# Patient Record
Sex: Female | Born: 1950 | Race: White | Hispanic: No | Marital: Married | Smoking: Never smoker
Health system: Southern US, Community
[De-identification: ages and names within clinical notes are randomized; demographics above are authoritative.]

## PROBLEM LIST (undated history)

## (undated) DIAGNOSIS — R079 Chest pain, unspecified: Secondary | ICD-10-CM

## (undated) DIAGNOSIS — E876 Hypokalemia: Secondary | ICD-10-CM

## (undated) DIAGNOSIS — M1990 Unspecified osteoarthritis, unspecified site: Secondary | ICD-10-CM

## (undated) DIAGNOSIS — I5189 Other ill-defined heart diseases: Secondary | ICD-10-CM

## (undated) HISTORY — PX: KNEE SURGERY: SHX244

## (undated) HISTORY — PX: DIAGNOSTIC LAPAROSCOPY: SUR761

## (undated) HISTORY — PX: ROTATOR CUFF REPAIR: SHX139

---

## 1998-12-14 ENCOUNTER — Other Ambulatory Visit: Admission: RE | Admit: 1998-12-14 | Discharge: 1998-12-14 | Payer: Self-pay | Admitting: Obstetrics & Gynecology

## 1999-09-30 ENCOUNTER — Encounter: Admission: RE | Admit: 1999-09-30 | Discharge: 1999-09-30 | Payer: Self-pay | Admitting: Internal Medicine

## 1999-09-30 ENCOUNTER — Encounter: Payer: Self-pay | Admitting: Internal Medicine

## 1999-12-07 ENCOUNTER — Other Ambulatory Visit: Admission: RE | Admit: 1999-12-07 | Discharge: 1999-12-07 | Payer: Self-pay | Admitting: Obstetrics and Gynecology

## 2000-11-13 ENCOUNTER — Encounter (INDEPENDENT_AMBULATORY_CARE_PROVIDER_SITE_OTHER): Payer: Self-pay | Admitting: Specialist

## 2000-11-13 ENCOUNTER — Other Ambulatory Visit: Admission: RE | Admit: 2000-11-13 | Discharge: 2000-11-13 | Payer: Self-pay | Admitting: Obstetrics and Gynecology

## 2001-02-01 ENCOUNTER — Other Ambulatory Visit: Admission: RE | Admit: 2001-02-01 | Discharge: 2001-02-01 | Payer: Self-pay | Admitting: Obstetrics and Gynecology

## 2001-04-12 ENCOUNTER — Encounter: Payer: Self-pay | Admitting: Internal Medicine

## 2001-04-12 ENCOUNTER — Encounter: Admission: RE | Admit: 2001-04-12 | Discharge: 2001-04-12 | Payer: Self-pay | Admitting: Internal Medicine

## 2002-02-03 ENCOUNTER — Other Ambulatory Visit: Admission: RE | Admit: 2002-02-03 | Discharge: 2002-02-03 | Payer: Self-pay | Admitting: Obstetrics and Gynecology

## 2003-01-05 ENCOUNTER — Encounter: Admission: RE | Admit: 2003-01-05 | Discharge: 2003-01-05 | Payer: Self-pay | Admitting: Internal Medicine

## 2003-01-05 ENCOUNTER — Encounter: Payer: Self-pay | Admitting: Internal Medicine

## 2003-09-15 ENCOUNTER — Other Ambulatory Visit: Admission: RE | Admit: 2003-09-15 | Discharge: 2003-09-15 | Payer: Self-pay | Admitting: Obstetrics and Gynecology

## 2004-03-04 ENCOUNTER — Encounter: Admission: RE | Admit: 2004-03-04 | Discharge: 2004-03-04 | Payer: Self-pay | Admitting: Internal Medicine

## 2004-11-10 ENCOUNTER — Ambulatory Visit (HOSPITAL_COMMUNITY): Admission: RE | Admit: 2004-11-10 | Discharge: 2004-11-10 | Payer: Self-pay | Admitting: Orthopedic Surgery

## 2004-11-23 ENCOUNTER — Ambulatory Visit (HOSPITAL_BASED_OUTPATIENT_CLINIC_OR_DEPARTMENT_OTHER): Admission: RE | Admit: 2004-11-23 | Discharge: 2004-11-23 | Payer: Self-pay | Admitting: Orthopedic Surgery

## 2004-11-23 ENCOUNTER — Ambulatory Visit (HOSPITAL_COMMUNITY): Admission: RE | Admit: 2004-11-23 | Discharge: 2004-11-23 | Payer: Self-pay | Admitting: Orthopedic Surgery

## 2004-11-23 ENCOUNTER — Encounter (INDEPENDENT_AMBULATORY_CARE_PROVIDER_SITE_OTHER): Payer: Self-pay | Admitting: *Deleted

## 2005-01-05 ENCOUNTER — Ambulatory Visit (HOSPITAL_COMMUNITY): Admission: RE | Admit: 2005-01-05 | Discharge: 2005-01-05 | Payer: Self-pay | Admitting: *Deleted

## 2005-05-01 ENCOUNTER — Encounter: Admission: RE | Admit: 2005-05-01 | Discharge: 2005-05-01 | Payer: Self-pay | Admitting: Internal Medicine

## 2005-07-10 ENCOUNTER — Other Ambulatory Visit: Admission: RE | Admit: 2005-07-10 | Discharge: 2005-07-10 | Payer: Self-pay | Admitting: Obstetrics and Gynecology

## 2005-11-05 ENCOUNTER — Encounter: Admission: RE | Admit: 2005-11-05 | Discharge: 2005-11-05 | Payer: Self-pay | Admitting: Orthopedic Surgery

## 2005-11-15 ENCOUNTER — Encounter (INDEPENDENT_AMBULATORY_CARE_PROVIDER_SITE_OTHER): Payer: Self-pay | Admitting: *Deleted

## 2005-11-15 ENCOUNTER — Ambulatory Visit (HOSPITAL_COMMUNITY): Admission: RE | Admit: 2005-11-15 | Discharge: 2005-11-15 | Payer: Self-pay | Admitting: Obstetrics and Gynecology

## 2006-08-09 ENCOUNTER — Encounter: Admission: RE | Admit: 2006-08-09 | Discharge: 2006-08-09 | Payer: Self-pay | Admitting: Internal Medicine

## 2008-01-22 ENCOUNTER — Encounter: Admission: RE | Admit: 2008-01-22 | Discharge: 2008-01-22 | Payer: Self-pay | Admitting: Obstetrics and Gynecology

## 2009-09-02 ENCOUNTER — Encounter: Admission: RE | Admit: 2009-09-02 | Discharge: 2009-09-02 | Payer: Self-pay | Admitting: Obstetrics and Gynecology

## 2010-04-25 ENCOUNTER — Encounter: Admission: RE | Admit: 2010-04-25 | Discharge: 2010-04-25 | Payer: Self-pay | Admitting: Internal Medicine

## 2010-09-09 ENCOUNTER — Encounter
Admission: RE | Admit: 2010-09-09 | Discharge: 2010-09-09 | Payer: Self-pay | Source: Home / Self Care | Attending: Obstetrics and Gynecology | Admitting: Obstetrics and Gynecology

## 2011-01-20 NOTE — Op Note (Signed)
NAMEGEENA, WEINHOLD NO.:  [id number]   MEDICAL RECORD NO.:  [id number]          PATIENT TYPE:  AMB   LOCATION:  ENDO                         FACILITY:  [facility]   PHYSICIAN:  Georgiana Spinner, M.D.    DATE OF BIRTH:  10-22-1950   DATE OF PROCEDURE:  DATE OF DISCHARGE:                                 OPERATIVE REPORT   PROCEDURE:  Colonoscopy.   INDICATIONS:  Colon cancer screening.   ANESTHESIA:  Demerol 40, Versed 4 mg.   PROCEDURE:  With the patient mildly sedated in the left lateral decubitus  position, the Olympus videoscopic colonoscope was inserted in the rectum,  passed through a somewhat tortuous colon until reached the cecum, identified  by ileocecal valve and appendiceal orifice both of which were photographed.  From this point, the colonoscope was slowly withdrawn taking circumferential  views of the colonic mucosa stopping only in the rectum which appeared  normal on direct and showed hemorrhoids on retroflexed view.  The endoscope  was straightened and withdrawn.  The patient's vital signs and pulse  oximeter remained stable.  The patient tolerated procedure well without  apparent complications.   FINDINGS:  Internal hemorrhoids otherwise an unremarkable colonoscopic  examination to the cecum.   PLAN:  Consider repeat examination in 5-10 years.      GMO/MEDQ  D:  01/05/2005  T:  01/05/2005  Job:  [id number]

## 2011-01-20 NOTE — Op Note (Signed)
[name], SOBIECH NO.:  [id number]   MEDICAL RECORD NO.:  [id number]          PATIENT TYPE:  AMB   LOCATION:  ENDO                         FACILITY:  [facility]   PHYSICIAN:  Georgiana Spinner, M.D.    DATE OF BIRTH:  1951/01/22   DATE OF PROCEDURE:  01/05/2005  DATE OF DISCHARGE:                                 OPERATIVE REPORT   PROCEDURE:  Upper endoscopy.   INDICATIONS:  GERD.   ANESTHESIA:  Demerol 60 mg and Versed 6 mg.   DESCRIPTION OF PROCEDURE:  With the patient mildly sedated in the left  lateral decubitus position, the Olympus videoscope endoscope was inserted in  the mouth and passed under direct vision through the esophagus, which  appeared normal, into the stomach.  The fundus, body, antrum, duodenal bulb  and second portion of the duodenum appeared normal.  From this point, the  endoscope was slowly withdrawn, taking circumferential views of the duodenal  mucosa until the endoscope was pulled back into the stomach and placed in  retroflexion to review the stomach from below.  The endoscope was  straightened and withdrawn, taking circumferential views of the remaining  gastric and esophageal mucosa.  The patient's vital signs and pulse oximetry  remained stable.  The patient tolerated the procedure well without apparent  complications.   FINDINGS:  Unremarkable examination.  No evidence of Barrett's esophagus.   PLAN:  Proceed to colonoscopy.      GMO/MEDQ  D:  01/05/2005  T:  01/05/2005  Job:  [id number]

## 2011-01-20 NOTE — H&P (Signed)
[name], [name] NO.:  [id number]   MEDICAL RECORD NO.:  [id number]          PATIENT TYPE:  AMB   LOCATION:  SDC                           FACILITY:  [facility]   PHYSICIAN:  Huel Cote, M.D. DATE OF BIRTH:  [birth year]   DATE OF PROCEDURE:  DATE OF DISCHARGE:                      STAT - MUST CHANGE TO CORRECT WORK TYPE   HISTORY OF PRESENT ILLNESS:  The patient is a 60 year old gravida 1, para 1,  who is coming in for a scheduled laparoscopic bilateral salpingo-  oophorectomy given an ongoing problem with a persistent to right adnexal  mass which has not resolved despite followup for approximately three to four  months.  The patient currently has some mild pain associated. However, the  persistence of the mass is primary reason for removal.  On ultrasound she  has a right ovarian cyst which appears echo free.  However, it does have  some calcifications.  The patient is very active and is beginning an active  tennis season and wishes removal of this mass prior to tennis season so it  will not cause any problems with undue pain.   PAST MEDICAL HISTORY:  None.   PAST SURGICAL HISTORY:  Knee surgery x5.   PAST OB HISTORY:  Vaginal delivery x1.   PAST GYN HISTORY:  No abnormal Pap smears.   CURRENT MEDICATIONS:  Lipitor for high cholesterol.   ALLERGIES:  PENICILLIN and TETRACYCLINE.   FAMILY HISTORY:  Mother with breast cancer.  No colon cancer.   HEALTH MAINTENANCE:  Up to date.   PHYSICAL EXAMINATION:  VITAL SIGNS:  Weight 147 pounds.  Blood pressure  130/70.  BREASTS:  Normal with no masses, discharge or adenopathy noted.  CARDIAC:  Regular rate and rhythm.  LUNGS:  Clear.  ABDOMEN:  Soft, nontender.  PELVIC:  She has normal external genitalia. Cervix has no lesions.  Uterus  is normal in size.  The right adnexa does have a 4 cm mobile mass.  Left  adnexa feels normal with no masses palpable.   Since November 2006, the patient had been followed  with intermittent  ultrasounds.  However, the mass has persisted and, therefore, the patient is  requesting removal.  We discussed in a menopausal female the pluses and  minuses of retaining her other ovary.  However, the patient is not  interested in retaining either ovary and requests removal of both at the  time of surgery which will be performed as per her wishes given that she is  postmenopausal and has limited ovarian function at this time.  The risks and  benefits of surgery were discussed with the patient in detail including  bleeding, infection and possible damage to bowel and bladder. The patient  understands that should  any of these complications arise, she will require a larger abdominal  incision to correct any problems as well as should there be any uncontrolled  bleeding or difficulty with surgery, she might require surgery.  The patient  understands all these risks and desires to proceed with surgery as stated.      Huel Cote, M.D.  Electronically Signed  KR/MEDQ  D:  11/13/2005  T:  11/13/2005  Job:  [id number]

## 2011-01-20 NOTE — Op Note (Signed)
NAMEJOSETTE, SHIMABUKURO NO.:  [id number]   MEDICAL RECORD NO.:  [id number]          PATIENT TYPE:  AMB   LOCATION:  SDC                           FACILITY:  [facility]   PHYSICIAN:  Huel Cote, M.D. DATE OF BIRTH:  1951-03-20   DATE OF PROCEDURE:  11/15/2005  DATE OF DISCHARGE:                                 OPERATIVE REPORT   PREOPERATIVE DIAGNOSIS:  Persistent right ovarian cyst.   POSTOPERATIVE DIAGNOSIS:  Persistent right ovarian cyst. With the cyst  appearing simple in nature.   PROCEDURE:  Laparoscopic bilateral salpingo-oophorectomy.   SURGEON:  Dr. Huel Cote.   ASSISTANT:  Dr. Hilbert Bible   ANESTHESIA:  General.   SPECIMEN:  Right ovary with cyst was sent, left ovary was sent and both  fallopian tubes were sent.   ESTIMATED BLOOD LOSS:  Minimal   FINDINGS:  There is a large right adnexal cyst which was simple appearing in  nature approximately 5-6 cm in size. Left ovary was atrophic and normal  appearing. The abdomen and pelvis otherwise were within normal limits.   PROCEDURE:  The patient was taken to operating room where she was positioned  in stirrups prior to administration of general anesthesia which was  performed without difficulty. She was in the dorsal lithotomy position and  prepped sterilely. A speculum was placed within the vagina and cervix  identified and a Hulka tenaculum placed within for uterine manipulation. The  bladder was emptied with an in-and-out catheter. Attention was then turned  to the abdomen where a small infraumbilical incision was then made with  scalpel. The Veress needle was then introduced into the incision and  pneumoperitoneum obtained with approximately 1.5 to 2 liters CO2 gas. The  camera was then introduced into the abdomen and no active bleeding noted.  The pelvis and abdomen were inspected with the findings as previously  stated. Two additional trocars were placed in the lower quadrants 5 mm in  size under direct visualization after 0.25% Marcaine solution was injected  at each point. The trocar was then introduced without difficulty. The  patient's right adnexa was in the cul-de-sac secondary to the large cyst  weighing it down. The utero-ovarian ligament and the infundibulopelvic  ligament were then taken down with the tripolar cutting cautery unit with  good hemostasis noted. Thus the right adnexa in entirety was amputated and  placed in the cul-de-sac. In a similar fashion in the left adnexa was  retracted medially and the infundibulopelvic ligament and utero-ovarian  ligament taken down with a tripolar unit. There is no active bleeding noted  and both adnexa were completely amputated at this point. The ureters were  visible well below the field and peristalsing normally. The Endobag was then  placed through the umbilical port the 5 mm camera placed inside. The cyst  and the left adnexa were then placed into the bag and due to the size of the  cyst, it was difficult to completely cinch down the bag however, it was most  of the way secured. At this point the cyst was brought up to the  level of  the umbilicus and attempts were made to aspirate the cyst through the bag  itself and remove the fluid. This was not possible through the incision and  minimal fluid was obtained with the aspiration. Several attempts at this  were made to contain the fluid within the Endobag however, due to the size  of the cyst it was impossible to get into the cyst itself and remove all  fluid. At this point the drainage needle was replaced in through the 5 mm  port under direct visualization. The ovary was punctured through the Endobag  and the fluid was removed somewhat as well as some was definitely spilled  into the peritoneal cavity. As the ovary appeared quite simple in nature and  had no evidence of abnormalities this was not felt to be a significant  problem. The adnexa was eventually reduced  enough in size that it could be  removed through the umbilicus and the remaining ovary fragment was retrieved  through the umbilicus port as well with a second Endobag. At this point the  camera was reintroduced and all pedicles were carefully inspected. What  little fluid was in the cul-de-sac was suctioned and removed from the cul-de-  sac. At this point no active bleeding was noted and therefore both 5 mm  trocars were removed under direct visualization. No active bleeding noted.  The pneumoperitoneum was reduced. The fascia was identified and closed with  0 Vicryl at the umbilicus with no active bleeding noted and this incision  was closed quite well. The additional skin was closed with 3-0 Vicryl in a  subcuticular stitch. Sponge, lap, needle counts were correct x2 and the  patient was taken to the recovery room in stable condition after the Hulka  tenaculum was removed from her vagina.      Huel Cote, M.D.  Electronically Signed     KR/MEDQ  D:  11/15/2005  T:  11/16/2005  Job:  [id number]

## 2011-01-20 NOTE — Op Note (Signed)
[name], MATHES NO.:  [id number]   MEDICAL RECORD NO.:  [id number]          PATIENT TYPE:  AMB   LOCATION:  DSC                          FACILITY:  [facility]   PHYSICIAN:  Cindee Salt, M.D.       DATE OF BIRTH:  [birth year]   DATE OF PROCEDURE:  11/23/2004  DATE OF DISCHARGE:                                 OPERATIVE REPORT   PREOPERATIVE DIAGNOSIS:  Mass, right ring finger.   POSTOPERATIVE DIAGNOSIS:  Mass, right ring finger.   OPERATION:  Excision of mass, right ring finger.   SURGEON:  Cindee Salt, M.D.   ASSISTANTCarolyne Fiscal.   ANESTHESIA:  General.   HISTORY:  The patient is a 60 year old female with a history of a mass over  the PIP area of her right ring finger.  This has become painful for her.  MRI reveals it has being a solid mass.   PROCEDURE:  The patient is brought to the operating room where a general  anesthetic using LMA was given.  She was prepped using DuraPrep, supine  position, right arm free.  The limb was exsanguinated with an Esmarch  bandage.  Tourniquet placed high on the arm was inflated to 250 mmHg and a  transverse incision was made directly over the mass just distal to the PIP  joint and carried down through subcutaneous tissue.  A multi-lobulated, what  appeared to be blood-filled mass was immediately apparent.  This was  separated from the neurovascular bundles with blunt and sharp.  It was  dissected free and bleeding vessels were cauterized.  The entire specimen  was sent to pathology.  The wound was irrigated.  This did not involve the  flexor sheath nor the neurovascular bundle.  The skin was closed interrupted  5-0 nylon sutures.  A sterile compressive dressing was applied to the finger  only.  The patient tolerated the procedure well and was taken to the  recovery room for observation in satisfactory condition.   She is discharged home to return to the [facility] in 1 week  on Vicodin.  It was noted that  she had some feeling in the initial incision  and a metacarpal block was given with 1% Xylocaine without epinephrine; 2 mL  were used.      GK/MEDQ  D:  11/23/2004  T:  11/24/2004  Job:  [id number]

## 2013-12-09 ENCOUNTER — Other Ambulatory Visit: Payer: Self-pay

## 2013-12-09 DIAGNOSIS — Z1231 Encounter for screening mammogram for malignant neoplasm of breast: Secondary | ICD-10-CM

## 2013-12-19 ENCOUNTER — Ambulatory Visit
Admission: RE | Admit: 2013-12-19 | Discharge: 2013-12-19 | Disposition: A | Discharge status: Home / Self Care | Payer: BC Managed Care – PPO | Source: Ambulatory Visit

## 2013-12-19 DIAGNOSIS — Z1231 Encounter for screening mammogram for malignant neoplasm of breast: Secondary | ICD-10-CM

## 2014-11-04 ENCOUNTER — Other Ambulatory Visit (HOSPITAL_COMMUNITY): Payer: Self-pay | Admitting: Radiology

## 2014-11-04 DIAGNOSIS — R0600 Dyspnea, unspecified: Secondary | ICD-10-CM

## 2014-11-05 ENCOUNTER — Other Ambulatory Visit: Payer: Self-pay | Admitting: Internal Medicine

## 2014-11-05 DIAGNOSIS — M542 Cervicalgia: Secondary | ICD-10-CM

## 2014-11-06 ENCOUNTER — Ambulatory Visit (HOSPITAL_COMMUNITY)
Admission: RE | Admit: 2014-11-06 | Discharge: 2014-11-06 | Disposition: A | Discharge status: Home / Self Care | Payer: BLUE CROSS/BLUE SHIELD | Source: Ambulatory Visit | Attending: Internal Medicine | Admitting: Internal Medicine

## 2014-11-06 DIAGNOSIS — R0600 Dyspnea, unspecified: Secondary | ICD-10-CM | POA: Insufficient documentation

## 2014-11-06 MED ORDER — ALBUTEROL SULFATE (2.5 MG/3ML) 0.083% IN NEBU
2.5000 mg | INHALATION_SOLUTION | Freq: Once | RESPIRATORY_TRACT | Status: AC
  Administered 2014-11-06: 2.5 mg via RESPIRATORY_TRACT

## 2014-11-09 ENCOUNTER — Ambulatory Visit
Admission: RE | Admit: 2014-11-09 | Discharge: 2014-11-09 | Disposition: A | Discharge status: Home / Self Care | Payer: BLUE CROSS/BLUE SHIELD | Source: Ambulatory Visit | Attending: Internal Medicine | Admitting: Internal Medicine

## 2014-11-09 DIAGNOSIS — M542 Cervicalgia: Secondary | ICD-10-CM

## 2014-11-09 LAB — PULMONARY FUNCTION TEST
DL/VA % PRED: 73 %
DL/VA: 3.8 ml/min/mmHg/L
DLCO UNC % PRED: 71 %
DLCO unc: 20.3 ml/min/mmHg
FEF 25-75 POST: 2.83 L/s
FEF 25-75 PRE: 2.55 L/s
FEF2575-%CHANGE-POST: 11 %
FEF2575-%PRED-POST: 116 %
FEF2575-%PRED-PRE: 105 %
FEV1-%CHANGE-POST: 2 %
FEV1-%PRED-PRE: 96 %
FEV1-%Pred-Post: 98 %
FEV1-POST: 2.74 L
FEV1-PRE: 2.68 L
FEV1FVC-%Change-Post: 4 %
FEV1FVC-%PRED-PRE: 102 %
FEV6-%Change-Post: -1 %
FEV6-%PRED-POST: 93 %
FEV6-%Pred-Pre: 95 %
FEV6-POST: 3.27 L
FEV6-Pre: 3.33 L
FEV6FVC-%CHANGE-POST: 0 %
FEV6FVC-%PRED-PRE: 104 %
FEV6FVC-%Pred-Post: 104 %
FVC-%Change-Post: -2 %
FVC-%PRED-POST: 90 %
FVC-%Pred-Pre: 92 %
FVC-POST: 3.27 L
FVC-Pre: 3.34 L
POST FEV1/FVC RATIO: 84 %
PRE FEV1/FVC RATIO: 80 %
Post FEV6/FVC ratio: 100 %
Pre FEV6/FVC Ratio: 100 %
RV % pred: 110 %
RV: 2.41 L
TLC % pred: 103 %
TLC: 5.7 L

## 2015-01-29 ENCOUNTER — Other Ambulatory Visit (INDEPENDENT_AMBULATORY_CARE_PROVIDER_SITE_OTHER): Payer: Self-pay | Admitting: Otolaryngology

## 2015-01-29 DIAGNOSIS — E041 Nontoxic single thyroid nodule: Secondary | ICD-10-CM

## 2015-02-03 ENCOUNTER — Ambulatory Visit
Admission: RE | Admit: 2015-02-03 | Discharge: 2015-02-03 | Disposition: A | Discharge status: Home / Self Care | Payer: BLUE CROSS/BLUE SHIELD | Source: Ambulatory Visit | Attending: Otolaryngology | Admitting: Otolaryngology

## 2015-02-03 ENCOUNTER — Other Ambulatory Visit (INDEPENDENT_AMBULATORY_CARE_PROVIDER_SITE_OTHER): Payer: Self-pay | Admitting: Otolaryngology

## 2015-02-03 DIAGNOSIS — E041 Nontoxic single thyroid nodule: Secondary | ICD-10-CM

## 2016-06-16 NOTE — H&P (Signed)
TOTAL KNEE ADMISSION H&P     Patient is being admitted for bilateral total knee arthroplasty.  Subjective:  Chief Complaint:   Bilateral knee primary OA / pain  HPI: [name], 65 y.o. female, has a history of pain and functional disability in the bilateral knees due to trauma and arthritis and has failed non-surgical conservative treatments for greater than 12 weeks to include NSAID's and/or analgesics, corticosteriod injections and activity modification.  Onset of symptoms was gradual, starting 30+ years ago with gradually worsening course since that time. The patient noted prior procedures on the knee to include  arthroscopy, menisectomy, ACL reconstruction and multiple others on the bilateral knees.  Patient currently rates pain in the bilateral knee(s) at 7 out of 10 with activity. Patient has night pain, worsening of pain with activity and weight bearing, pain that interferes with activities of daily living, pain with passive range of motion, crepitus and joint swelling.  Patient has evidence of periarticular osteophytes and joint space narrowing by imaging studies.  There is no active infection.  Risks, benefits and expectations were discussed with the patient.  Risks including but not limited to the risk of anesthesia, blood clots, nerve damage, blood vessel damage, failure of the prosthesis, infection and up to and including death.  Patient understand the risks, benefits and expectations and wishes to proceed with surgery.   PCP: [name], MD  D/C Plans:      Home with HHPT  Post-op Meds:       No Rx given  Tranexamic Acid:      To be given - IV   Decadron:      Is to be given  FYI:     Xarelto then ASA (bilateral)  Oxycodone (bilateral)     Past Medical History:  Diagnosis Date  . Arthritis    osteoarthritis knees mostly    Past Surgical History:  Procedure Laterality Date  . DIAGNOSTIC LAPAROSCOPY     ovarian cystectomy/ BSO done.  Marland Kitchen KNEE SURGERY Bilateral    ACL x2, meniscectomy x3, open patella(bone from other knee)  . ROTATOR CUFF REPAIR Right     No prescriptions prior to admission.   Allergies  Allergen Reactions  . Penicillins Anaphylaxis and Swelling    Has patient had a PCN reaction causing immediate rash, facial/tongue/throat swelling, SOB or lightheadedness with hypotension:Yes Has patient had a PCN reaction causing severe rash involving mucus membranes or skin necrosis:No Has patient had a PCN reaction that required hospitalization:No Has patient had a PCN reaction occurring within the last 10 years:No If all of the above answers are "NO", then may proceed with Cephalosporin use.   . Tetracyclines & Related Hives     Social History  Substance Use Topics  . Smoking status: Never Smoker  . Smokeless tobacco: Never Used  . Alcohol use Yes     Comment: social x3-4 week       Review of Systems  Constitutional: Negative.   HENT: Negative.   Eyes: Negative.   Respiratory: Negative.   Cardiovascular: Negative.   Gastrointestinal: Negative.   Genitourinary: Negative.   Musculoskeletal: Positive for joint pain.  Skin: Negative.   Neurological: Negative.   Endo/Heme/Allergies: Negative.   Psychiatric/Behavioral: Negative.     Objective:  Physical Exam  Constitutional: She is oriented to person, place, and time. She appears well-developed.  HENT:  Head: Normocephalic.  Eyes: Pupils are equal, round, and reactive to light.  Neck: Neck supple. No JVD present. No tracheal  deviation present. No thyromegaly present.  Cardiovascular: Normal rate, regular rhythm, normal heart sounds and intact distal pulses.   Respiratory: Effort normal and breath sounds normal. No respiratory distress. She has no wheezes.  GI: Soft. There is no tenderness. There is no guarding.  Musculoskeletal:       Right knee: She exhibits decreased range of motion, swelling and bony tenderness. She exhibits no ecchymosis, no deformity, no laceration and  no erythema. Tenderness found.       Left knee: She exhibits decreased range of motion, swelling and bony tenderness. She exhibits no ecchymosis, no deformity, no laceration and no erythema. Tenderness found.  Lymphadenopathy:    She has no cervical adenopathy.  Neurological: She is alert and oriented to person, place, and time.  Skin: Skin is warm and dry.  Psychiatric: She has a normal mood and affect.      Imaging Review Plain radiographs demonstrate severe degenerative joint disease of the bilateral knees. The overall alignment is neutral. The bone quality appears to be good for age and reported activity level.  Assessment/Plan:  End stage arthritis, bilateral knees  The patient history, physical examination, clinical judgment of the provider and imaging studies are consistent with end stage degenerative joint disease of the bilateral knees and total knee arthroplasty is deemed medically necessary. The treatment options including medical management, injection therapy arthroscopy and arthroplasty were discussed at length. The risks and benefits of total knee arthroplasty were presented and reviewed. The risks due to aseptic loosening, infection, stiffness, patella tracking problems, thromboembolic complications and other imponderables were discussed. The patient acknowledged the explanation, agreed to proceed with the plan and consent was signed. Patient is being admitted for inpatient treatment for surgery, pain control, PT, OT, prophylactic antibiotics, VTE prophylaxis, progressive ambulation and ADL's and discharge planning. The patient is planning to be discharged home with home health services.      [place] P[place] Reve Crocket   PA-C  06/30/2016, 8:35 AM

## 2016-06-26 NOTE — Patient Instructions (Addendum)
[name]  06/26/2016   Your procedure is scheduled on: 07-03-16  Report to [place]  Entrance take [facility]  elevators to 3rd floor to  [place] at   [place] AM.  Call this number if you have problems the morning of surgery [phone]   Remember: ONLY 1 PERSON MAY GO WITH YOU TO SHORT STAY TO GET  READY MORNING OF [name]  Do not eat food or drink liquids :After Midnight.     Take these medicines the morning of surgery with A SIP OF WATER: None. DO NOT TAKE ANY DIABETIC MEDICATIONS DAY OF YOUR SURGERY                               You may not have any metal on your body including hair pins and              piercings  Do not wear jewelry, make-up, lotions, powders or perfumes, deodorant             Do not wear nail polish.  Do not shave  48 hours prior to surgery.              Men may shave face and neck.   Do not bring valuables to the hospital. [place].  Contacts, dentures or bridgework may not be worn into surgery.  Leave suitcase in the car. After surgery it may be brought to your room.     Patients discharged the day of surgery will not be allowed to drive home.  Name and phone number of your driver: [name] [phone] cell  Special Instructions: N/A              Please read over the following fact sheets you were given: _____________________________________________________________________             [facility] - Preparing for Surgery Before surgery, you can play an important role.  Because skin is not sterile, your skin needs to be as free of germs as possible.  You can reduce the number of germs on your skin by washing with CHG (chlorahexidine gluconate) soap before surgery.  CHG is an antiseptic cleaner which kills germs and bonds with the skin to continue killing germs even after washing. Please DO NOT use if you have an allergy to CHG or antibacterial soaps.  If  your skin becomes reddened/irritated stop using the CHG and inform your nurse when you arrive at Short Stay. Do not shave (including legs and underarms) for at least 48 hours prior to the first CHG shower.  You may shave your face/neck. Please follow these instructions carefully:  1.  Shower with CHG Soap the night before surgery and the  morning of Surgery.  2.  If you choose to wash your hair, wash your hair first as usual with your  normal  shampoo.  3.  After you shampoo, rinse your hair and body thoroughly to remove the  shampoo.                           4.  Use CHG as you would any other liquid soap.  You can apply chg directly  to the  skin and wash                       Gently with a scrungie or clean washcloth.  5.  Apply the CHG Soap to your body ONLY FROM THE NECK DOWN.   Do not use on face/ open                           Wound or open sores. Avoid contact with eyes, ears mouth and genitals (private parts).                       Wash face,  Genitals (private parts) with your normal soap.             6.  Wash thoroughly, paying special attention to the area where your surgery  will be performed.  7.  Thoroughly rinse your body with warm water from the neck down.  8.  DO NOT shower/wash with your normal soap after using and rinsing off  the CHG Soap.                9.  Pat yourself dry with a clean towel.            10.  Wear clean pajamas.            11.  Place clean sheets on your bed the night of your first shower and do not  sleep with pets. Day of Surgery : Do not apply any lotions/deodorants the morning of surgery.  Please wear clean clothes to the hospital/surgery center.  FAILURE TO FOLLOW THESE INSTRUCTIONS MAY RESULT IN THE CANCELLATION OF YOUR SURGERY PATIENT SIGNATURE_________________________________  NURSE SIGNATURE__________________________________  ________________________________________________________________________   [name]  An incentive  spirometer is a tool that can help keep your lungs clear and active. This tool measures how well you are filling your lungs with each breath. Taking long deep breaths may help reverse or decrease the chance of developing breathing (pulmonary) problems (especially infection) following:  A long period of time when you are unable to move or be active. BEFORE THE PROCEDURE   If the spirometer includes an indicator to show your best effort, your nurse or respiratory therapist will set it to a desired goal.  If possible, sit up straight or lean slightly forward. Try not to slouch.  Hold the incentive spirometer in an upright position. INSTRUCTIONS FOR USE  1. Sit on the edge of your bed if possible, or sit up as far as you can in bed or on a chair. 2. Hold the incentive spirometer in an upright position. 3. Breathe out normally. 4. Place the mouthpiece in your mouth and seal your lips tightly around it. 5. Breathe in slowly and as deeply as possible, raising the piston or the ball toward the top of the column. 6. Hold your breath for 3-5 seconds or for as long as possible. Allow the piston or ball to fall to the bottom of the column. 7. Remove the mouthpiece from your mouth and breathe out normally. 8. Rest for a few seconds and repeat Steps 1 through 7 at least 10 times every 1-2 hours when you are awake. Take your time and take a few normal breaths between deep breaths. 9. The spirometer may include an indicator to show your best effort. Use the indicator as a goal to work toward during each repetition. 10. After each set of  10 deep breaths, practice coughing to be sure your lungs are clear. If you have an incision (the cut made at the time of surgery), support your incision when coughing by placing a pillow or rolled up towels firmly against it. Once you are able to get out of bed, walk around indoors and cough well. You may stop using the incentive spirometer when instructed by your caregiver.   RISKS AND COMPLICATIONS  Take your time so you do not get dizzy or light-headed.  If you are in pain, you may need to take or ask for pain medication before doing incentive spirometry. It is harder to take a deep breath if you are having pain. AFTER USE  Rest and breathe slowly and easily.  It can be helpful to keep track of a log of your progress. Your caregiver can provide you with a simple table to help with this. If you are using the spirometer at home, follow these instructions: [place] IF:   You are having difficultly using the spirometer.  You have trouble using the spirometer as often as instructed.  Your pain medication is not giving enough relief while using the spirometer.  You develop fever of 100.5 F (38.1 C) or higher. SEEK IMMEDIATE MEDICAL CARE IF:   You cough up bloody sputum that had not been present before.  You develop fever of 102 F (38.9 C) or greater.  You develop worsening pain at or near the incision site. MAKE SURE YOU:   Understand these instructions.  Will watch your condition.  Will get help right away if you are not doing well or get worse. Document Released: 01/01/2007 Document Revised: 11/13/2011 Document Reviewed: 03/04/2007 Exit[employer] Patient Information 2014 [employer], [place].   ________________________________________________________________________  WHAT IS A BLOOD TRANSFUSION? Blood Transfusion Information  A transfusion is the replacement of blood or some of its parts. Blood is made up of multiple cells which provide different functions.  Red blood cells carry oxygen and are used for blood loss replacement.  White blood cells fight against infection.  Platelets control bleeding.  Plasma helps clot blood.  Other blood products are available for specialized needs, such as hemophilia or other clotting disorders. BEFORE THE TRANSFUSION  Who gives blood for transfusions?   Healthy volunteers who are fully evaluated  to make sure their blood is safe. This is blood bank blood. Transfusion therapy is the safest it has ever been in the practice of medicine. Before blood is taken from a donor, a complete history is taken to make sure that person has no history of diseases nor engages in risky social behavior (examples are intravenous drug use or sexual activity with multiple partners). The donor's travel history is screened to minimize risk of transmitting infections, such as malaria. The donated blood is tested for signs of infectious diseases, such as HIV and hepatitis. The blood is then tested to be sure it is compatible with you in order to minimize the chance of a transfusion reaction. If you or a relative donates blood, this is often done in anticipation of surgery and is not appropriate for emergency situations. It takes many days to process the donated blood. RISKS AND COMPLICATIONS Although transfusion therapy is very safe and saves many lives, the main dangers of transfusion include:   Getting an infectious disease.  Developing a transfusion reaction. This is an allergic reaction to something in the blood you were given. Every precaution is taken to prevent this. The decision to have a blood  transfusion has been considered carefully by your caregiver before blood is given. Blood is not given unless the benefits outweigh the risks. AFTER THE TRANSFUSION  Right after receiving a blood transfusion, you will usually feel much better and more energetic. This is especially true if your red blood cells have gotten low (anemic). The transfusion raises the level of the red blood cells which carry oxygen, and this usually causes an energy increase.  The nurse administering the transfusion will monitor you carefully for complications. HOME CARE INSTRUCTIONS  No special instructions are needed after a transfusion. You may find your energy is better. Speak with your caregiver about any limitations on activity for  underlying diseases you may have. SEEK MEDICAL CARE IF:   Your condition is not improving after your transfusion.  You develop redness or irritation at the intravenous (IV) site. SEEK IMMEDIATE MEDICAL CARE IF:  Any of the following symptoms occur over the next 12 hours:  Shaking chills.  You have a temperature by mouth above 102 F (38.9 C), not controlled by medicine.  Chest, back, or muscle pain.  People around you feel you are not acting correctly or are confused.  Shortness of breath or difficulty breathing.  Dizziness and fainting.  You get a rash or develop hives.  You have a decrease in urine output.  Your urine turns a dark color or changes to pink, red, or brown. Any of the following symptoms occur over the next 10 days:  You have a temperature by mouth above 102 F (38.9 C), not controlled by medicine.  Shortness of breath.  Weakness after normal activity.  The white part of the eye turns yellow (jaundice).  You have a decrease in the amount of urine or are urinating less often.  Your urine turns a dark color or changes to pink, red, or brown. Document Released: 08/18/2000 Document Revised: 11/13/2011 Document Reviewed: 04/06/2008 [facility] Patient Information 2014 [place], [place].  _______________________________________________________________________

## 2016-06-27 ENCOUNTER — Encounter (HOSPITAL_COMMUNITY)
Admission: RE | Admit: 2016-06-27 | Discharge: 2016-06-27 | Disposition: A | Discharge status: Home / Self Care | Payer: BLUE CROSS/BLUE SHIELD | Source: Ambulatory Visit | Attending: Orthopedic Surgery | Admitting: Orthopedic Surgery

## 2016-06-27 ENCOUNTER — Encounter (HOSPITAL_COMMUNITY): Payer: Self-pay

## 2016-06-27 DIAGNOSIS — M170 Bilateral primary osteoarthritis of knee: Secondary | ICD-10-CM | POA: Diagnosis not present

## 2016-06-27 DIAGNOSIS — Z01812 Encounter for preprocedural laboratory examination: Secondary | ICD-10-CM | POA: Insufficient documentation

## 2016-06-27 HISTORY — DX: Unspecified osteoarthritis, unspecified site: M19.90

## 2016-06-27 LAB — CBC
HEMATOCRIT: 44.5 % (ref 36.0–46.0)
Hemoglobin: 14.8 g/dL (ref 12.0–15.0)
MCH: 30.6 pg (ref 26.0–34.0)
MCHC: 33.3 g/dL (ref 30.0–36.0)
MCV: 92.1 fL (ref 78.0–100.0)
PLATELETS: 211 10*3/uL (ref 150–400)
RBC: 4.83 MIL/uL (ref 3.87–5.11)
RDW: 12.6 % (ref 11.5–15.5)
WBC: 5.6 10*3/uL (ref 4.0–10.5)

## 2016-06-27 LAB — SURGICAL PCR SCREEN
MRSA, PCR: NEGATIVE
STAPHYLOCOCCUS AUREUS: POSITIVE — AB

## 2016-06-27 LAB — ABO/RH: ABO/RH(D): A POS

## 2016-06-27 NOTE — Pre-Procedure Instructions (Signed)
06-27-16 1610 Positive Staph aureus -pt. Aware to use Mupirocin Ointment as directed. Message to Dr. Aurea Graff office pod- [phone].

## 2016-06-27 NOTE — Pre-Procedure Instructions (Signed)
EKG 9'17 with chart . Clearance note Dr. Maudie Mercury with chart 05-16-16.

## 2016-07-02 NOTE — Anesthesia Preprocedure Evaluation (Addendum)
Anesthesia Evaluation  Patient identified by MRN, date of birth, ID band Patient awake    Reviewed: Allergy & Precautions, H&P , NPO status , Patient's Chart, lab work & pertinent test results  Airway Mallampati: II  TM Distance: >3 FB Neck ROM: Full    Dental no notable dental hx. (+) Teeth Intact, Dental Advisory Given   Pulmonary neg pulmonary ROS,    Pulmonary exam normal breath sounds clear to auscultation       Cardiovascular negative cardio ROS   Rhythm:Regular Rate:Normal     Neuro/Psych negative neurological ROS  negative psych ROS   GI/Hepatic negative GI ROS, Neg liver ROS,   Endo/Other  negative endocrine ROS  Renal/GU negative Renal ROS  negative genitourinary   Musculoskeletal  (+) Arthritis , Osteoarthritis,    Abdominal   Peds  Hematology negative hematology ROS (+)   Anesthesia Other Findings   Reproductive/Obstetrics negative OB ROS                            Anesthesia Physical Anesthesia Plan  ASA: II  Anesthesia Plan: MAC and Spinal   Post-op Pain Management:    Induction: Intravenous  Airway Management Planned: Simple Face Mask  Additional Equipment:   Intra-op Plan:   Post-operative Plan:   Informed Consent: I have reviewed the patients History and Physical, chart, labs and discussed the procedure including the risks, benefits and alternatives for the proposed anesthesia with the patient or authorized representative who has indicated his/her understanding and acceptance.   Dental advisory given  Plan Discussed with: CRNA  Anesthesia Plan Comments:         Anesthesia Quick Evaluation

## 2016-07-03 ENCOUNTER — Encounter (HOSPITAL_COMMUNITY): Payer: Self-pay | Admitting: *Deleted

## 2016-07-03 ENCOUNTER — Encounter (HOSPITAL_COMMUNITY)
Admission: RE | Disposition: A | Discharge status: Rehabilitation Facility | Payer: Self-pay | Source: Ambulatory Visit | Attending: Orthopedic Surgery

## 2016-07-03 ENCOUNTER — Inpatient Hospital Stay (HOSPITAL_COMMUNITY)
Admission: RE | Admit: 2016-07-03 | Discharge: 2016-07-07 | DRG: 462 | Disposition: A | Discharge status: Rehabilitation Facility | Payer: BLUE CROSS/BLUE SHIELD | Source: Ambulatory Visit | Attending: Orthopedic Surgery | Admitting: Orthopedic Surgery

## 2016-07-03 ENCOUNTER — Inpatient Hospital Stay (HOSPITAL_COMMUNITY): Payer: BLUE CROSS/BLUE SHIELD | Admitting: Anesthesiology

## 2016-07-03 DIAGNOSIS — M65861 Other synovitis and tenosynovitis, right lower leg: Secondary | ICD-10-CM | POA: Diagnosis present

## 2016-07-03 DIAGNOSIS — Z880 Allergy status to penicillin: Secondary | ICD-10-CM | POA: Diagnosis not present

## 2016-07-03 DIAGNOSIS — Z96653 Presence of artificial knee joint, bilateral: Secondary | ICD-10-CM | POA: Diagnosis not present

## 2016-07-03 DIAGNOSIS — N3000 Acute cystitis without hematuria: Secondary | ICD-10-CM | POA: Diagnosis not present

## 2016-07-03 DIAGNOSIS — D62 Acute posthemorrhagic anemia: Secondary | ICD-10-CM | POA: Diagnosis not present

## 2016-07-03 DIAGNOSIS — Z79891 Long term (current) use of opiate analgesic: Secondary | ICD-10-CM

## 2016-07-03 DIAGNOSIS — Z7982 Long term (current) use of aspirin: Secondary | ICD-10-CM

## 2016-07-03 DIAGNOSIS — M170 Bilateral primary osteoarthritis of knee: Secondary | ICD-10-CM | POA: Diagnosis present

## 2016-07-03 DIAGNOSIS — Z888 Allergy status to other drugs, medicaments and biological substances status: Secondary | ICD-10-CM | POA: Diagnosis not present

## 2016-07-03 DIAGNOSIS — Z96659 Presence of unspecified artificial knee joint: Secondary | ICD-10-CM

## 2016-07-03 DIAGNOSIS — Z79899 Other long term (current) drug therapy: Secondary | ICD-10-CM

## 2016-07-03 DIAGNOSIS — G8918 Other acute postprocedural pain: Secondary | ICD-10-CM | POA: Diagnosis not present

## 2016-07-03 DIAGNOSIS — M65862 Other synovitis and tenosynovitis, left lower leg: Secondary | ICD-10-CM | POA: Diagnosis present

## 2016-07-03 DIAGNOSIS — R269 Unspecified abnormalities of gait and mobility: Secondary | ICD-10-CM | POA: Diagnosis not present

## 2016-07-03 DIAGNOSIS — K5903 Drug induced constipation: Secondary | ICD-10-CM | POA: Diagnosis not present

## 2016-07-03 DIAGNOSIS — M79609 Pain in unspecified limb: Secondary | ICD-10-CM | POA: Diagnosis not present

## 2016-07-03 HISTORY — PX: TOTAL KNEE ARTHROPLASTY: SHX125

## 2016-07-03 HISTORY — PX: HARDWARE REMOVAL: SHX979

## 2016-07-03 SURGERY — ARTHROPLASTY, KNEE, BILATERAL, TOTAL
Anesthesia: Monitor Anesthesia Care | Site: Knee | Laterality: Left

## 2016-07-03 MED ORDER — ONDANSETRON HCL 4 MG/2ML IJ SOLN
INTRAMUSCULAR | Status: DC | PRN
  Administered 2016-07-03: 4 mg via INTRAVENOUS

## 2016-07-03 MED ORDER — SODIUM CHLORIDE 0.9 % IR SOLN
Status: DC | PRN
  Administered 2016-07-03: 2000 mL

## 2016-07-03 MED ORDER — METHOCARBAMOL 1000 MG/10ML IJ SOLN
500.0000 mg | Freq: Four times a day (QID) | INTRAMUSCULAR | Status: DC | PRN
  Administered 2016-07-03: 500 mg via INTRAVENOUS
  Filled 2016-07-03: qty 5
  Filled 2016-07-03: qty 550

## 2016-07-03 MED ORDER — ONDANSETRON HCL 4 MG PO TABS: 4.0000 mg | ORAL_TABLET | Freq: Four times a day (QID) | ORAL | Status: DC | PRN

## 2016-07-03 MED ORDER — STERILE WATER FOR IRRIGATION IR SOLN
Status: DC | PRN
  Administered 2016-07-03: 2000 mL

## 2016-07-03 MED ORDER — VANCOMYCIN HCL IN DEXTROSE 1-5 GM/200ML-% IV SOLN
1000.0000 mg | Freq: Two times a day (BID) | INTRAVENOUS | Status: AC
  Administered 2016-07-03: 1000 mg via INTRAVENOUS
  Filled 2016-07-03: qty 200

## 2016-07-03 MED ORDER — FERROUS SULFATE 325 (65 FE) MG PO TABS
325.0000 mg | ORAL_TABLET | Freq: Three times a day (TID) | ORAL | Status: DC
  Administered 2016-07-05 (×3): 325 mg via ORAL
  Filled 2016-07-03 (×4): qty 1

## 2016-07-03 MED ORDER — VANCOMYCIN HCL IN DEXTROSE 1-5 GM/200ML-% IV SOLN
1000.0000 mg | INTRAVENOUS | Status: AC
Start: 2016-07-03 — End: 2016-07-03
  Administered 2016-07-03: 1000 mg via INTRAVENOUS
  Filled 2016-07-03: qty 200

## 2016-07-03 MED ORDER — PROPOFOL 10 MG/ML IV BOLUS
INTRAVENOUS | Status: AC
  Filled 2016-07-03: qty 60

## 2016-07-03 MED ORDER — OXYCODONE HCL 5 MG PO TABS
5.0000 mg | ORAL_TABLET | ORAL | Status: DC
  Administered 2016-07-03 – 2016-07-04 (×5): 10 mg via ORAL
  Administered 2016-07-04: 15 mg via ORAL
  Administered 2016-07-04: 10 mg via ORAL
  Administered 2016-07-04: 5 mg via ORAL
  Administered 2016-07-05 (×6): 10 mg via ORAL
  Administered 2016-07-05: 15 mg via ORAL
  Administered 2016-07-06 – 2016-07-07 (×8): 10 mg via ORAL
  Filled 2016-07-03 (×2): qty 2
  Filled 2016-07-03: qty 3
  Filled 2016-07-03 (×11): qty 2
  Filled 2016-07-03: qty 3
  Filled 2016-07-03 (×3): qty 2
  Filled 2016-07-03: qty 1
  Filled 2016-07-03 (×4): qty 2

## 2016-07-03 MED ORDER — MAGNESIUM CITRATE PO SOLN: 1.0000 | Freq: Once | ORAL | Status: DC | PRN

## 2016-07-03 MED ORDER — FENTANYL CITRATE (PF) 100 MCG/2ML IJ SOLN
INTRAMUSCULAR | Status: DC | PRN
  Administered 2016-07-03: 100 ug via INTRAVENOUS

## 2016-07-03 MED ORDER — BUPIVACAINE HCL (PF) 0.25 % IJ SOLN
INTRAMUSCULAR | Status: AC
  Filled 2016-07-03: qty 30

## 2016-07-03 MED ORDER — PROPOFOL 10 MG/ML IV BOLUS
INTRAVENOUS | Status: AC
  Filled 2016-07-03: qty 20

## 2016-07-03 MED ORDER — PHENOL 1.4 % MT LIQD
1.0000 | OROMUCOSAL | Status: DC | PRN
  Filled 2016-07-03: qty 177

## 2016-07-03 MED ORDER — BUPIVACAINE HCL (PF) 0.5 % IJ SOLN
INTRAMUSCULAR | Status: AC
  Filled 2016-07-03: qty 30

## 2016-07-03 MED ORDER — SODIUM CHLORIDE 0.9 % IV SOLN
INTRAVENOUS | Status: DC
  Administered 2016-07-03 – 2016-07-06 (×4): via INTRAVENOUS
  Filled 2016-07-03 (×14): qty 1000

## 2016-07-03 MED ORDER — LIDOCAINE 2% (20 MG/ML) 5 ML SYRINGE
INTRAMUSCULAR | Status: DC | PRN
  Administered 2016-07-03: 60 mg via INTRAVENOUS

## 2016-07-03 MED ORDER — ALUM & MAG HYDROXIDE-SIMETH 200-200-20 MG/5ML PO SUSP
30.0000 mL | ORAL | Status: DC | PRN
  Administered 2016-07-05 (×2): 30 mL via ORAL
  Filled 2016-07-03 (×2): qty 30

## 2016-07-03 MED ORDER — MIDAZOLAM HCL 5 MG/5ML IJ SOLN
INTRAMUSCULAR | Status: DC | PRN
  Administered 2016-07-03: 2 mg via INTRAVENOUS

## 2016-07-03 MED ORDER — ONDANSETRON HCL 4 MG/2ML IJ SOLN
INTRAMUSCULAR | Status: AC
  Filled 2016-07-03: qty 2

## 2016-07-03 MED ORDER — KETOROLAC TROMETHAMINE 30 MG/ML IJ SOLN
INTRAMUSCULAR | Status: AC
  Filled 2016-07-03: qty 1

## 2016-07-03 MED ORDER — SODIUM CHLORIDE 0.9 % IR SOLN
Status: DC | PRN
  Administered 2016-07-03: 1000 mL

## 2016-07-03 MED ORDER — PHENYLEPHRINE HCL 10 MG/ML IJ SOLN
INTRAMUSCULAR | Status: DC | PRN
  Administered 2016-07-03 (×3): 80 ug via INTRAVENOUS

## 2016-07-03 MED ORDER — BUPIVACAINE HCL (PF) 0.25 % IJ SOLN
INTRAMUSCULAR | Status: DC | PRN
  Administered 2016-07-03 (×2): 15 mL

## 2016-07-03 MED ORDER — HYDROMORPHONE HCL 1 MG/ML IJ SOLN
0.5000 mg | INTRAMUSCULAR | Status: DC | PRN
  Administered 2016-07-03 (×2): 0.5 mg via INTRAVENOUS
  Administered 2016-07-03: 2 mg via INTRAVENOUS
  Administered 2016-07-04 – 2016-07-05 (×2): 1 mg via INTRAVENOUS
  Administered 2016-07-06: 0.5 mg via INTRAVENOUS
  Administered 2016-07-06: 1 mg via INTRAVENOUS
  Filled 2016-07-03 (×5): qty 1
  Filled 2016-07-03: qty 2
  Filled 2016-07-03: qty 1

## 2016-07-03 MED ORDER — ONDANSETRON HCL 4 MG/2ML IJ SOLN
4.0000 mg | Freq: Four times a day (QID) | INTRAMUSCULAR | Status: DC | PRN
  Administered 2016-07-03 – 2016-07-04 (×2): 4 mg via INTRAVENOUS
  Filled 2016-07-03 (×3): qty 2

## 2016-07-03 MED ORDER — METOCLOPRAMIDE HCL 5 MG PO TABS: 5.0000 mg | ORAL_TABLET | Freq: Three times a day (TID) | ORAL | Status: DC | PRN

## 2016-07-03 MED ORDER — METHOCARBAMOL 500 MG PO TABS
500.0000 mg | ORAL_TABLET | Freq: Four times a day (QID) | ORAL | Status: DC | PRN
  Administered 2016-07-03 – 2016-07-07 (×12): 500 mg via ORAL
  Filled 2016-07-03 (×12): qty 1

## 2016-07-03 MED ORDER — POLYETHYLENE GLYCOL 3350 17 G PO PACK
17.0000 g | PACK | Freq: Two times a day (BID) | ORAL | Status: DC
  Administered 2016-07-03 – 2016-07-06 (×5): 17 g via ORAL
  Filled 2016-07-03 (×6): qty 1

## 2016-07-03 MED ORDER — ACETAMINOPHEN 10 MG/ML IV SOLN
1000.0000 mg | Freq: Once | INTRAVENOUS | Status: AC
  Administered 2016-07-03: 1000 mg via INTRAVENOUS

## 2016-07-03 MED ORDER — DEXAMETHASONE SODIUM PHOSPHATE 10 MG/ML IJ SOLN
10.0000 mg | Freq: Once | INTRAMUSCULAR | Status: AC
  Administered 2016-07-04: 10 mg via INTRAVENOUS
  Filled 2016-07-03: qty 1

## 2016-07-03 MED ORDER — BISACODYL 10 MG RE SUPP: 10.0000 mg | Freq: Every day | RECTAL | Status: DC | PRN

## 2016-07-03 MED ORDER — LIDOCAINE 2% (20 MG/ML) 5 ML SYRINGE
INTRAMUSCULAR | Status: AC
  Filled 2016-07-03: qty 5

## 2016-07-03 MED ORDER — KETOROLAC TROMETHAMINE 30 MG/ML IJ SOLN
INTRAMUSCULAR | Status: DC | PRN
  Administered 2016-07-03 (×2): 15 mg

## 2016-07-03 MED ORDER — PROPOFOL 500 MG/50ML IV EMUL
INTRAVENOUS | Status: DC | PRN
  Administered 2016-07-03: 75 ug/kg/min via INTRAVENOUS

## 2016-07-03 MED ORDER — ACETAMINOPHEN 10 MG/ML IV SOLN
INTRAVENOUS | Status: AC
Start: 2016-07-03 — End: 2016-07-03
  Filled 2016-07-03: qty 100

## 2016-07-03 MED ORDER — GABAPENTIN 300 MG PO CAPS
300.0000 mg | ORAL_CAPSULE | Freq: Once | ORAL | Status: AC
  Administered 2016-07-03: 300 mg via ORAL
  Filled 2016-07-03: qty 1

## 2016-07-03 MED ORDER — DIPHENHYDRAMINE HCL 25 MG PO CAPS
25.0000 mg | ORAL_CAPSULE | Freq: Four times a day (QID) | ORAL | Status: DC | PRN
  Administered 2016-07-05: 25 mg via ORAL
  Filled 2016-07-03: qty 1

## 2016-07-03 MED ORDER — DEXAMETHASONE SODIUM PHOSPHATE 10 MG/ML IJ SOLN
10.0000 mg | Freq: Once | INTRAMUSCULAR | Status: AC
  Administered 2016-07-03: 10 mg via INTRAVENOUS

## 2016-07-03 MED ORDER — MENTHOL 3 MG MT LOZG: 1.0000 | LOZENGE | OROMUCOSAL | Status: DC | PRN

## 2016-07-03 MED ORDER — TRANEXAMIC ACID 1000 MG/10ML IV SOLN
1000.0000 mg | INTRAVENOUS | Status: AC
  Administered 2016-07-03: 1000 mg via INTRAVENOUS
  Filled 2016-07-03: qty 1100

## 2016-07-03 MED ORDER — SODIUM CHLORIDE 0.9 % IJ SOLN
INTRAMUSCULAR | Status: AC
  Filled 2016-07-03: qty 50

## 2016-07-03 MED ORDER — METOCLOPRAMIDE HCL 5 MG/ML IJ SOLN: 5.0000 mg | Freq: Three times a day (TID) | INTRAMUSCULAR | Status: DC | PRN

## 2016-07-03 MED ORDER — LACTATED RINGERS IV SOLN
INTRAVENOUS | Status: DC
  Administered 2016-07-03 (×3): via INTRAVENOUS

## 2016-07-03 MED ORDER — CHLORHEXIDINE GLUCONATE 4 % EX LIQD: 60.0000 mL | Freq: Once | CUTANEOUS | Status: DC

## 2016-07-03 MED ORDER — SODIUM CHLORIDE 0.9 % IJ SOLN
INTRAMUSCULAR | Status: AC
  Filled 2016-07-03: qty 10

## 2016-07-03 MED ORDER — CELECOXIB 200 MG PO CAPS
200.0000 mg | ORAL_CAPSULE | Freq: Once | ORAL | Status: AC
  Administered 2016-07-03: 200 mg via ORAL
  Filled 2016-07-03: qty 1

## 2016-07-03 MED ORDER — SODIUM CHLORIDE 0.9 % IJ SOLN
INTRAMUSCULAR | Status: DC | PRN
  Administered 2016-07-03 (×2): 14.5 mL

## 2016-07-03 MED ORDER — FENTANYL CITRATE (PF) 100 MCG/2ML IJ SOLN
INTRAMUSCULAR | Status: AC
  Filled 2016-07-03: qty 2

## 2016-07-03 MED ORDER — DEXAMETHASONE SODIUM PHOSPHATE 10 MG/ML IJ SOLN
INTRAMUSCULAR | Status: AC
  Filled 2016-07-03: qty 1

## 2016-07-03 MED ORDER — PHENYLEPHRINE 40 MCG/ML (10ML) SYRINGE FOR IV PUSH (FOR BLOOD PRESSURE SUPPORT)
PREFILLED_SYRINGE | INTRAVENOUS | Status: AC
  Filled 2016-07-03: qty 10

## 2016-07-03 MED ORDER — DOCUSATE SODIUM 100 MG PO CAPS
100.0000 mg | ORAL_CAPSULE | Freq: Two times a day (BID) | ORAL | Status: DC
  Administered 2016-07-03 – 2016-07-07 (×8): 100 mg via ORAL
  Filled 2016-07-03 (×8): qty 1

## 2016-07-03 MED ORDER — BUPIVACAINE HCL (PF) 0.5 % IJ SOLN
INTRAMUSCULAR | Status: DC | PRN
  Administered 2016-07-03: 12 mg via INTRATHECAL

## 2016-07-03 MED ORDER — RIVAROXABAN 10 MG PO TABS
10.0000 mg | ORAL_TABLET | ORAL | Status: DC
  Administered 2016-07-04 – 2016-07-07 (×4): 10 mg via ORAL
  Filled 2016-07-03 (×4): qty 1

## 2016-07-03 MED ORDER — CELECOXIB 200 MG PO CAPS
200.0000 mg | ORAL_CAPSULE | Freq: Two times a day (BID) | ORAL | Status: DC
  Administered 2016-07-03 – 2016-07-07 (×8): 200 mg via ORAL
  Filled 2016-07-03 (×8): qty 1

## 2016-07-03 MED ORDER — ACETAMINOPHEN 500 MG PO TABS
1000.0000 mg | ORAL_TABLET | Freq: Three times a day (TID) | ORAL | Status: DC
  Administered 2016-07-03 – 2016-07-07 (×10): 1000 mg via ORAL
  Filled 2016-07-03 (×11): qty 2

## 2016-07-03 MED ORDER — MIDAZOLAM HCL 2 MG/2ML IJ SOLN
INTRAMUSCULAR | Status: AC
  Filled 2016-07-03: qty 2

## 2016-07-03 SURGICAL SUPPLY — 57 items
BAG ZIPLOCK 12X15 (MISCELLANEOUS) ×3 IMPLANT
BANDAGE ACE 6X5 VEL STRL LF (GAUZE/BANDAGES/DRESSINGS) ×6 IMPLANT
BANDAGE ESMARK 6X9 LF (GAUZE/BANDAGES/DRESSINGS) ×4 IMPLANT
BLADE SAW SGTL 13.0X1.19X90.0M (BLADE) ×6 IMPLANT
BLADE SURG SZ10 CARB STEEL (BLADE) ×3 IMPLANT
BNDG COHESIVE 4X5 TAN STRL (GAUZE/BANDAGES/DRESSINGS) ×6 IMPLANT
BNDG ESMARK 6X9 LF (GAUZE/BANDAGES/DRESSINGS) ×6
BOWL SMART MIX CTS (DISPOSABLE) ×6 IMPLANT
CAPT KNEE TOTAL 3 ATTUNE ×6 IMPLANT
CEMENT HV SMART SET (Cement) ×12 IMPLANT
CLOTH BEACON ORANGE TIMEOUT ST (SAFETY) ×3 IMPLANT
CUFF TOURN SGL QUICK 34 (TOURNIQUET CUFF) ×2
CUFF TRNQT CYL 34X4X40X1 (TOURNIQUET CUFF) ×4 IMPLANT
DECANTER SPIKE VIAL GLASS SM (MISCELLANEOUS) ×3 IMPLANT
DERMABOND ADVANCED (GAUZE/BANDAGES/DRESSINGS) ×2
DERMABOND ADVANCED .7 DNX12 (GAUZE/BANDAGES/DRESSINGS) ×4 IMPLANT
DRAPE EXTREMITY BILATERAL (DRAPES) ×3 IMPLANT
DRAPE INCISE IOBAN 66X45 STRL (DRAPES) ×3 IMPLANT
DRAPE U-SHAPE 47X51 STRL (DRAPES) ×9 IMPLANT
DRESSING AQUACEL AG SP 3.5X10 (GAUZE/BANDAGES/DRESSINGS) ×4 IMPLANT
DRSG AQUACEL AG SP 3.5X10 (GAUZE/BANDAGES/DRESSINGS) ×6
DURAPREP 26ML APPLICATOR (WOUND CARE) ×9 IMPLANT
ELECT REM PT RETURN 9FT ADLT (ELECTROSURGICAL) ×3
ELECTRODE REM PT RTRN 9FT ADLT (ELECTROSURGICAL) ×2 IMPLANT
FACESHIELD WRAPAROUND (MASK) ×9 IMPLANT
GLOVE BIO SURGEON STRL SZ 6.5 (GLOVE) ×3 IMPLANT
GLOVE BIOGEL PI IND STRL 7.0 (GLOVE) ×2 IMPLANT
GLOVE BIOGEL PI IND STRL 7.5 (GLOVE) ×8 IMPLANT
GLOVE BIOGEL PI IND STRL 8 (GLOVE) ×2 IMPLANT
GLOVE BIOGEL PI INDICATOR 7.0 (GLOVE) ×1
GLOVE BIOGEL PI INDICATOR 7.5 (GLOVE) ×4
GLOVE BIOGEL PI INDICATOR 8 (GLOVE) ×1
GLOVE ECLIPSE 8.0 STRL XLNG CF (GLOVE) ×6 IMPLANT
GLOVE ORTHO TXT STRL SZ7.5 (GLOVE) ×9 IMPLANT
GLOVE SURG SS PI 7.0 STRL IVOR (GLOVE) ×3 IMPLANT
GOWN STRL REUS W/ TWL XL LVL3 (GOWN DISPOSABLE) ×4 IMPLANT
GOWN STRL REUS W/TWL LRG LVL3 (GOWN DISPOSABLE) ×6 IMPLANT
GOWN STRL REUS W/TWL XL LVL3 (GOWN DISPOSABLE) ×8 IMPLANT
HANDPIECE INTERPULSE COAX TIP (DISPOSABLE) ×1
MANIFOLD NEPTUNE II (INSTRUMENTS) ×3 IMPLANT
NDL SAFETY ECLIPSE 18X1.5 (NEEDLE) ×4 IMPLANT
NEEDLE HYPO 18GX1.5 SHARP (NEEDLE) ×2
PACK TOTAL KNEE CUSTOM (KITS) ×3 IMPLANT
SET HNDPC FAN SPRY TIP SCT (DISPOSABLE) ×2 IMPLANT
SET PAD KNEE POSITIONER (MISCELLANEOUS) ×6 IMPLANT
SPONGE LAP 18X18 X RAY DECT (DISPOSABLE) ×3 IMPLANT
STOCKINETTE 8 INCH (MISCELLANEOUS) ×6 IMPLANT
SUT MNCRL AB 4-0 PS2 18 (SUTURE) ×6 IMPLANT
SUT VIC AB 1 CT1 36 (SUTURE) ×6 IMPLANT
SUT VIC AB 2-0 CT1 27 (SUTURE) ×4
SUT VIC AB 2-0 CT1 TAPERPNT 27 (SUTURE) ×8 IMPLANT
SUT VLOC 180 0 24IN GS25 (SUTURE) ×6 IMPLANT
SYR 30ML LL (SYRINGE) ×6 IMPLANT
SYRINGE 60CC LL (MISCELLANEOUS) ×3 IMPLANT
TOWEL OR 17X26 10 PK STRL BLUE (TOWEL DISPOSABLE) ×3 IMPLANT
WRAP KNEE MAXI GEL POST OP (GAUZE/BANDAGES/DRESSINGS) ×6 IMPLANT
YANKAUER SUCT BULB TIP 10FT TU (MISCELLANEOUS) ×3 IMPLANT

## 2016-07-03 NOTE — Anesthesia Postprocedure Evaluation (Signed)
Anesthesia Post Note  Patient: [name]  Procedure(s) Performed: Procedure(s) (LRB): TOTAL KNEE BILATERAL ARTHROPLASTY (Bilateral) LEFT KNEE HARDWARE REMOVAL (Left)  Patient location during evaluation: PACU Anesthesia Type: Spinal and MAC Level of consciousness: awake and alert Pain management: pain level controlled Vital Signs Assessment: post-procedure vital signs reviewed and stable Respiratory status: spontaneous breathing and respiratory function stable Cardiovascular status: blood pressure returned to baseline and stable Postop Assessment: spinal receding Anesthetic complications: no    Last Vitals:  Vitals:   07/03/16 1415 07/03/16 1430  BP: 125/77 128/75  Pulse: (!) 56 (!) 59  Resp: 10 11  Temp:      Last Pain:  Vitals:   07/03/16 0716  TempSrc:   PainSc: 3                  [name],W. EDMOND

## 2016-07-03 NOTE — Op Note (Signed)
NAME:  [name]                      MEDICAL RECORD NO.:  [id number]                             FACILITY:  [facility]      PHYSICIAN:  Pietro Cassis. [name], M.D.  DATE OF BIRTH:  1951/03/18      DATE OF PROCEDURE:  07/03/2016                                     OPERATIVE REPORT         PREOPERATIVE DIAGNOSIS: 1. Bilateral knee osteoarthritis. 2.  Left knee retained Orthopaedic hardware (prior ACL surgeries)     POSTOPERATIVE DIAGNOSIS: 1. Bilateral knee osteoarthritis. 2.  Left knee retained Orthopaedic hardware (prior ACL surgeries)      FINDINGS:  The patient was noted to have complete loss of cartilage and   bone-on-bone arthritis with associated osteophytes in all three compartments of   the knee with a significant synovitis and associated effusion.  The left knee retained hardware became visible during the preparation of femoral tibial bone.     PROCEDURE:  Left total knee replacement.  (right knee done second, see below for details)     COMPONENTS USED:  DePuy Attune rotating platform posterior stabilized knee   system, a size 6 femur, size 5 tibia, size 8 PS AOX insert, and 38 anatomic patellar   button.      SURGEON:  Pietro Cassis. [name], M.D.      ASSISTANT:  Molli Barrows, PA-C.      ANESTHESIA:  Spinal.      SPECIMENS:  None.      COMPLICATION:  None.      DRAINS:  None.  EBL: <100cc      TOURNIQUET TIME:   Total Tourniquet Time Documented: Thigh (Left) - 40 minutes Total: Thigh (Left) - 40 minutes  Thigh (Right) - 38 minutes Total: Thigh (Right) - 38 minutes  .      The patient was stable to the recovery room.      INDICATION FOR PROCEDURE:  [name] is a 65 y.o. female patient of   mine.  The patient had been seen, evaluated, and treated conservatively in the   office with medication, activity modification, and injections.  The patient had   radiographic changes of bone-on-bone arthritis with endplate sclerosis and osteophytes noted.      The  patient failed conservative measures including medication, injections, and activity modification, and at this point was ready for more definitive measures.   Based on the radiographic changes and failed conservative measures, the patient   decided to proceed with total knee replacement.  Risks of infection,   DVT, component failure, need for revision surgery, postop course, and   expectations were all   discussed and reviewed.  Consent was obtained for benefit of pain   relief.      PROCEDURE IN DETAIL:  The patient was brought to the operative theater.   Once adequate anesthesia, preoperative antibiotics, 2 gm of Ancef, 1 gm of Tranexamic Acid, and 10 mg of Decadron administered, the patient was positioned supine with bilateral thigh tourniquets placed.  Attention was first directed to the left knee. Both lower extremities were prepped and draped  in sterile fashion.  A time-   out was performed identifying the patient, planned procedure, and   extremity.      The both lower extremities were placed in [facility] leg holders.  First the left leg was   exsanguinated, tourniquet elevated to 250 mmHg.  A para midline incision was   made (based on previous surgical history) followed by median parapatellar arthrotomy.  Following initial   exposure, attention was first directed to the patella.  Precut   measurement was noted to be 21 mm.  I resected down to 13-14 mm and used a   38 anatomic patellar button to restore patellar height as well as cover the cut   surface.      The lug holes were drilled and a metal shim was placed to protect the   patella from retractors and saw blades.      At this point, attention was now directed to the femur.  The femoral   canal was opened with a drill, irrigated to try to prevent fat emboli.  An   intramedullary rod was passed at 3 degrees valgus, 9 mm of bone was   resected off the distal femur.  Following this resection, the tibia was   subluxated anteriorly.   Using the extramedullary guide, 2 mm of bone was resected off   the proximal medial tibia.  We confirmed the gap would be   stable medially and laterally with a size 6 spacer gap as well as confirmed   the cut was perpendicular in the coronal plane, checking with an alignment rod.      Once this was done, I sized the femur to be a size 6 in the anterior-   posterior dimension, chose a standard component based on medial and   lateral dimension.  The size 6 rotation block was then pinned in   position anterior referenced using the C-clamp to set rotation.  The   anterior, posterior, and  chamfer cuts were made without difficulty nor   notching making certain that I was along the anterior cortex to help   with flexion gap stability.       The final box cut was made off the lateral aspect of distal femur.During the preparation of the box the previously placed femoral ACL screw was encountered and removed without issue.      At this point, the tibia was sized to be a size 5, the size 5 tray was   then pinned in position through the medial third of the tubercle,   drilled, and keel punched.  While preparing the tibia for the keeled portion I identified the tibia ACL screw.  Due to its contact with the implant it needed to be removed. After freeing it up with an osteotome it was removed without issue.  Trial reduction was now carried with a 6 femur,  5 tibia, up to the 8 insert, and the 38 patella botton.  The knee was brought to   extension, full extension with good flexion stability with the patella   tracking through the trochlea without application of pressure.  Given   all these findings, the trial components removed.  Final components were   opened and cement was mixed.  The knee was irrigated with normal saline   solution and pulse lavage.  The synovial lining was   then injected with one half of a combination of 30 cc 0.25% Marcaine with epinephrine and 1 cc of Toradol plus 30 cc  of NS.       The knee was irrigated.  Final implants were then cemented onto clean and   dried cut surfaces of bone with the knee brought to extension with a size 8 mm trial insert.      Once the cement had fully cured, the excess cement was removed   throughout the knee.  I confirmed I was satisfied with the range of   motion and stability, and the final size 8 PS AOX insert was chosen.  It was   placed into the knee.      The tourniquet had been let down at 40 minutes.  No significant   hemostasis required.  The   extensor mechanism was then reapproximated using #1 Vicryl and #0 V-lock sutures with the knee   in flexion.  The   remaining wound was closed with 2-0 Vicryl and running 4-0 Monocryl.   The knee was cleaned, dried, dressed sterilely using Dermabond and   Aquacel dressing.  The patient was then   brought to recovery room in stable condition, tolerating the procedure   well.   Once the left knee extensor mechanism was closed I began to work on the right knee, as below                        MEDICAL RECORD NO.:  [id number]                             FACILITY:  [facility]      PHYSICIAN:  Pietro Cassis. [name], M.D.  DATE OF BIRTH:  1950-09-29      DATE OF PROCEDURE:  07/03/2016                                     OPERATIVE REPORT         PREOPERATIVE DIAGNOSIS:  Bilateral knee osteoarthritis.      POSTOPERATIVE DIAGNOSIS:  Bilateral knee osteoarthritis.      FINDINGS:  The patient was noted to have complete loss of cartilage and   bone-on-bone arthritis with associated osteophytes in all threecompartments of   the knee with a significant synovitis and associated effusion.      PROCEDURE:  Right total knee replacement.      COMPONENTS USED:  DePuy Attune rotating platform posterior stabilized knee   system, a size 6 femur, 5 tibia, size 6 PS AOX insert, and 38 anatomic patellar   button.      SURGEON:  Pietro Cassis. [name], M.D.      ASSISTANT:  Molli Barrows, PA-C.      ANESTHESIA:   Spinal.      SPECIMENS:  None.      COMPLICATION:  None.      DRAINS:  None.  EBL: <100cc      TOURNIQUET TIME:   Total Tourniquet Time Documented: Thigh (Left) - 40 minutes Total: Thigh (Left) - 40 minutes  Thigh (Right) - 38 minutes Total: Thigh (Right) - 38 minutes  .      The patient was stable to the recovery room.      INDICATION FOR PROCEDURE:  [name] is a 65 y.o. female patient of   mine.  The patient had been seen, evaluated, and treated conservatively in the   office with medication, activity modification, and injections.  The  patient had   radiographic changes of bone-on-bone arthritis with endplate sclerosis and osteophytes noted.      The patient failed conservative measures including medication, injections, and activity modification, and at this point was ready for more definitive measures.   Based on the radiographic changes and failed conservative measures, the patient   decided to proceed with total knee replacement.  Risks of infection,   DVT, component failure, need for revision surgery, postop course, and   expectations were all   discussed and reviewed.  Consent was obtained for benefit of pain   relief.      PROCEDURE IN DETAIL:  The patient was brought to the operative theater.   Once adequate anesthesia, preoperative antibiotics, 2 gm of Ancef administered, the patient was positioned supine with bilateral thigh tourniquets placed.  Both lower extremities were prepped and draped in sterile fashion.  A time-   out was performed identifying the patient, planned procedure, and   extremity.      The both lower extremities were placed in the [facility] leg holdesr.  At this point the right leg was   exsanguinated, tourniquet elevated to 250 mmHg.  A midline incision was   made followed by median parapatellar arthrotomy.  Following initial   exposure, attention was first directed to the patella.  Precut   measurement was noted to be 21-22 mm.  I  resected down to 13-14 mm and used a   38 patellar button to restore patellar height as well as cover the cut   surface.      The lug holes were drilled and a metal shim was placed to protect the   patella from retractors and saw blades.      At this point, attention was now directed to the femur.  The femoral   canal was opened with a drill, irrigated to try to prevent fat emboli.  An   intramedullary rod was passed at 3 degrees valgus, 9 mm of bone was   resected off the distal femur.  Following this resection, the tibia was   subluxated anteriorly.  Using the extramedullary guide, 2 mm of bone was resected off   the proximal medial tibia.  We confirmed the gap would be   stable medially and laterally with a size 5 spacer block as well as confirmed   the cut was perpendicular in the coronal plane, checking with an alignment rod.      Once this was done, I sized the femur to be a size 6 in the anterior-   posterior dimension, chose a standard component based on medial and   lateral dimension.  The size 6 rotation block was then pinned in   position anterior referenced using the C-clamp to set rotation.  The   anterior, posterior, and  chamfer cuts were made without difficulty nor   notching making certain that I was along the anterior cortex to help   with flexion gap stability.      The final box cut was made off the lateral aspect of distal femur.      At this point, the tibia was sized to be a size 5, the size 5 tray was   then pinned in position through the medial third of the tubercle,   drilled, and keel punched.  Trial reduction was now carried with a 6 femur,  5 tibia, a size 5 then 6 PS insert, and the 38 anatomic patella botton.  The knee was brought to  extension, full extension with good flexion stability with the patella   tracking through the trochlea without application of pressure.  Given   all these findings the femoral lug holes were drilled and then the trial  components removed.  Final components were   opened and cement was mixed.  The knee was irrigated with normal saline   solution and pulse lavage.  The synovial lining was   then injected with the other one half of the combination of 30 cc of 0.25% Marcaine with epinephrine and 1 cc of Toradol plus 30 cc of NS.      The knee was irrigated.  Final implants were then cemented onto clean and   dried cut surfaces of bone with the knee brought to extension with a size 6 trial insert.      Once the cement had fully cured, the excess cement was removed   throughout the knee.  I confirmed I was satisfied with the range of   motion and stability, and the final size 6 PS AOX insert was chosen.  It was   placed into the knee.      The tourniquet had been let down at 38 minutes.  No significant   hemostasis required.  The extensor mechanism was then reapproximated using #1 Vicryl and #0 V-lock sutures with the knee in flexion.  The remaining wound was closed with 2-0 Vicryl and running 4-0 Monocryl.   The knee was cleaned, dried, dressed sterilely using Dermabond and   Aquacel dressing.  The patient was then   brought to recovery room in stable condition, tolerating the procedure   well.   Please note that Physician Assistant, Molli Barrows, PA-C, was present for the entirety of the case, and was utilized for pre-operative positioning, peri-operative retractor management, general facilitation of the procedure.  He was also utilized for primary wound closure at the end of the case.              Pietro Cassis [name], M.D.    07/03/2016 1:57 PM

## 2016-07-03 NOTE — Transfer of Care (Signed)
Immediate Anesthesia Transfer of Care Note  Patient: [name]  Procedure(s) Performed: Procedure(s): TOTAL KNEE BILATERAL ARTHROPLASTY (Bilateral) LEFT KNEE HARDWARE REMOVAL (Left)  Patient Location: PACU  Anesthesia Type:MAC and Spinal  Level of Consciousness: awake, alert , oriented and patient cooperative  Airway & Oxygen Therapy: Patient Spontanous Breathing and Patient connected to face mask oxygen  Post-op Assessment: Report given to RN and Post -op Vital signs reviewed and stable  Post vital signs: Reviewed and stable  Last Vitals:  Vitals:   07/03/16 0644  BP: (!) 146/86  Pulse: 73  Resp: 16  Temp: 36.6 C    Last Pain:  Vitals:   07/03/16 0716  TempSrc:   PainSc: 3          Complications: No apparent anesthesia complications

## 2016-07-03 NOTE — Anesthesia Procedure Notes (Signed)
Procedure Name: MAC Date/Time: 07/03/2016 10:41 AM Performed by: Dione Booze Pre-anesthesia Checklist: Patient identified, Emergency Drugs available, Suction available and Patient being monitored Oxygen Delivery Method: Simple face mask Placement Confirmation: positive ETCO2

## 2016-07-03 NOTE — Anesthesia Procedure Notes (Signed)
Spinal  Patient location during procedure: OR Start time: 07/03/2016 10:35 AM End time: 07/03/2016 10:40 AM Staffing Anesthesiologist: Roderic Palau Performed: anesthesiologist  Preanesthetic Checklist Completed: patient identified, surgical consent, pre-op evaluation, timeout performed, IV checked, risks and benefits discussed and monitors and equipment checked Spinal Block Patient position: sitting Prep: Betadine Patient monitoring: cardiac monitor, continuous pulse ox and blood pressure Approach: midline Location: L3-4 Injection technique: single-shot Needle Needle type: Pencan  Needle gauge: 24 G Needle length: 9 cm Assessment Sensory level: T8 Additional Notes Functioning IV was confirmed and monitors were applied. Sterile prep and drape, including hand hygiene and sterile gloves were used. The patient was positioned and the spine was prepped. The skin was anesthetized with lidocaine.  Free flow of clear CSF was obtained prior to injecting local anesthetic into the CSF.  The spinal needle aspirated freely following injection.  The needle was carefully withdrawn.  The patient tolerated the procedure well.

## 2016-07-03 NOTE — Discharge Instructions (Addendum)
INSTRUCTIONS AFTER JOINT REPLACEMENT  ° °o Remove items at home which could result in a fall. This includes throw rugs or furniture in walking pathways °o ICE to the affected joint every three hours while awake for 30 minutes at a time, for at least the first 3-5 days, and then as needed for pain and swelling.  Continue to use ice for pain and swelling. You may notice swelling that will progress down to the foot and ankle.  This is normal after surgery.  Elevate your leg when you are not up walking on it.   °o Continue to use the breathing machine you got in the hospital (incentive spirometer) which will help keep your temperature down.  It is common for your temperature to cycle up and down following surgery, especially at night when you are not up moving around and exerting yourself.  The breathing machine keeps your lungs expanded and your temperature down. ° ° °DIET:  As you were doing prior to hospitalization, we recommend a well-balanced diet. ° °DRESSING / WOUND CARE / SHOWERING ° °Keep the surgical dressing until follow up.  The dressing is water proof, so you can shower without any extra covering.  IF THE DRESSING FALLS OFF or the wound gets wet inside, change the dressing with sterile gauze.  Please use good hand washing techniques before changing the dressing.  Do not use any lotions or creams on the incision until instructed by your surgeon.   ° °ACTIVITY ° °o Increase activity slowly as tolerated, but follow the weight bearing instructions below.   °o No driving for 6 weeks or until further direction given by your physician.  You cannot drive while taking narcotics.  °o No lifting or carrying greater than 10 lbs. until further directed by your surgeon. °o Avoid periods of inactivity such as sitting longer than an hour when not asleep. This helps prevent blood clots.  °o You may return to work once you are authorized by your doctor.  ° ° ° °WEIGHT BEARING  ° °Weight bearing as tolerated with assist  device (walker, cane, etc) as directed, use it as long as suggested by your surgeon or therapist, typically at least 4-6 weeks. ° ° °EXERCISES ° °Results after joint replacement surgery are often greatly improved when you follow the exercise, range of motion and muscle strengthening exercises prescribed by your doctor. Safety measures are also important to protect the joint from further injury. Any time any of these exercises cause you to have increased pain or swelling, decrease what you are doing until you are comfortable again and then slowly increase them. If you have problems or questions, call your caregiver or physical therapist for advice.  ° °Rehabilitation is important following a joint replacement. After just a few days of immobilization, the muscles of the leg can become weakened and shrink (atrophy).  These exercises are designed to build up the tone and strength of the thigh and leg muscles and to improve motion. Often times heat used for twenty to thirty minutes before working out will loosen up your tissues and help with improving the range of motion but do not use heat for the first two weeks following surgery (sometimes heat can increase post-operative swelling).  ° °These exercises can be done on a training (exercise) mat, on the floor, on a table or on a bed. Use whatever works the best and is most comfortable for you.    Use music or television while you are exercising so that   the exercises are a pleasant break in your day. This will make your life better with the exercises acting as a break in your routine that you can look forward to.   Perform all exercises about fifteen times, three times per day or as directed.  You should exercise both the operative leg and the other leg as well. ° °Exercises include: °  °• Quad Sets - Tighten up the muscle on the front of the thigh (Quad) and hold for 5-10 seconds.   °• Straight Leg Raises - With your knee straight (if you were given a brace, keep it on),  lift the leg to 60 degrees, hold for 3 seconds, and slowly lower the leg.  Perform this exercise against resistance later as your leg gets stronger.  °• Leg Slides: Lying on your back, slowly slide your foot toward your buttocks, bending your knee up off the floor (only go as far as is comfortable). Then slowly slide your foot back down until your leg is flat on the floor again.  °• Angel Wings: Lying on your back spread your legs to the side as far apart as you can without causing discomfort.  °• Hamstring Strength:  Lying on your back, push your heel against the floor with your leg straight by tightening up the muscles of your buttocks.  Repeat, but this time bend your knee to a comfortable angle, and push your heel against the floor.  You may put a pillow under the heel to make it more comfortable if necessary.  ° °A rehabilitation program following joint replacement surgery can speed recovery and prevent re-injury in the future due to weakened muscles. Contact your doctor or a physical therapist for more information on knee rehabilitation.  ° ° °CONSTIPATION ° °Constipation is defined medically as fewer than three stools per week and severe constipation as less than one stool per week.  Even if you have a regular bowel pattern at home, your normal regimen is likely to be disrupted due to multiple reasons following surgery.  Combination of anesthesia, postoperative narcotics, change in appetite and fluid intake all can affect your bowels.  ° °YOU MUST use at least one of the following options; they are listed in order of increasing strength to get the job done.  They are all available over the counter, and you may need to use some, POSSIBLY even all of these options:   ° °Drink plenty of fluids (prune juice may be helpful) and high fiber foods °Colace 100 mg by mouth twice a day  °Senokot for constipation as directed and as needed Dulcolax (bisacodyl), take with full glass of water  °Miralax (polyethylene glycol)  once or twice a day as needed. ° °If you have tried all these things and are unable to have a bowel movement in the first 3-4 days after surgery call either your surgeon or your primary doctor.   ° °If you experience loose stools or diarrhea, hold the medications until you stool forms back up.  If your symptoms do not get better within 1 week or if they get worse, check with your doctor.  If you experience "the worst abdominal pain ever" or develop nausea or vomiting, please contact the office immediately for further recommendations for treatment. ° ° °ITCHING:  If you experience itching with your medications, try taking only a single pain pill, or even half a pain pill at a time.  You can also use Benadryl over the counter for itching or also to   help with sleep.   TED HOSE STOCKINGS:  Use stockings on both legs until for at least 2 weeks or as directed by physician office. They may be removed at night for sleeping.  MEDICATIONS:  See your medication summary on the After Visit Summary that nursing will review with you.  You may have some home medications which will be placed on hold until you complete the course of blood thinner medication.  It is important for you to complete the blood thinner medication as prescribed.  PRECAUTIONS:  If you experience chest pain or shortness of breath - call 911 immediately for transfer to the hospital emergency department.   If you develop a fever greater that 101 F, purulent drainage from wound, increased redness or drainage from wound, foul odor from the wound/dressing, or calf pain - CONTACT YOUR SURGEON.                                                   FOLLOW-UP APPOINTMENTS:  If you do not already have a post-op appointment, please call the office for an appointment to be seen by your surgeon.  Guidelines for how soon to be seen are listed in your After Visit Summary, but are typically between 1-4 weeks after surgery.  OTHER INSTRUCTIONS:   Knee  Replacement:  Do not place pillow under knee, focus on keeping the knee straight while resting.   MAKE SURE YOU:   Understand these instructions.   Get help right away if you are not doing well or get worse.    Thank you for letting us be a part of your medical care team.  It is a privilege we respect greatly.  We hope these instructions will help you stay on track for a fast and full recovery!    ========================================================================================================================== Information on my medicine - XARELTO (Rivaroxaban)  This medication education was reviewed with me or my healthcare representative as part of my discharge preparation.  The pharmacist that spoke with me during my hospital stay was:  Youcef Klas, Julieta Bellini, RPH  Why was Xarelto prescribed for you? Xarelto was prescribed for you to reduce the risk of blood clots forming after orthopedic surgery. The medical term for these abnormal blood clots is venous thromboembolism (VTE).  What do you need to know about xarelto ? Take your Xarelto ONCE DAILY at the same time every day. You may take it either with or without food.  If you have difficulty swallowing the tablet whole, you may crush it and mix in applesauce just prior to taking your dose.  Take Xarelto exactly as prescribed by your doctor and DO NOT stop taking Xarelto without talking to the doctor who prescribed the medication.  Stopping without other VTE prevention medication to take the place of Xarelto may increase your risk of developing a clot.  After discharge, you should have regular check-up appointments with your healthcare provider that is prescribing your Xarelto.    What do you do if you miss a dose? If you miss a dose, take it as soon as you remember on the same day then continue your regularly scheduled once daily regimen the next day. Do not take two doses of Xarelto on the same day.   Important Safety  Information A possible side effect of Xarelto is bleeding. You should call your healthcare provider right away  if you experience any of the following: ? Bleeding from an injury or your nose that does not stop. ? Unusual colored urine (red or dark brown) or unusual colored stools (red or black). ? Unusual bruising for unknown reasons. ? A serious fall or if you hit your head (even if there is no bleeding).  Some medicines may interact with Xarelto and might increase your risk of bleeding while on Xarelto. To help avoid this, consult your healthcare provider or pharmacist prior to using any new prescription or non-prescription medications, including herbals, vitamins, non-steroidal anti-inflammatory drugs (NSAIDs) and supplements.  This website has more information on Xarelto: [email or web address]

## 2016-07-04 LAB — BASIC METABOLIC PANEL
ANION GAP: 6 (ref 5–15)
BUN: 14 mg/dL (ref 6–20)
CHLORIDE: 105 mmol/L (ref 101–111)
CO2: 26 mmol/L (ref 22–32)
Calcium: 8.5 mg/dL — ABNORMAL LOW (ref 8.9–10.3)
Creatinine, Ser: 0.73 mg/dL (ref 0.44–1.00)
GFR calc Af Amer: >60 mL/min (ref >60)
GLUCOSE: 155 mg/dL — AB (ref 65–99)
Potassium: 4.3 mmol/L (ref 3.5–5.1)
Sodium: 137 mmol/L (ref 135–145)

## 2016-07-04 LAB — CBC
HEMATOCRIT: 31.7 % — AB (ref 36.0–46.0)
HEMOGLOBIN: 10.6 g/dL — AB (ref 12.0–15.0)
MCH: 30.4 pg (ref 26.0–34.0)
MCHC: 33.4 g/dL (ref 30.0–36.0)
MCV: 90.8 fL (ref 78.0–100.0)
PLATELETS: 182 10*3/uL (ref 150–400)
RBC: 3.49 MIL/uL — AB (ref 3.87–5.11)
RDW: 12.6 % (ref 11.5–15.5)
WBC: 9 10*3/uL (ref 4.0–10.5)

## 2016-07-04 MED ORDER — PROMETHAZINE HCL 25 MG/ML IJ SOLN
6.2500 mg | Freq: Four times a day (QID) | INTRAMUSCULAR | Status: DC | PRN
  Administered 2016-07-04: 12.5 mg via INTRAVENOUS
  Filled 2016-07-04: qty 1

## 2016-07-04 NOTE — Care Management Note (Signed)
Case Management Note  Patient Details  Name: [name] MRN: [id number] Date of Birth: 03-08-1951  Subjective/Objective:                  TOTAL KNEE BILATERAL ARTHROPLASTY (Bilateral) LEFT KNEE HARDWARE REMOVAL (Left)  Action/Plan: Discharge planning Expected Discharge Date:  07/05/16               Expected Discharge Plan:  [place]  In-House Referral:     Discharge planning Services  CM Consult  Post Acute Care Choice:  Home Health Choice offered to:  Patient  DME Arranged:  N/A DME Agency:  NA  HH Arranged:  PT [place] Agency:  [facility] ([facility]  Status of Service:  Completed, signed off  If discussed at [employer], dates discussed:    Additional Comments: CM met with pt in room to offer choice of home health agency.  Pt chooses [facility] to render HHPT.  Referral called to [facility] rep, Stanton Kidney.  Pt states she has all DME at home.  No other CM needs were communicated. Dellie Catholic, RN 07/04/2016, 10:53 AM

## 2016-07-04 NOTE — Progress Notes (Signed)
Orthopedic Tech Progress Note Patient Details:  [name] [birth year] [id number]  Ortho Devices Type of Ortho Device: Knee Immobilizer Ortho Device/Splint Location: Dropped off Knee Immobilizer with physical therapist, PT said she would apply. Ortho Device/Splint Interventions: Berenice Primas 07/04/2016, 12:06 PM

## 2016-07-04 NOTE — Evaluation (Signed)
Physical Therapy Evaluation Patient Details Name: [name] MRN: [id number] DOB: 07/25/1951 Today's Date: 07/04/2016   History of Present Illness  s/p bil TKA  Clinical Impression  The  Patient  Experienced increased Nausea and dizziness upon standing. The patient does not have 24/7 caregivers at [place]. The patient will benefit from CIR at DC to return to functional independence to return home./    Follow Up Recommendations CIR;Supervision/Assistance - 24 hour    Equipment Recommendations  None recommended by PT    Recommendations for Other Services       Precautions / Restrictions Precautions Precautions: Knee;Fall Precaution Comments: used L KI, monitor BP, dizzy Restrictions Weight Bearing Restrictions: No      Mobility  Bed Mobility Overal bed mobility: Needs Assistance Bed Mobility: Sit to Supine       Sit to supine: Min assist   General bed mobility comments: assist for LLE  Transfers Overall transfer level: Needs assistance Equipment used: Rolling walker (2 wheeled) Transfers: Sit to/from [facility] Sit to Stand: Min assist;+2 safety/equipment Stand pivot transfers: Min assist;+2 safety/equipment       General transfer comment: assist to rise and steady. Cues for UE/LE placement  Ambulation/Gait Ambulation/Gait assistance: Mod assist;+2 physical assistance;+2 safety/equipment Ambulation Distance (Feet): 5 Feet Assistive device: Rolling walker (2 wheeled) Gait Pattern/deviations: Step-to pattern     General Gait Details: cues for sequence, c/o dizziness, feeling flushed and nauseated. Recliner brought up. BP 125/69.  Stairs            Wheelchair Mobility    Modified Rankin (Stroke Patients Only)       Balance Overall balance assessment: Needs assistance         Standing balance support: During functional activity;Bilateral upper extremity supported Standing balance-Leahy Scale: Poor                                Pertinent Vitals/Pain Pain Assessment: 0-10 Pain Score:  (5-6) Pain Location: 5 left, 6 right Pain Descriptors / Indicators: Aching Pain Intervention(s): Limited activity within patient's tolerance;Monitored during session;Premedicated before session;Repositioned;Ice applied    Home Living Family/patient expects to be discharged to:: Private residence Living Arrangements: Spouse/significant other             Home Equipment: Bedside commode;Walker - 2 wheels (AE)      Prior Function Level of Independence: Independent         Comments: husband works during the day     Journalist, newspaper        Extremity/Trunk Assessment   Upper Extremity Assessment: Defer to OT evaluation           Lower Extremity Assessment: RLE deficits/detail;LLE deficits/detail RLE Deficits / Details: SLR with a lag, knee flexion 10-to 50 LLE Deficits / Details:  SLR with no lag, 5050 flexion  Cervical / Trunk Assessment: Normal  Communication   Communication: No difficulties  Cognition Arousal/Alertness: Awake/alert Behavior During Therapy: WFL for tasks assessed/performed Overall Cognitive Status: Within Functional Limits for tasks assessed                      General Comments      Exercises Total Joint Exercises Ankle Circles/Pumps: AROM;Both;10 reps;Supine Quad Sets: AROM;Both;10 reps;Supine Short Arc Quad: AAROM;Both;10 reps;Supine Heel Slides: AAROM;Both;10 reps;Supine Hip ABduction/ADduction: AAROM;Both;10 reps;Supine Straight Leg Raises: AAROM;Both;10 reps;Supine   Assessment/Plan    PT Assessment Patient needs continued PT services  PT Problem List Decreased strength;Decreased range of motion;Decreased activity tolerance;Decreased balance;Decreased mobility;Cardiopulmonary status limiting activity;Decreased knowledge of precautions;Decreased safety awareness;Decreased knowledge of use of DME          PT Treatment Interventions DME  instruction;Gait training;Stair training;Functional mobility training;Therapeutic activities;Therapeutic exercise;Patient/family education    PT Goals (Current goals can be found in the Care Plan section)  Acute Rehab PT Goals Patient Stated Goal: return to independence and active lifestyle PT Goal Formulation: With patient Time For Goal Achievement: 07/18/16 Potential to Achieve Goals: Good    Frequency 7X/week   Barriers to discharge Decreased caregiver support;Inaccessible home environment spouse will not be home 24/7    Co-evaluation PT/OT/SLP Co-Evaluation/Treatment: Yes Reason for Co-Treatment: For patient/therapist safety PT goals addressed during session: Mobility/safety with mobility OT goals addressed during session: ADL's and self-care       End of Session Equipment Utilized During Treatment: Gait belt Activity Tolerance: Treatment limited secondary to medical complications (Comment) (dizziness) Patient left: in chair;with call bell/phone within reach;with chair alarm set Nurse Communication: Mobility status         Time: 1137-1227 PT Time Calculation (min) (ACUTE ONLY): 50 min   Charges:   PT Evaluation $PT Eval Moderate Complexity: 1 Procedure PT Treatments $Therapeutic Exercise: 8-22 mins   PT G Codes:        Claretha Cooper 07/04/2016, 1:40 PM Tresa Endo PT [phone]

## 2016-07-04 NOTE — Evaluation (Signed)
Occupational Therapy Evaluation Patient Details Name: [name] MRN: [id number] DOB: 04-Feb-1951 Today's Date: 07/04/2016    History of Present Illness s/p bil TKA   Clinical Impression   This 65 year old female was admitted for the above sx.  She will benefit from continued OT to increase safety and independence with adls.  Pt was lightheaded and nauseous during OT evaluation; min A +2 for SPT and standing. Goals in acute are for min guard A    Follow Up Recommendations  CIR    Equipment Recommendations  None recommended by OT    Recommendations for Other Services       Precautions / Restrictions Precautions Precautions: Knee;Fall Precaution Comments: used L KI Restrictions Weight Bearing Restrictions: No      Mobility Bed Mobility Overal bed mobility: Needs Assistance Bed Mobility: Sit to Supine       Sit to supine: Min assist   General bed mobility comments: assist for LLE  Transfers Overall transfer level: Needs assistance Equipment used: Rolling walker (2 wheeled) Transfers: Sit to/from [facility] Sit to Stand: Min assist;+2 safety/equipment Stand pivot transfers: Min assist;+2 safety/equipment       General transfer comment: assist to rise and steady. Cues for UE/LE placement    Balance                                            ADL Overall ADL's : Needs assistance/impaired     Grooming: Set up;Sitting   Upper Body Bathing: Set up;Sitting   Lower Body Bathing: Moderate assistance;+2 for safety/equipment;Sit to/from stand   Upper Body Dressing : Set up;Sitting   Lower Body Dressing: Maximal assistance;+2 for safety/equipment;Sit to/from stand   Toilet Transfer: Minimal assistance;+2 for safety/equipment;Stand-pivot;BSC;RW (couple of steps to chair)   Toileting- Clothing Manipulation and Hygiene: Minimal assistance;+2 for safety/equipment;Sit to/from stand         General ADL Comments: Pt a  little lightheaded when she sat up:  got worse with a couple of steps (and also nauseous)--got BP after sitting back down.  supine:  115/68; sit 111/59; sitting after a few steps 125/69.  Pt has borrowed AE, educated on this, but did not use it this session      Vision     Perception     Praxis      Pertinent Vitals/Pain Pain Assessment: 0-10 Pain Score:  (5-6) Pain Location: LLE and RLE Pain Descriptors / Indicators: Aching Pain Intervention(s): Limited activity within patient's tolerance;Monitored during session;Premedicated before session;Repositioned;Ice applied     Hand Dominance     Extremity/Trunk Assessment Upper Extremity Assessment Upper Extremity Assessment: Overall WFL for tasks assessed (h/o R shoulder/biceps sx)           Communication Communication Communication: No difficulties   Cognition Arousal/Alertness: Awake/alert Behavior During Therapy: WFL for tasks assessed/performed Overall Cognitive Status: Within Functional Limits for tasks assessed                     General Comments       Exercises       Shoulder Instructions      Home Living Family/patient expects to be discharged to:: Private residence Living Arrangements: Spouse/significant other                 Bathroom Shower/Tub: Occupational psychologist: Standard  Home Equipment: Bedside commode;Walker - 2 wheels (AE)          Prior Functioning/Environment Level of Independence: Independent        Comments: husband works during the day        OT Problem List: Decreased strength;Decreased activity tolerance;Decreased knowledge of use of DME or AE;Pain;Cardiopulmonary status limiting activity   OT Treatment/Interventions: Self-care/ADL training;DME and/or AE instruction;Patient/family education    OT Goals(Current goals can be found in the care plan section) Acute Rehab OT Goals Patient Stated Goal: return to independence and active lifestyle OT  Goal Formulation: With patient Time For Goal Achievement: 07/11/16 Potential to Achieve Goals: Good ADL Goals Pt Will Perform Lower Body Bathing: with min guard assist;sit to/from stand;with adaptive equipment Pt Will Perform Lower Body Dressing: with min guard assist;sit to/from stand;with adaptive equipment Pt Will Transfer to Toilet: with min guard assist;ambulating;bedside commode Pt Will Perform Toileting - Clothing Manipulation and hygiene: with min guard assist;sit to/from stand Pt Will Perform Tub/Shower Transfer: Shower transfer;with min guard assist;ambulating;3 in 1  OT Frequency: Min 2X/week   Barriers to D/C:            Co-evaluation PT/OT/SLP Co-Evaluation/Treatment: Yes Reason for Co-Treatment: For patient/therapist safety PT goals addressed during session: Mobility/safety with mobility OT goals addressed during session: ADL's and self-care      End of Session Nurse Communication:  (lightheadedness)  Activity Tolerance: Patient tolerated treatment well Patient left: in chair;with call bell/phone within reach;with chair alarm set   Time: 1203-1225 OT Time Calculation (min): 22 min Charges:   1 Eval low complexity G-Codes:    [name] July 12, 2016, 12:55 PM [name], OTR/L [phone] 12-Jul-2016

## 2016-07-04 NOTE — Progress Notes (Signed)
Inpatient Rehabilitation  PT and OT are recommending IP Rehab.  I will attempt to speak with pt. by phone this afternoon and an admissions coordinator will follow up tomorrow for a possible IP Rehab admission pending medical readiness, insurance approval , necessity for IP Rehab and bed availability.    I note pt. is POD #1 today.  Please call if questions.  [facility] Admissions Coordinator Cell [phone] Office [phone]

## 2016-07-04 NOTE — Progress Notes (Addendum)
     Subjective: 1 Day Post-Op Procedure(s) (LRB): TOTAL KNEE BILATERAL ARTHROPLASTY (Bilateral) LEFT KNEE HARDWARE REMOVAL (Left)   Seen with Dr. [name]. Patient reports pain as moderate, pain controlled.  No events throughout the night.  Some nausea started this morning when she first leaned up in the bed.  Ready to start working with therapy and starting on the road to recovery.    Objective:   VITALS:   Vitals:   07/04/16 0221 07/04/16 0648  BP: 106/87 (!) 90/50  Pulse: 68 71  Resp: 16 16  Temp: 97.9 F (36.6 C) 98.3 F (36.8 C)    Dorsiflexion/Plantar flexion intact Incision: dressing C/D/I No cellulitis present Compartment soft  LABS  Recent Labs  07/04/16 0404  HGB 10.6*  HCT 31.7*  WBC 9.0  PLT 182     Recent Labs  07/04/16 0404  NA 137  K 4.3  BUN 14  CREATININE 0.73  GLUCOSE 155*     Assessment/Plan: 1 Day Post-Op Procedure(s) (LRB): TOTAL KNEE BILATERAL ARTHROPLASTY (Bilateral) LEFT KNEE HARDWARE REMOVAL (Left) Foley cath d/c'ed Advance diet Up with therapy D/C IV fluids Discharge home with home health, when ready      West Pugh. Kady Toothaker   PAC  07/04/2016, 7:57 AM

## 2016-07-05 LAB — CBC
HEMATOCRIT: 24.8 % — AB (ref 36.0–46.0)
HEMOGLOBIN: 8.2 g/dL — AB (ref 12.0–15.0)
MCH: 30.7 pg (ref 26.0–34.0)
MCHC: 33.1 g/dL (ref 30.0–36.0)
MCV: 92.9 fL (ref 78.0–100.0)
Platelets: 155 10*3/uL (ref 150–400)
RBC: 2.67 MIL/uL — AB (ref 3.87–5.11)
RDW: 13 % (ref 11.5–15.5)
WBC: 9.1 10*3/uL (ref 4.0–10.5)

## 2016-07-05 LAB — BASIC METABOLIC PANEL
ANION GAP: 3 — AB (ref 5–15)
BUN: 16 mg/dL (ref 6–20)
CHLORIDE: 108 mmol/L (ref 101–111)
CO2: 27 mmol/L (ref 22–32)
Calcium: 8.2 mg/dL — ABNORMAL LOW (ref 8.9–10.3)
Creatinine, Ser: 0.88 mg/dL (ref 0.44–1.00)
GFR calc Af Amer: >60 mL/min (ref >60)
GLUCOSE: 116 mg/dL — AB (ref 65–99)
POTASSIUM: 4.1 mmol/L (ref 3.5–5.1)
Sodium: 138 mmol/L (ref 135–145)

## 2016-07-05 NOTE — PMR Pre-admission (Signed)
[place] PM[facility] Admission Coordinator Pre-Admission Assessment  Patient: [name] is an 65 y.o., female MRN: [id number] DOB: 1950-09-10 Height: 5' 7"  (170.2 cm) Weight: 68.9 kg (152 lb)  Insurance Information HMO:    PPO:      PCP:      IPA:      80/20:      OTHER:  PRIMARY:  BCBS [place]      Policy#:  [id number]      Subscriber:  spouse CM Name:  Merrilyn Puma      Phone#:  [phone]    Fax#:  [phone] Pre-Cert#:  [id number], approved for 07/06/16 - 07/20/16  With clinical updates due to Merrilyn Puma on 07/19/16    Employer:  Pt. self employed Benefits:  Phone #:  [phone]     Name:  online Eff. Date:  08/05/15     Deduct:  $5000 (met $5000)      Out of Pocket Max:  $7500 (met [phone])      Life Max:  n/a CIR:  60%/40%      SNF:  60%/40% Outpatient:  60%     Co-Pay: 40%   Home Health:  60%      Co-Pay: 40%  DME:  60%     Co-Pay: 40%  Providers:  In network  [facility] [place]    Name Lares Spouse [phone]      Current Medical History  Patient Admitting Diagnosis:  Bilateral knee osteoarthritis  History of Present Illness: A 65 year old right-handed female with unremarkable past medical history on no prescription medications. Per chart review patient lives with spouse independent prior to admission occasionally using a walker. Husband works during the day. Presented 06/30/2016 with bilateral knee pain secondary to osteoarthritis. Patient has failed nonsurgical conservative treatments including NSAIDs, corticosteroid injections and activity modification. No relief with conservative care and underwent bilateral TKA 07/03/2016 per Dr. [name]. Weightbearing as tolerated bilateral lower extremities. Hospital course pain management. Placed on Xarelto for DVT prophylaxis. Acute blood loss anemia 7.1 transfused 2 units of packed red blood cells improved to 9.7.  Physical and occupational therapy evaluations completed with recommendations of physical medicine rehabilitation consult. Patient to be admitted for comprehensive inpatient rehabilitation program.   Patient's medical record from [facility] has been reviewed by the rehabilitation admission coordinator and physician.  Past Medical History  Past Medical History:  Diagnosis Date  . Arthritis    osteoarthritis knees mostly    Family History   family history is not on file.  Prior Rehab/Hospitalizations Has the patient had major surgery during 100 days prior to admission? No  Current Medications  see MAR  Patients Current Diet:   Regular diet, thin liquids  Precautions / Restrictions Precautions Precautions: Knee, Fall Precaution Comments: used L KI, hypotension Restrictions Weight Bearing Restrictions: No   Has the patient had 2 or more falls or a fall with injury in the past year?Pt. Had one fall within the year when she jumped off a small wall, no significant injuries reported  Prior Activity Level Community (5-7x/wk): Pt. describes herself as active and working full time.  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 [place] twice weekly (1 1/2 hours away)  Pt. plays tennis and golf regualrly and is an avid sports fan.  Prior Functional Level Self Care: Did the patient need help bathing, dressing, using the toilet or eating?  Independent  Indoor Mobility: Did the patient need assistance with walking from room to room (with or without device)? Independent  Stairs: Did the patient need assistance with internal or external stairs (with or without device)? Independent  Functional Cognition: Did the patient need help planning regular tasks such as shopping or remembering to take medications? Independent  Home Assistive Devices / Equipment Home Assistive Devices/Equipment: Contact lenses, Eyeglasses, Environmental consultant (specify type) Home Equipment: Bedside  commode, Walker - 2 wheels (AE)  Prior Device Use: Indicate devices/aids used by the patient prior to current illness, exacerbation or injury? None    Prior Functional Level Current Functional Level  Bed Mobility  Independent  Min assist   Transfers  Independent  Mod assist (mod assist +2)   Mobility - Walk/Wheelchair  Independent  Mod assist (20' +2 mod assist)   Upper Body Dressing  Independent  Other (set up)   Lower Body Dressing  Independent  Max assist (+2 max assist)   Grooming  Independent  Other (set  up)   Eating/Drinking  Independent  Independent   Toilet Transfer  Independent  Min assist (+2 min assist)   Bladder Continence   continent  has foley catheter, to be removed 07/07/16   Bowel Management  continent  last BM 07/02/16     Stair Climbing   independent  Other (not tested)   Communication  WNLs  WNLs   Memory  WNLs  WNLs   Cooking/Meal Prep  independent      Housework  independent    Money Management  independent    Driving   independent      Special needs/care consideration BiPAP/CPAP  no CPM  no Continuous Drip IV  no Dialysis  no         Life Vest  no Oxygen  no Special Bed   no Trach Size   no Wound Vac (area)  no       Skin  Bilateral knee incisions with ace wrap dressings in place                               Bowel mgmt:   Last BM 07/02/16 Bladder mgmt:  Has foley catheter, to be removed 07/07/16 Diabetic mgmt n/a  Previous Home Environment Living Arrangements: Spouse/significant other Available Help at Discharge: Available 24 hours/day (pt. states husband can take time off from work as needed) Type of Home: House Home Layout: Multi-level, 1/2 bath on main level Alternate Level Stairs-Number of Steps: flight Home Access: Stairs to enter Technical brewer of Steps: 1 Bathroom Shower/Tub: Multimedia programmer: Standard Bathroom Accessibility: Yes How Accessible: Accessible via  walker [place]: No  Discharge Living Setting Plans for Discharge Living Setting: Patient's home Type of Home at Discharge: House Discharge Home Layout: Multi-level, 1/2 bath on main level Alternate Level Stairs-Number of Steps: flight Discharge Home Access: Stairs to enter Entrance Stairs-Number of Steps: 1 Discharge Bathroom Shower/Tub: Walk-in shower Discharge Bathroom Toilet: Handicapped height Discharge Bathroom Accessibility: Yes How Accessible: Accessible via walker Does the patient have any problems obtaining your medications?: No  Social/Family/Support Systems Patient Roles: Spouse, Parent (pt. has one daughter) Anticipated Caregiver: Yanni Ruberg, husband Anticipated Caregiver's Contact Information: [phone] (cell) Ability/Limitations of Caregiver: husband works long hours per pt. but can take time off as needed Caregiver Availability: 24/7 Discharge Plan Discussed with Primary Caregiver: No (pt.'s mentation WNLs) Does Caregiver/Family have Issues with Lodging/Transportation while Pt is in Rehab?:  No  Goals/Additional Needs Patient/Family Goal for Rehab: modified independent and supervision PT/OT; n/a SLP Expected length of stay: 5-7 days Cultural Considerations: n/a Dietary Needs: regular diet, thin liquids Equipment Needs: TBA Additional Information: Pt. states she has numerous friends who have volunteered to stay with pt. if husband has to run out Pt/Family Agrees to Admission and willing to participate: Yes Program Orientation Provided & Reviewed with Pt/Caregiver Including Roles  & Responsibilities: Yes  Patient Condition: Pt. was admitted to [facility] on 07/03/16 with bilateral knee osteoarthritis and underwent bilateral TKAs on same date.  PMH includes arthritis but is largely noncontributory.  Pt. Is undergoing acute PT and OT currently and will benefit from and can tolerate an inpatient rehab admission to maximize her functional recovery  for discharge home.    Preadmission Screen Completed By:  Jamse Arn, 07/07/2016 9:40 AM ______________________________________________________________________   Discussed status with Dr. Posey Pronto on 07/07/16 at [phone] and received telephone approval for admission today.  Admission Coordinator:  Bhavik Cabiness Lorie Phenix, time 0916/Date 07/07/16   Assessment/Plan: Diagnosis: Bilateral knee osteoarthritis s/p b/l TKA  1. Does the need for close, 24 hr/day  Medical supervision in concert with the patient's rehab needs make it unreasonable for this patient to be served in a less intensive setting? Yes 2. Co-Morbidities requiring supervision/potential complications: pain management, Acute blood loss anemia 3. Due to bladder management, skin/wound care, disease management, pain management and patient education, does the patient require 24 hr/day rehab nursing? Yes 4. Does the patient require coordinated care of a physician, rehab nurse, PT (1-2 hrs/day, 5 days/week) and OT (1-2 hrs/day, 5 days/week) to address physical and functional deficits in the context of the above medical diagnosis(es)? Yes Addressing deficits in the following areas: balance, endurance, locomotion, strength, transferring, bathing, dressing, toileting and psychosocial support 5. Can the patient actively participate in an intensive therapy program of at least 3 hrs of therapy 5 days a week? Yes 6. The potential for patient to make measurable gains while on inpatient rehab is excellent 7. Anticipated functional outcomes upon discharge from inpatients are: modified independent PT, modified independent and supervision OT, n/a SLP 8. Estimated rehab length of stay to reach the above functional goals is: 8-12 days. 9. Does the patient have adequate social supports to accommodate these discharge functional goals? Yes 10. Anticipated D/C setting: Home 11. Anticipated post D/C treatments: HH therapy and Home excercise program 12. Overall  Rehab/Functional Prognosis: excellent    RECOMMENDATIONS: This patient's condition is appropriate for continued rehabilitative care in the following setting: CIR Patient has agreed to participate in recommended program. Yes Note that insurance prior authorization may be required for reimbursement for recommended care.  Shante Archambeault Lorie Phenix 07/07/2016

## 2016-07-05 NOTE — Progress Notes (Signed)
Physical Therapy Treatment Patient Details Name: [name] MRN: [id number] DOB: 03-Apr-1951 Today's Date: 07/05/2016    History of Present Illness s/p bil TKA    PT Comments    The patient reports feeling dizzy and not feeling well. The BP was monitored throughout. Supine with dynamap=74/41, Manual= 108/48. Stood from bed, she felt dizzy but well enough to ambulate x 20'. Close monitoring throughout. Assisted to the recliner. Manual BP 80/56. RN aware. Patient reports she has eaten very little since surgery.  Recommend CIR.  Follow Up Recommendations  CIR;Supervision/Assistance - 24 hour     Equipment Recommendations  None recommended by PT    Recommendations for Other Services       Precautions / Restrictions Precautions Precautions: Knee;Fall Precaution Comments: used L KI, hypotension Restrictions Weight Bearing Restrictions: No    Mobility  Bed Mobility Overal bed mobility: Needs Assistance Bed Mobility: Supine to Sit     Supine to sit: Min assist Sit to supine: Min assist;HOB elevated   General bed mobility comments: self assisting legs, extra time  Transfers Overall transfer level: Needs assistance Equipment used: Rolling walker (2 wheeled) Transfers: Sit to/from Stand Sit to Stand: Mod assist;From elevated surface;+2 safety/equipment Stand pivot transfers: +2 safety/equipment       General transfer comment: bed raised to facilitate standing . Assist to stand, staeady assist to  stabilize to get trunk more upright.  Ambulation/Gait Ambulation/Gait assistance: Mod assist;+2 safety/equipment Ambulation Distance (Feet): 20 Feet Assistive device: Rolling walker (2 wheeled) Gait Pattern/deviations: Step-to pattern;Trunk flexed     General Gait Details: cues for sequence,   c/o dizziness,  Cues for position inside the RW.   Stairs            Wheelchair Mobility    Modified Rankin (Stroke Patients Only)       Balance                                     Cognition Arousal/Alertness: Awake/alert Behavior During Therapy: WFL for tasks assessed/performed Overall Cognitive Status: Within Functional Limits for tasks assessed                      Exercises Total Joint Exercises Ankle Circles/Pumps: AROM;Both;20 reps;Supine Quad Sets: AROM;Both;20 reps;Supine Towel Squeeze: AROM;Both;20 reps;Supine Short Arc Quad: AROM;Both;20 reps;Supine Heel Slides: AAROM;Both;20 reps;Supine Hip ABduction/ADduction: AROM;Both;20 reps;Supine Straight Leg Raises: AAROM;Both;20 reps;Supine    General Comments        Pertinent Vitals/Pain Pain Assessment: 0-10 Pain Score: 4  Pain Location: bil knees Pain Descriptors / Indicators: Discomfort;Grimacing;Tightness Pain Intervention(s): Monitored during session;Premedicated before session;Repositioned    Home Living                      Prior Function            PT Goals (current goals can now be found in the care plan section) Progress towards PT goals: Progressing toward goals    Frequency    7X/week      PT Plan Current plan remains appropriate    Co-evaluation             End of Session Equipment Utilized During Treatment: Left knee immobilizer Activity Tolerance: Patient limited by fatigue;Treatment limited secondary to medical complications (Comment) (dizziness, low BP) Patient left: in chair;with call bell/phone within reach;with chair alarm set     Time: 1431-1510 PT  Time Calculation (min) (ACUTE ONLY): 39 min  Charges:  $Gait Training: 23-37 mins $Therapeutic Exercise: 38-52 mins $Therapeutic Activity: 8-22 mins                    G Codes:      Claretha Cooper 07/05/2016, 3:21 PM [place] PT [phone]

## 2016-07-05 NOTE — Progress Notes (Signed)
Physical Therapy Treatment Patient Details Name: [name] MRN: [id number] DOB: March 01, 1951 Today's Date: 07/05/2016    History of Present Illness s/p bil TKA    PT Comments    Patient dizzy sitting on bed edge with BP 104/41 sitting. After return to supine, BP 99/54. RN aware and will give more fluids/ Will attempt ambulation this PM.  Follow Up Recommendations  CIR;Supervision/Assistance - 24 hour     Equipment Recommendations  None recommended by PT    Recommendations for Other Services       Precautions / Restrictions Precautions Precautions: Knee;Fall Precaution Comments: used L KI, Restrictions Weight Bearing Restrictions: No    Mobility  Bed Mobility   Bed Mobility: Sit to Supine       Sit to supine: Min assist;HOB elevated   General bed mobility comments: patient olaced both legs onto the bed without asist.  Transfers                 General transfer comment: unable due to BP  Ambulation/Gait                 Stairs            Wheelchair Mobility    Modified Rankin (Stroke Patients Only)       Balance                                    Cognition Arousal/Alertness: Awake/alert Behavior During Therapy: WFL for tasks assessed/performed Overall Cognitive Status: Within Functional Limits for tasks assessed                      Exercises Total Joint Exercises Ankle Circles/Pumps: AROM;Both;20 reps;Supine Quad Sets: AROM;Both;20 reps;Supine Towel Squeeze: AROM;Both;20 reps;Supine Short Arc Quad: AROM;Both;20 reps;Supine Heel Slides: AAROM;Both;20 reps;Supine Hip ABduction/ADduction: AROM;Both;20 reps;Supine Straight Leg Raises: AAROM;Both;20 reps;Supine    General Comments        Pertinent Vitals/Pain Pain Assessment: 0-10 Pain Score: 5  Pain Location: bil knees Pain Descriptors / Indicators: Aching;Tightness Pain Intervention(s): Premedicated before session;Repositioned;Ice  applied;Monitored during session    Home Living                      Prior Function            PT Goals (current goals can now be found in the care plan section) Progress towards PT goals: Progressing toward goals    Frequency    7X/week      PT Plan Current plan remains appropriate    Co-evaluation             End of Session   Activity Tolerance: Patient tolerated treatment well;Treatment limited secondary to medical complications (Comment) (mobility) Patient left: in bed;with call bell/phone within reach     Time: [id number] PT Time Calculation (min) (ACUTE ONLY): 40 min  Charges:  $Therapeutic Exercise: 38-52 mins                    G Codes:      Claretha Cooper 07/05/2016, 1:14 PM Tresa Endo PT [phone]

## 2016-07-05 NOTE — Progress Notes (Signed)
   07/05/16 1100  Clinical Encounter Type  Visited With Patient  Visit Type Initial  Referral From Chaplain  Consult/Referral To [facility] visited with patient.  No needs at this time. Roe Coombs 07/05/16

## 2016-07-05 NOTE — Progress Notes (Signed)
Inpatient Rehabilitation  I have spoken with the patient and she wishes for me to initiate insurance authorization process for a possible IP rehab admission.  I have done so and will await a BCBS decision for a possible admission tomorrow if medically ready, if she still demonstrates need for CIR and if bed is available.  Please call if questions.  [place] Admissions Coordinator Cell [phone] Office [phone]

## 2016-07-05 NOTE — Progress Notes (Signed)
     Subjective: 2 Days Post-Op Procedure(s) (LRB): TOTAL KNEE BILATERAL ARTHROPLASTY (Bilateral) LEFT KNEE HARDWARE REMOVAL (Left)   Patient reports pain as moderate, pain controlled. Feels more pain, but less dizziness since taking less pain medication.  No events throughout the night.  Requested a CIR consult.  Objective:   VITALS:   Vitals:   07/04/16 2220 07/05/16 0457  BP: (!) 98/49 (!) 96/53  Pulse: 78 77  Resp: 16 18  Temp: 98.3 F (36.8 C) 98.5 F (36.9 C)    Dorsiflexion/Plantar flexion intact Incision: dressing C/D/I No cellulitis present Compartment soft  LABS  Recent Labs  07/04/16 0404 07/05/16 0416  HGB 10.6* 8.2*  HCT 31.7* 24.8*  WBC 9.0 9.1  PLT 182 155     Recent Labs  07/04/16 0404 07/05/16 0416  NA 137 138  K 4.3 4.1  BUN 14 16  CREATININE 0.73 0.88  GLUCOSE 155* 116*     Assessment/Plan: 2 Days Post-Op Procedure(s) (LRB): TOTAL KNEE BILATERAL ARTHROPLASTY (Bilateral) LEFT KNEE HARDWARE REMOVAL (Left) Up with therapy Discharge disposition to be determined     [place]. Trevell Pariseau   PAC  07/05/2016, 8:39 AM

## 2016-07-05 NOTE — Progress Notes (Signed)
Physical Therapy Treatment late entry for 07/04/16 Patient Details Name: [name] MRN: [id number] DOB: 1950/11/16 Today's Date: 07/05/2016    History of Present Illness s/p bil TKA    PT Comments    The  Patient was not dizzy this visit and ambulated a short distance with c/o of increased pain. Recommend CIR   Follow Up Recommendations  CIR;Supervision/Assistance - 24 hour     Equipment Recommendations  None recommended by PT    Recommendations for Other Services       Precautions / Restrictions Precautions Precautions: Knee;Fall Precaution Comments: used L KI,    Mobility  Bed Mobility Overal bed mobility: Needs Assistance Bed Mobility: Sit to Supine       Sit to supine: Min assist   General bed mobility comments: assist for LLE  Transfers Overall transfer level: Needs assistance Equipment used: Rolling walker (2 wheeled) Transfers: Sit to/from Stand Sit to Stand: Mod assist         General transfer comment: assist to rise and steady. Cues for UE/LE placement from recliner  Ambulation/Gait Ambulation/Gait assistance: Mod assist;+2 safety/equipment Ambulation Distance (Feet): 20 Feet Assistive device: Rolling walker (2 wheeled) Gait Pattern/deviations: Step-to pattern     General Gait Details: cues for sequence, no  c/o dizziness,  Cues for position inside the RW.   Stairs            Wheelchair Mobility    Modified Rankin (Stroke Patients Only)       Balance           Standing balance support: During functional activity;Bilateral upper extremity supported Standing balance-Leahy Scale: Poor                      Cognition Arousal/Alertness: Awake/alert                          Exercises      General Comments        Pertinent Vitals/Pain Pain Location: % left , 6 right Pain Descriptors / Indicators: Aching Pain Intervention(s): Premedicated before session;Monitored during session;Limited activity  within patient's tolerance;Ice applied    Home Living                      Prior Function            PT Goals (current goals can now be found in the care plan section) Progress towards PT goals: Progressing toward goals    Frequency    7X/week      PT Plan Current plan remains appropriate    Co-evaluation             End of Session Equipment Utilized During Treatment: Gait belt Activity Tolerance: Patient tolerated treatment well Patient left: in bed;with call bell/phone within reach     Time:  [id number]    Charges:    2 gait                   G Codes:      Claretha Cooper 07/04/2016,

## 2016-07-05 NOTE — Progress Notes (Signed)
Occupational Therapy Treatment Patient Details Name: [name] MRN: [id number] DOB: August 27, 1951 Today's Date: 07/05/2016    History of present illness s/p bil TKA   OT comments  Decreased activity due to low BP this session. Used leg lifter this session  Follow Up Recommendations  CIR    Equipment Recommendations  None recommended by OT    Recommendations for Other Services      Precautions / Restrictions Precautions Precautions: Knee;Fall Precaution Comments: used L KI, Restrictions Weight Bearing Restrictions: No       Mobility Bed Mobility           Sit to supine: Min assist;HOB elevated   General bed mobility comments: used leg lifter, alternating R and LLE  Transfers                 General transfer comment: unable due to BP    Balance                                   ADL                                         General ADL Comments: pt sat EOB.  See orthostatic BPs around 10 am.  Pt had soft BPs and UE exercise EOB did not raise it significantly.  Returned to supine. Pt used leg lifter to get OOB; min A required for bed mobility      Vision                     Perception     Praxis      Cognition   Behavior During Therapy: [facility] for tasks assessed/performed Overall Cognitive Status: Within Functional Limits for tasks assessed                       Extremity/Trunk Assessment               Exercises     Shoulder Instructions       General Comments      Pertinent Vitals/ Pain       Pain Assessment: 0-10 Pain Score: 6  Pain Location: bil knee/thighs Pain Descriptors / Indicators: Aching Pain Intervention(s): Limited activity within patient's tolerance;Monitored during session;Premedicated before session;Repositioned (NT getting ice)  Home Living                                          Prior Functioning/Environment              Frequency  Min  2X/week        Progress Toward Goals  OT Goals(current goals can now be found in the care plan section)  Progress towards OT goals: Not progressing toward goals - comment (due to decreased BP)     Plan      Co-evaluation                 End of Session     Activity Tolerance Treatment limited secondary to medical complications (Comment) (BP)   Patient Left in bed;with call bell/phone within reach (PT (overlapped a few minutes))   Nurse Communication  Time: [id number] OT Time Calculation (min): 29 min  Charges: OT General Charges $OT Visit: 1 Procedure OT Treatments $Therapeutic Activity: 8-22 mins  Aaniyah Strohm 07/05/2016, 12:47 PM  Lesle Chris, OTR/L [phone] 07/05/2016

## 2016-07-05 NOTE — Progress Notes (Addendum)
CSW met with pt this afternoon to offer assistance with d/c planning.Pt is hopeful for CIR admission. Pt reports that if she is unable to have rehab at CIR she will plan to return home. CSW will remain available to assist with d/c planning as needed.  Werner Lean LCSW [phone]

## 2016-07-05 NOTE — Progress Notes (Signed)
Inpatient Rehabilitation  I visited the patient at the bedside to provide further information regarding the IP Rehab program.  I provided her with informational booklets and answered her questions.  I have received insurance approval from [facility] to admit pt. to IP rehab when she is medically ready. I left voicemail updates for Mariane Masters, RNCM and Werner Lean, CSW  Karene Fry will follow up in my absence tomorrow.  Please call if questions.  [place] Admissions Coordinator Cell [phone] Office [phone]

## 2016-07-06 DIAGNOSIS — D62 Acute posthemorrhagic anemia: Secondary | ICD-10-CM | POA: Diagnosis not present

## 2016-07-06 LAB — BASIC METABOLIC PANEL
Anion gap: 3 — ABNORMAL LOW (ref 5–15)
BUN: 12 mg/dL (ref 6–20)
CALCIUM: 7.9 mg/dL — AB (ref 8.9–10.3)
CHLORIDE: 107 mmol/L (ref 101–111)
CO2: 28 mmol/L (ref 22–32)
CREATININE: 0.75 mg/dL (ref 0.44–1.00)
Glucose, Bld: 103 mg/dL — ABNORMAL HIGH (ref 65–99)
Potassium: 4.1 mmol/L (ref 3.5–5.1)
SODIUM: 138 mmol/L (ref 135–145)

## 2016-07-06 LAB — CBC
HCT: 21.3 % — ABNORMAL LOW (ref 36.0–46.0)
Hemoglobin: 7.1 g/dL — ABNORMAL LOW (ref 12.0–15.0)
MCH: 31 pg (ref 26.0–34.0)
MCHC: 33.3 g/dL (ref 30.0–36.0)
MCV: 93 fL (ref 78.0–100.0)
PLATELETS: 146 10*3/uL — AB (ref 150–400)
RBC: 2.29 MIL/uL — ABNORMAL LOW (ref 3.87–5.11)
RDW: 13 % (ref 11.5–15.5)
WBC: 6.5 10*3/uL (ref 4.0–10.5)

## 2016-07-06 LAB — PREPARE RBC (CROSSMATCH)

## 2016-07-06 MED ORDER — SODIUM CHLORIDE 0.9 % IV SOLN
Freq: Once | INTRAVENOUS | Status: AC
  Administered 2016-07-06: 10:00:00 via INTRAVENOUS

## 2016-07-06 NOTE — Progress Notes (Signed)
     Subjective: 3 Days Post-Op Procedure(s) (LRB): TOTAL KNEE BILATERAL ARTHROPLASTY (Bilateral) LEFT KNEE HARDWARE REMOVAL (Left)   Patient reports pain as moderate, pain not fully controlled. Patient is trying to use less medication do some of the symptoms she is having, including dizziness and feeling light headed. Discussion was had with regards to transfusing 2 units of blood.  After discussing her symptoms, hypotension and labs she is will to get blood.  Discussed going to CIR, this will probably happen tomorrow, if stable.  Objective:   VITALS:   Vitals:   07/05/16 2243 07/06/16 0600  BP: (!) 100/52 (!) 106/54  Pulse: 97 84  Resp: 16 16  Temp: 98.9 F (37.2 C) 99.2 F (37.3 C)    Dorsiflexion/Plantar flexion intact Incision: dressing C/D/I No cellulitis present Compartment soft  LABS  Recent Labs  07/04/16 0404 07/05/16 0416 07/06/16 0405  HGB 10.6* 8.2* 7.1*  HCT 31.7* 24.8* 21.3*  WBC 9.0 9.1 6.5  PLT 182 155 146*     Recent Labs  07/04/16 0404 07/05/16 0416 07/06/16 0405  NA 137 138 138  K 4.3 4.1 4.1  BUN 14 16 12   CREATININE 0.73 0.88 0.75  GLUCOSE 155* 116* 103*     Assessment/Plan: 3 Days Post-Op Procedure(s) (LRB): TOTAL KNEE BILATERAL ARTHROPLASTY (Bilateral) LEFT KNEE HARDWARE REMOVAL (Left) Keep foley cath because of giving blood. Up with therapy Plan for discharge tomorrow to CIR  ABLA  Giving 2 units of blood today. Will obtain labs to reevaluate tomorrow morning. Treated with iron and will observe            West Pugh. Alistar Mcenery   PAC  07/06/2016, 7:49 AM

## 2016-07-06 NOTE — Progress Notes (Signed)
Physical Therapy Treatment Patient Details Name: [name] MRN: [id number] DOB: 02/23/1951 Today's Date: 07/06/2016    History of Present Illness s/p bil TKA    PT Comments    Patient was seen in recliner upon arrival. Patient was undergoing a blood tranfusion and port was located in L hand. Was unable to attempt gait due to blood transfusion. Performed BLE AROM and AAROM exercises. Required assistance with Straight leg raise and knee flexion. Pain was a 4/10 upon arrival and increased to a 6/10 during short arc quads and a 8/10 according to facial pain scale during knee flexion. Educated patient on proper bed positioning by putting pillows under the calf instead of knees and lateral aspect of thighs. No complaints of dizziness or lightheadedness in today's session, however patient stated that her pain was a 10/10 this morning and "felt sick on her stomach." Nursing notified of treatment performed today.   Follow Up Recommendations  CIR;Supervision/Assistance - 24 hour     Equipment Recommendations  Rolling walker with 5" wheels    Recommendations for Other Services       Precautions / Restrictions Precautions Precautions: Knee;Fall Restrictions Weight Bearing Restrictions: No    Mobility  Bed Mobility      NT OOB in recliner            Transfers        NT 2nd unit of blood current transfusing with IV site right at bend of wrist            Ambulation/Gait        NT         Stairs            Wheelchair Mobility    Modified Rankin (Stroke Patients Only)       Balance                                    Cognition Arousal/Alertness: Awake/alert Behavior During Therapy: WFL for tasks assessed/performed Overall Cognitive Status: Within Functional Limits for tasks assessed                      Exercises Total Joint Exercises Ankle Circles/Pumps: AROM;Both;Supine;10 reps Quad Sets: AROM;Both;Supine;10  reps Towel Squeeze: AROM;Both;Supine;10 reps Short Arc Quad: Both;Supine;AROM;10 reps Heel Slides: AAROM;Both;Supine;10 reps Hip ABduction/ADduction: AROM;Both;Supine;10 reps Straight Leg Raises: AAROM;Both;Supine;10 reps    General Comments        Pertinent Vitals/Pain Pain Assessment: 0-10 Pain Score: 4  Pain Location: bil knees; R>L. Increased to a 6/10 during SAQ and 8/10 during AAROM knee flexion. Pain Descriptors / Indicators: Discomfort;Grimacing Pain Intervention(s): Monitored during session;Repositioned;Ice applied;Limited activity within patient's tolerance    Home Living                      Prior Function            PT Goals (current goals can now be found in the care plan section) Progress towards PT goals: Progressing toward goals    Frequency    7X/week      PT Plan Current plan remains appropriate    Co-evaluation     PT goals addressed during session: Strengthening/ROM       End of Session   Activity Tolerance:  (Not able to perform gait due to blood tranfusion port in L hand.) Patient left: in chair;with call bell/phone within reach  Time: [id number] PT Time Calculation (min) (ACUTE ONLY): 34 min  Charges:  $Therapeutic Exercise: 23-37 mins                    G Codes:      Hall Busing, SPTA [facility] [phone]  Present and agree with above  Rica Koyanagi  PTA [facility]  Acute  Rehab Pager      [phone]

## 2016-07-06 NOTE — Progress Notes (Signed)
Rehab admissions - I met with patient this am and I spoke with ortho team.  Patient will need blood transfusion today.  I will check back in am to see if patient is medically ready for discharge and admit to inpatient rehab for tomorrow.  Call me for questions.  #[phone]

## 2016-07-06 NOTE — Progress Notes (Signed)
CM received notification pt has been approved and insurance authorized CIR; Eugenia from [facility] notified me this am plan is for 07/07/16 (as pt receiving blood today).  [facility] rep, Octavia Bruckner was notified for change of plan.  No other CM needs were communicated.

## 2016-07-07 ENCOUNTER — Inpatient Hospital Stay (HOSPITAL_COMMUNITY)
Admission: RE | Admit: 2016-07-07 | Discharge: 2016-07-15 | DRG: 560 | Disposition: A | Discharge status: Home / Self Care | Payer: BLUE CROSS/BLUE SHIELD | Source: Intra-hospital | Attending: Physical Medicine & Rehabilitation | Admitting: Physical Medicine & Rehabilitation

## 2016-07-07 DIAGNOSIS — E8809 Other disorders of plasma-protein metabolism, not elsewhere classified: Secondary | ICD-10-CM | POA: Diagnosis not present

## 2016-07-07 DIAGNOSIS — R269 Unspecified abnormalities of gait and mobility: Secondary | ICD-10-CM

## 2016-07-07 DIAGNOSIS — G8918 Other acute postprocedural pain: Secondary | ICD-10-CM

## 2016-07-07 DIAGNOSIS — K5900 Constipation, unspecified: Secondary | ICD-10-CM

## 2016-07-07 DIAGNOSIS — Z79899 Other long term (current) drug therapy: Secondary | ICD-10-CM

## 2016-07-07 DIAGNOSIS — D62 Acute posthemorrhagic anemia: Secondary | ICD-10-CM

## 2016-07-07 DIAGNOSIS — N3000 Acute cystitis without hematuria: Secondary | ICD-10-CM | POA: Diagnosis not present

## 2016-07-07 DIAGNOSIS — R300 Dysuria: Secondary | ICD-10-CM | POA: Diagnosis not present

## 2016-07-07 DIAGNOSIS — H6092 Unspecified otitis externa, left ear: Secondary | ICD-10-CM

## 2016-07-07 DIAGNOSIS — M79609 Pain in unspecified limb: Secondary | ICD-10-CM | POA: Diagnosis not present

## 2016-07-07 DIAGNOSIS — Z471 Aftercare following joint replacement surgery: Secondary | ICD-10-CM | POA: Diagnosis present

## 2016-07-07 DIAGNOSIS — Z96653 Presence of artificial knee joint, bilateral: Secondary | ICD-10-CM | POA: Diagnosis not present

## 2016-07-07 DIAGNOSIS — K5903 Drug induced constipation: Secondary | ICD-10-CM | POA: Diagnosis not present

## 2016-07-07 DIAGNOSIS — R52 Pain, unspecified: Secondary | ICD-10-CM

## 2016-07-07 DIAGNOSIS — R609 Edema, unspecified: Secondary | ICD-10-CM | POA: Diagnosis not present

## 2016-07-07 LAB — TYPE AND SCREEN
ABO/RH(D): A POS
ANTIBODY SCREEN: NEGATIVE
UNIT DIVISION: 0
Unit division: 0

## 2016-07-07 LAB — CBC
HEMATOCRIT: 28 % — AB (ref 36.0–46.0)
Hemoglobin: 9.7 g/dL — ABNORMAL LOW (ref 12.0–15.0)
MCH: 31.2 pg (ref 26.0–34.0)
MCHC: 34.6 g/dL (ref 30.0–36.0)
MCV: 90 fL (ref 78.0–100.0)
Platelets: 178 10*3/uL (ref 150–400)
RBC: 3.11 MIL/uL — ABNORMAL LOW (ref 3.87–5.11)
RDW: 13.5 % (ref 11.5–15.5)
WBC: 6.2 10*3/uL (ref 4.0–10.5)

## 2016-07-07 MED ORDER — ONDANSETRON HCL 4 MG/2ML IJ SOLN: 4.0000 mg | Freq: Four times a day (QID) | INTRAMUSCULAR | Status: DC | PRN

## 2016-07-07 MED ORDER — METHOCARBAMOL 500 MG PO TABS: 500.0000 mg | ORAL_TABLET | Freq: Four times a day (QID) | ORAL | 0 refills | Status: DC | PRN

## 2016-07-07 MED ORDER — CELECOXIB 200 MG PO CAPS
200.0000 mg | ORAL_CAPSULE | Freq: Two times a day (BID) | ORAL | Status: DC
  Administered 2016-07-07 – 2016-07-15 (×16): 200 mg via ORAL
  Filled 2016-07-07 (×16): qty 1

## 2016-07-07 MED ORDER — OXYCODONE HCL 5 MG PO TABS: 5.0000 mg | ORAL_TABLET | ORAL | Status: DC

## 2016-07-07 MED ORDER — OXYCODONE HCL 5 MG PO TABS: 5.0000 mg | ORAL_TABLET | ORAL | 0 refills | Status: DC | PRN

## 2016-07-07 MED ORDER — METHOCARBAMOL 500 MG PO TABS
500.0000 mg | ORAL_TABLET | Freq: Four times a day (QID) | ORAL | Status: DC | PRN
  Administered 2016-07-07 – 2016-07-15 (×20): 500 mg via ORAL
  Filled 2016-07-07 (×21): qty 1

## 2016-07-07 MED ORDER — RIVAROXABAN 10 MG PO TABS
10.0000 mg | ORAL_TABLET | ORAL | Status: DC
  Administered 2016-07-08 – 2016-07-15 (×8): 10 mg via ORAL
  Filled 2016-07-07 (×8): qty 1

## 2016-07-07 MED ORDER — DOCUSATE SODIUM 100 MG PO CAPS: 100.0000 mg | ORAL_CAPSULE | Freq: Two times a day (BID) | ORAL | 0 refills | Status: DC

## 2016-07-07 MED ORDER — POLYETHYLENE GLYCOL 3350 17 G PO PACK: 17.0000 g | PACK | Freq: Two times a day (BID) | ORAL | 0 refills | Status: DC

## 2016-07-07 MED ORDER — ASPIRIN 81 MG PO CHEW: 81.0000 mg | CHEWABLE_TABLET | Freq: Two times a day (BID) | ORAL | 0 refills | Status: DC

## 2016-07-07 MED ORDER — FERROUS SULFATE 325 (65 FE) MG PO TABS
325.0000 mg | ORAL_TABLET | Freq: Three times a day (TID) | ORAL | Status: DC
Start: 2016-07-07 — End: 2016-07-15
  Administered 2016-07-07 – 2016-07-15 (×24): 325 mg via ORAL
  Filled 2016-07-07 (×24): qty 1

## 2016-07-07 MED ORDER — BISACODYL 10 MG RE SUPP: 10.0000 mg | Freq: Every day | RECTAL | Status: DC | PRN

## 2016-07-07 MED ORDER — DOCUSATE SODIUM 100 MG PO CAPS
100.0000 mg | ORAL_CAPSULE | Freq: Two times a day (BID) | ORAL | Status: DC
  Administered 2016-07-07 – 2016-07-15 (×15): 100 mg via ORAL
  Filled 2016-07-07 (×16): qty 1

## 2016-07-07 MED ORDER — METHOCARBAMOL 1000 MG/10ML IJ SOLN
500.0000 mg | Freq: Four times a day (QID) | INTRAVENOUS | Status: DC | PRN
  Filled 2016-07-07: qty 5

## 2016-07-07 MED ORDER — OXYCODONE HCL 5 MG PO TABS
10.0000 mg | ORAL_TABLET | ORAL | Status: DC | PRN
  Administered 2016-07-07 – 2016-07-15 (×31): 10 mg via ORAL
  Filled 2016-07-07 (×31): qty 2

## 2016-07-07 MED ORDER — RIVAROXABAN 10 MG PO TABS: 10.0000 mg | ORAL_TABLET | ORAL | 0 refills | Status: DC

## 2016-07-07 MED ORDER — FERROUS SULFATE 325 (65 FE) MG PO TABS: 325.0000 mg | ORAL_TABLET | Freq: Three times a day (TID) | ORAL | 3 refills | Status: DC

## 2016-07-07 MED ORDER — POLYETHYLENE GLYCOL 3350 17 G PO PACK
17.0000 g | PACK | Freq: Two times a day (BID) | ORAL | Status: DC
  Administered 2016-07-07 – 2016-07-15 (×12): 17 g via ORAL
  Filled 2016-07-07 (×14): qty 1

## 2016-07-07 MED ORDER — SORBITOL 70 % SOLN: 30.0000 mL | Freq: Every day | Status: DC | PRN

## 2016-07-07 MED ORDER — ONDANSETRON HCL 4 MG PO TABS: 4.0000 mg | ORAL_TABLET | Freq: Four times a day (QID) | ORAL | Status: DC | PRN

## 2016-07-07 MED ORDER — ACETAMINOPHEN 500 MG PO TABS: 1000.0000 mg | ORAL_TABLET | Freq: Three times a day (TID) | ORAL | 0 refills | Status: DC

## 2016-07-07 NOTE — H&P (View-Only) (Signed)
Physical Medicine and Rehabilitation Admission H&P     HPI: 65 year old right-handed female with unremarkable past medical history on no prescription medications. Per chart review patient lives with spouse independent prior to admission occasionally using a walker. Husband works during the day. Presented 06/30/2016 with bilateral knee pain secondary to osteoarthritis. Patient has failed nonsurgical conservative treatments including NSAIDs, corticosteroid injections and activity modification. No relief with conservative care and underwent bilateral TKA 07/03/2016 per Dr. [name]. Weightbearing as tolerated bilateral lower extremities. Hospital course pain management. Placed on Xarelto for DVT prophylaxis. Acute blood loss anemia 7.1 transfused 2 units of packed red blood cells improved to 9.7. Physical and occupational therapy evaluations completed with recommendations of physical medicine rehabilitation consult. Patient was admitted for comprehensive rehabilitation program  Review of Systems  Constitutional: Negative for chills and fever.  HENT: Negative for hearing loss.   Eyes: Negative for blurred vision and double vision.  Respiratory: Negative for cough and shortness of breath.   Cardiovascular: Negative for chest pain, palpitations and leg swelling.  Gastrointestinal: Positive for constipation. Negative for nausea and vomiting.  Genitourinary: Negative for dysuria and hematuria.  Musculoskeletal: Positive for joint pain.  Skin: Negative for rash.  Neurological: Negative for seizures, weakness and headaches.  All other systems reviewed and are negative.  Past Medical History:  Diagnosis Date  . Arthritis    osteoarthritis knees mostly   Past Surgical History:  Procedure Laterality Date  . DIAGNOSTIC LAPAROSCOPY     ovarian cystectomy/ BSO done.  Marland Kitchen HARDWARE REMOVAL Left 07/03/2016   Procedure: LEFT KNEE HARDWARE REMOVAL;  Surgeon: Paralee Cancel, MD;  Location: [facility];  Service:  Orthopedics;  Laterality: Left;  . KNEE SURGERY Bilateral    ACL x2, meniscectomy x3, open patella(bone from other knee)  . ROTATOR CUFF REPAIR Right   . TOTAL KNEE ARTHROPLASTY Bilateral 07/03/2016   Procedure: TOTAL KNEE BILATERAL ARTHROPLASTY;  Surgeon: Paralee Cancel, MD;  Location: [facility];  Service: Orthopedics;  Laterality: Bilateral;   History reviewed. No pertinent family history. Social History:  reports that she has never smoked. She has never used smokeless tobacco. She reports that she drinks alcohol. She reports that she does not use drugs. Allergies:  Allergies  Allergen Reactions  . Penicillins Anaphylaxis and Swelling    Has patient had a PCN reaction causing immediate rash, facial/tongue/throat swelling, SOB or lightheadedness with hypotension:Yes Has patient had a PCN reaction causing severe rash involving mucus membranes or skin necrosis:No Has patient had a PCN reaction that required hospitalization:No Has patient had a PCN reaction occurring within the last 10 years:No If all of the above answers are "NO", then may proceed with Cephalosporin use.   . Tetracyclines & Related Hives   Medications Prior to Admission  Medication Sig Dispense Refill  . acetaminophen (TYLENOL) 500 MG tablet Take 2 tablets (1,000 mg total) by mouth every 8 (eight) hours. 30 tablet 0  . aspirin 81 MG chewable tablet Chew 1 tablet (81 mg total) by mouth 2 (two) times daily. Start the day after finishing the Xarelto.  Take for 4 weeks. 60 tablet 0  . docusate sodium (COLACE) 100 MG capsule Take 1 capsule (100 mg total) by mouth 2 (two) times daily. 10 capsule 0  . ferrous sulfate 325 (65 FE) MG tablet Take 1 tablet (325 mg total) by mouth 3 (three) times daily after meals.  3  . methocarbamol (ROBAXIN) 500 MG tablet Take 1 tablet (500 mg total) by mouth every 6 (six)  hours as needed for muscle spasms. 40 tablet 0  . oxyCODONE (OXY IR/ROXICODONE) 5 MG immediate release tablet Take 1-3 tablets (5-15  mg total) by mouth every 4 (four) hours as needed for severe pain. 90 tablet 0  . polyethylene glycol (MIRALAX / GLYCOLAX) packet Take 17 g by mouth 2 (two) times daily. 14 each 0  . rivaroxaban (XARELTO) 10 MG TABS tablet Take 1 tablet (10 mg total) by mouth daily. 14 tablet 0    Home: Home Living Family/patient expects to be discharged to:: Private residence Living Arrangements: Spouse/significant other Available Help at Discharge: Available 24 hours/day (pt. states husband can take time off from work as needed) Type of Home: House Home Access: Stairs to enter Technical brewer of Steps: 1 Home Layout: Multi-level, 1/2 bath on main level Alternate Level Stairs-Number of Steps: flight Bathroom Shower/Tub: Multimedia programmer: Standard Bathroom Accessibility: Yes Home Equipment: Bedside commode, Environmental consultant - 2 wheels (AE)   Functional History: Prior Function Level of Independence: Independent Comments: husband works during the day  Functional Status:  Mobility: Bed Mobility Overal bed mobility: Needs Assistance Bed Mobility: Supine to Sit Supine to sit: Min assist Sit to supine: Min assist, HOB elevated General bed mobility comments: self assisting legs, extra time Transfers Overall transfer level: Needs assistance Equipment used: Rolling walker (2 wheeled) Transfers: Sit to/from Stand Sit to Stand: Mod assist, From elevated surface, +2 safety/equipment Stand pivot transfers: +2 safety/equipment General transfer comment: bed raised to facilitate standing . Assist to stand, staeady assist to  stabilize to get trunk more upright. Ambulation/Gait Ambulation/Gait assistance: Mod assist, +2 safety/equipment Ambulation Distance (Feet): 20 Feet Assistive device: Rolling walker (2 wheeled) Gait Pattern/deviations: Step-to pattern, Trunk flexed General Gait Details: cues for sequence,   c/o dizziness,  Cues for position inside the RW.    ADL: ADL Overall ADL's :  Needs assistance/impaired Grooming: Set up, Sitting Upper Body Bathing: Set up, Sitting Lower Body Bathing: Moderate assistance, +2 for safety/equipment, Sit to/from stand Upper Body Dressing : Set up, Sitting Lower Body Dressing: Maximal assistance, +2 for safety/equipment, Sit to/from stand Toilet Transfer: Minimal assistance, +2 for safety/equipment, Stand-pivot, BSC, RW (couple of steps to chair) Toileting- Clothing Manipulation and Hygiene: Minimal assistance, +2 for safety/equipment, Sit to/from stand General ADL Comments: pt sat EOB.  See orthostatic BPs around 10 am.  Pt had soft BPs and UE exercise EOB did not raise it significantly.  Returned to supine. Pt used leg lifter to get OOB; min A required for bed mobility  Cognition: Cognition Overall Cognitive Status: Within Functional Limits for tasks assessed Orientation Level: Oriented X4 Cognition Arousal/Alertness: Awake/alert Behavior During Therapy: WFL for tasks assessed/performed Overall Cognitive Status: Within Functional Limits for tasks assessed  Physical Exam: Blood pressure 108/63, pulse 80, temperature 97.3 F (36.3 C), temperature source Axillary, resp. rate 16, height 5' 7" (1.702 m), weight 68.9 kg (152 lb), SpO2 98 %. Physical Exam  Vitals reviewed. Constitutional: She is oriented to person, place, and time. She appears well-developed and well-nourished.  Alert female with complaints of mild nausea.  HENT:  Head: Normocephalic and atraumatic.  Eyes: Conjunctivae and EOM are normal.  Neck: Normal range of motion. Neck supple. No thyromegaly present.  Cardiovascular: Normal rate and regular rhythm.   Respiratory: Effort normal and breath sounds normal. No respiratory distress.  GI: Soft. Bowel sounds are normal. She exhibits no distension.  Musculoskeletal: She exhibits edema and tenderness.  Neurological: She is alert and oriented to person, place, and  time.  Motor: B/l UE 5/5 B/l LE: HF 2/5, ankle  dorsi/plantar flexion 5/5 Sensation intact  Skin: Skin is warm and dry.  Bilateral knee incisions are dressed appropriately tender.  Psychiatric: She has a normal mood and affect. Her behavior is normal. Thought content normal.    Results for orders placed or performed during the hospital encounter of 07/03/16 (from the past 48 hour(s))  CBC     Status: Abnormal   Collection Time: 07/06/16  4:05 AM  Result Value Ref Range   WBC 6.5 4.0 - 10.5 K/uL   RBC 2.29 (L) 3.87 - 5.11 MIL/uL   Hemoglobin 7.1 (L) 12.0 - 15.0 g/dL   HCT 21.3 (L) 36.0 - 46.0 %   MCV 93.0 78.0 - 100.0 fL   MCH 31.0 26.0 - 34.0 pg   MCHC 33.3 30.0 - 36.0 g/dL   RDW 13.0 11.5 - 15.5 %   Platelets 146 (L) 150 - 400 K/uL  Basic metabolic panel     Status: Abnormal   Collection Time: 07/06/16  4:05 AM  Result Value Ref Range   Sodium 138 135 - 145 mmol/L   Potassium 4.1 3.5 - 5.1 mmol/L   Chloride 107 101 - 111 mmol/L   CO2 28 22 - 32 mmol/L   Glucose, Bld 103 (H) 65 - 99 mg/dL   BUN 12 6 - 20 mg/dL   Creatinine, Ser 0.75 0.44 - 1.00 mg/dL   Calcium 7.9 (L) 8.9 - 10.3 mg/dL   GFR calc non Af Amer >60 >60 mL/min   GFR calc Af Amer >60 >60 mL/min    Comment: (NOTE) The eGFR has been calculated using the CKD EPI equation. This calculation has not been validated in all clinical situations. eGFR's persistently <60 mL/min signify possible Chronic Kidney Disease.    Anion gap 3 (L) 5 - 15  Prepare RBC     Status: None   Collection Time: 07/06/16  8:05 AM  Result Value Ref Range   Order Confirmation ORDER PROCESSED BY BLOOD BANK   CBC     Status: Abnormal   Collection Time: 07/07/16  4:25 AM  Result Value Ref Range   WBC 6.2 4.0 - 10.5 K/uL   RBC 3.11 (L) 3.87 - 5.11 MIL/uL   Hemoglobin 9.7 (L) 12.0 - 15.0 g/dL    Comment: DELTA CHECK NOTED POST TRANSFUSION SPECIMEN    HCT 28.0 (L) 36.0 - 46.0 %   MCV 90.0 78.0 - 100.0 fL   MCH 31.2 26.0 - 34.0 pg   MCHC 34.6 30.0 - 36.0 g/dL   RDW 13.5 11.5 - 15.5 %    Platelets 178 150 - 400 K/uL   No results found.     Medical Problem List and Plan: 1.  Decreased functional mobility secondary to bilateral TKA 07/03/2016. Weightbearing as tolerated 2.  DVT Prophylaxis/Anticoagulation: Xarelto. Check vascular studies 3. Pain Management: Celebrex 200 mg twice a day. Oxycodone and Robaxin as needed 4. Acute blood loss anemia. Continue iron supplement. Follow-up CBC 5. Neuropsych: This patient is capable of making decisions on her own behalf. 6. Skin/Wound Care: Routine skin checks 7. Fluids/Electrolytes/Nutrition: Routine I&O with follow-up chemistries 8. Constipation. Laxative assistance   Post Admission Physician Evaluation: 1. Functional deficits secondary  to B/l TKA. 2. Patient is admitted to receive collaborative, interdisciplinary care between the physiatrist, rehab nursing staff, and therapy team. 3. Patient's level of medical complexity and substantial therapy needs in context of that medical necessity cannot be provided at  a lesser intensity of care such as a SNF. 4. Patient has experienced substantial functional loss from his/her baseline which was documented above under the "Functional History" and "Functional Status" headings.  Judging by the patient's diagnosis, physical exam, and functional history, the patient has potential for functional progress which will result in measurable gains while on inpatient rehab.  These gains will be of substantial and practical use upon discharge  in facilitating mobility and self-care at the household level. 5. Physiatrist will provide 24 hour management of medical needs as well as oversight of the therapy plan/treatment and provide guidance as appropriate regarding the interaction of the two. 6. 24 hour rehab nursing will assist with safety, skin/wound care, pain management and patient education  and help integrate therapy concepts, techniques,education, etc. 7. PT will assess and treat for/with: Lower  extremity strength, range of motion, stamina, balance, functional mobility, safety, adaptive techniques and equipment, woundcare, coping skills, pain control, education.   Goals are: Mod I. 8. OT will assess and treat for/with: ADL's, functional mobility, safety, upper extremity strength, adaptive techniques and equipment, wound mgt, ego support, and community reintegration.  Goals are: Mod I. Therapy may not proceed with showering this patient. 9. Case Management and Social Worker will assess and treat for psychological issues and discharge planning. 10. Team conference will be held weekly to assess progress toward goals and to determine barriers to discharge. 11. Patient will receive at least 3 hours of therapy per day at least 5 days per week. 12. ELOS: 7-10 days.       13. Prognosis:  excellent   Delice Lesch, MD, Mellody Drown 07/07/2016

## 2016-07-07 NOTE — Interval H&P Note (Signed)
[name] was admitted today to Inpatient Rehabilitation with the diagnosis of bilateral TKA.  The patient's history has been reviewed, patient examined, and there is no change in status.  Patient continues to be appropriate for intensive inpatient rehabilitation.  I have reviewed the patient's chart and labs.  Questions were answered to the patient's satisfaction. The PAPE has been reviewed and assessment remains appropriate.  [name] Lorie Phenix 07/07/2016, 1:22 PM

## 2016-07-07 NOTE — Progress Notes (Signed)
Physical Therapy Treatment Patient Details Name: [name] MRN: [id number] DOB: [birth year] Today's Date: 07/07/2016    History of Present Illness s/p bil TKA    PT Comments    Patient was seen in recliner upon arrival. Patient rated pain as a 3/10 prior to treatment. Performed sit to stand requiring minA and VC's for safe hand placement to push up from the chair before standing and reach back and flex at the trunk before sitting. Standing pivot transfer x minA with increased time required to perform transfer. Gait x 30 ft x minA with increased time to perform gait and maneuver turns. Patient displays decreased step length, stride length, and narrow base of support. Displays increased weight through UE using the walker during ambulation. Patient stated "she felt like her knee wasn't there anymore." Pain increased to a 6/10 during ambulation with multiple complaints of pain and needed to have rest breaks x 2. Performed AROM and AAROM LE exercises in the recliner. Patient requires assistance with short arc quads, straight leg raises, and knee flexion due to increase in pain. Patient is limited due to decreased activity tolerance, endurance, and pain. Patient received a blood transfusion of 2 units yesterday and is still recovering from that. Patient states "she is feeling better." Ice administered post-treatment to assist with decreasing pain and inflammation in B knees. Plans to D/C to Inpatient Rehab today.   Follow Up Recommendations  CIR;Supervision/Assistance - 24 hour     Equipment Recommendations  Rolling walker with 5" wheels    Recommendations for Other Services       Precautions / Restrictions Precautions Precautions: Knee;Fall Required Braces or Orthoses: Knee Immobilizer - Left Knee Immobilizer - Left: Discontinue once straight leg raise with < 10 degree lag Restrictions Weight Bearing Restrictions: No    Mobility  Bed Mobility                   Transfers Overall transfer level: Needs assistance Equipment used: Rolling walker (2 wheeled) Transfers: Sit to/from Stand Sit to Stand: Min assist Stand pivot transfers: Min assist       General transfer comment: Required minA for safe hand placement before rising and sitting onto various surfaces. Required minA to support BLE's.   Ambulation/Gait Ambulation/Gait assistance: Min assist Ambulation Distance (Feet): 30 Feet Assistive device: Rolling walker (2 wheeled) Gait Pattern/deviations: Step-to pattern;Decreased step length - left;Decreased step length - right;Decreased stride length Gait velocity: decreased Gait velocity interpretation: Below normal speed for age/gender General Gait Details: Pain increased to a 5/10 during ambulation. Required VC's for maneuvering safely with turns.  Increased time required for gait and turns.    Stairs            Wheelchair Mobility    Modified Rankin (Stroke Patients Only)       Balance                                    Cognition Arousal/Alertness: Awake/alert Behavior During Therapy: WFL for tasks assessed/performed Overall Cognitive Status: Within Functional Limits for tasks assessed                      Exercises Total Joint Exercises Ankle Circles/Pumps: AROM;Both;Supine;10 reps Quad Sets: AROM;Both;Supine;10 reps Towel Squeeze: AROM;Both;Supine;10 reps Short Arc Quad: Both;Supine;10 reps;AAROM Heel Slides: AAROM;Both;Supine;10 reps Hip ABduction/ADduction: Both;Supine;10 reps;AAROM Straight Leg Raises: AAROM;Both;Supine;10 reps    General Comments  Pertinent Vitals/Pain Pain Assessment: 0-10 Pain Score: 3  Pain Location: bilateral knees. R=L today. Pain Descriptors / Indicators: Discomfort Pain Intervention(s): Limited activity within patient's tolerance;Monitored during session;Ice applied;Repositioned    Home Living                      Prior Function             PT Goals (current goals can now be found in the care plan section) Progress towards PT goals: Progressing toward goals    Frequency    7X/week      PT Plan Current plan remains appropriate    Co-evaluation             End of Session Equipment Utilized During Treatment: Left knee immobilizer;Gait belt Activity Tolerance: Patient tolerated treatment well;Patient limited by pain Patient left: in chair;with call bell/phone within reach     Time:  - 9:45 - 10:25    Charges:     1 gt   1 te   1 ta                   G CodesHall Busing, SPTA [facility] [phone]  Present and agree with above  Rica Koyanagi  PTA [facility]  Acute  Rehab Pager      [phone]'

## 2016-07-07 NOTE — Progress Notes (Signed)
Rehab admissions - I spoke with attending.  Patient is medically ready for acute inpatient rehab admission for today.  Bed available and will admit to acute inpatient rehab today.  Call me for questions.  [phone]

## 2016-07-07 NOTE — Progress Notes (Addendum)
     Subjective: 4 Days Post-Op Procedure(s) (LRB): TOTAL KNEE BILATERAL ARTHROPLASTY (Bilateral) LEFT KNEE HARDWARE REMOVAL (Left)   Patient reports pain as mild, pain controlled. No events throughout the night. Feels much better after receiving 2 units of blood yesterday.  Discussed the post-op course with her doubles partner who has also been through bilateral TKAs.  Ready to be discharged to CIR.   Objective:   VITALS:   Vitals:   07/06/16 2150 07/07/16 0557  BP: (!) 118/53 (!) 106/54  Pulse: 77 77  Resp: 16 16  Temp: 97.9 F (36.6 C) 98.5 F (36.9 C)    Dorsiflexion/Plantar flexion intact Incision: dressing C/D/I No cellulitis present Compartment soft  LABS  Recent Labs  07/05/16 0416 07/06/16 0405 07/07/16 0425  HGB 8.2* 7.1* 9.7*  HCT 24.8* 21.3* 28.0*  WBC 9.1 6.5 6.2  PLT 155 146* 178     Recent Labs  07/05/16 0416 07/06/16 0405  NA 138 138  K 4.1 4.1  BUN 16 12  CREATININE 0.88 0.75  GLUCOSE 116* 103*     Assessment/Plan: 4 Days Post-Op Procedure(s) (LRB): TOTAL KNEE BILATERAL ARTHROPLASTY (Bilateral) LEFT KNEE HARDWARE REMOVAL (Left) Up with therapy Discharge to CIR Follow up in 2 weeks at [facility]. Follow up with OLIN,Akylah Hascall D in 2 weeks.  Contact information:  [facility] [address] Lakyla Biswas   PAC  07/07/2016, 7:45 AM

## 2016-07-07 NOTE — Progress Notes (Signed)
[name], MD Physician Signed Physical Medicine and Rehabilitation  PMR Pre-admission Date of Service: 07/05/2016 4:12 PM  Related encounter: Admission (Discharged) from 07/03/2016 in [place]       [] Hide copied text   [employer] PMR Admission Coordinator Pre-Admission Assessment  Patient: [name] is an 65 y.o., female MRN: [id number] DOB: [birth year] Height: 5' 7"  (170.2 cm) Weight: 68.9 kg (152 lb)  Insurance Information HMO:    PPO:      PCP:      IPA:      80/20:      OTHER:  PRIMARY:  BCBS [place]      Policy#:  [id number]      Subscriber:  spouse CM Name:  Merrilyn Puma      Phone#:  [phone]    Fax#:  [phone] Pre-Cert#:  [id number], approved for 07/06/16 - 07/20/16  With clinical updates due to Merrilyn Puma on 07/19/16    Employer:  Pt. self employed Benefits:  Phone #:  [phone]     Name:  online Eff. Date:  08/05/15     Deduct:  $5000 (met $5000)      Out of Pocket Max:  $7500 (met [phone])      Life Max:  n/a CIR:  60%/40%      SNF:  60%/40% Outpatient:  60%     Co-Pay: 40%   Home Health:  60%      Co-Pay: 40%  DME:  60%     Co-Pay: 40%  Providers:  In network  Emergency [place]    Name Branch Spouse [phone]      Current Medical History  Patient Admitting Diagnosis:  Bilateral knee osteoarthritis  History of Present Illness: A 65 year old right-handed female with unremarkable past medical history on no prescription medications. Per chart review patient lives with spouse independent prior to admission occasionally using a walker. Husband works during the day. Presented 06/30/2016 with bilateral knee pain secondary to osteoarthritis. Patient has failed nonsurgical conservative treatments including NSAIDs, corticosteroid injections and activity modification. No relief with  conservative care and underwent bilateral TKA 07/03/2016 per Dr. [name]. Weightbearing as tolerated bilateral lower extremities. Hospital course pain management. Placed on Xareltofor DVT prophylaxis. Acute blood loss anemia 7.1 transfused 2 units of packed red blood cells improved to 9.7. Physical and occupational therapy evaluations completed with recommendations of physical medicine rehabilitation consult. Patient to be admitted for comprehensive inpatient rehabilitation program.   Patient's medical record from [facility] has been reviewed by the rehabilitation admission coordinator and physician.  Past Medical History      Past Medical History:  Diagnosis Date  . Arthritis    osteoarthritis knees mostly    Family History   family history is not on file.  Prior Rehab/Hospitalizations Has the patient had major surgery during 100 days prior to admission? No             Current Medications  see MAR  Patients Current Diet:   Regular diet, thin liquids  Precautions / Restrictions Precautions Precautions: Knee, Fall Precaution Comments: used L KI, hypotension Restrictions Weight Bearing Restrictions: No   Has the patient had 2 or more falls or a fall with injury in the past year?Pt. Had one fall within the year when she jumped off a small wall, no significant injuries reported  Prior Activity Level  Community (5-7x/wk): Pt. describes herself as active and working full time.  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 [place] twice weekly (1 1/2 hours away)  Pt. plays tennis and golf regualrly and is an avid sports fan.  Prior Functional Level Self Care: Did the patient need help bathing, dressing, using the toilet or eating?  Independent  Indoor Mobility: Did the patient need assistance with walking from room to room (with or without device)? Independent  Stairs: Did the patient need assistance with internal or external stairs (with or  without device)? Independent  Functional Cognition: Did the patient need help planning regular tasks such as shopping or remembering to take medications? Independent  Home Assistive Devices / Equipment Home Assistive Devices/Equipment: Contact lenses, Eyeglasses, Environmental consultant (specify type) Home Equipment: Bedside commode, Walker - 2 wheels (AE)  Prior Device Use: Indicate devices/aids used by the patient prior to current illness, exacerbation or injury? None    Prior Functional Level Current Functional Level  Bed Mobility Independent Min assist  Transfers Independent Mod assist (mod assist +2)  Mobility - Walk/Wheelchair Independent Mod assist (20' +2 mod assist)  Upper Body Dressing Independent Other (set up)  Lower Body Dressing Independent Max assist (+2 max assist)  Grooming Independent Other (set  up)  Eating/Drinking Independent Independent  Toilet Transfer Independent Min assist (+2 min assist)  Bladder Continence  continent has foley catheter, to be removed 07/07/16  Bowel Management continent last BM 07/02/16    Stair Climbing  independent Other (not tested)  Communication WNLs WNLs  Memory WNLs WNLs  Cooking/Meal Prep independent     Housework independent   Money Management independent   Driving  independent     Special needs/care consideration BiPAP/CPAP  no CPM  no Continuous Drip IV  no Dialysis  no         Life Vest  no Oxygen  no Special Bed   no Trach Size   no Wound Vac (area)  no       Skin  Bilateral knee incisions with ace wrap dressings in place                               Bowel mgmt:   Last BM 07/02/16 Bladder mgmt:  Has foley catheter, to be removed 07/07/16 Diabetic mgmt n/a  Previous Home Environment Living Arrangements: Spouse/significant other Available Help at Discharge: Available 24 hours/day (pt. states husband can take time off from work as needed) Type of Home: House Home Layout: Multi-level, 1/2 bath on main level Alternate  Level Stairs-Number of Steps: flight Home Access: Stairs to enter Technical brewer of Steps: 1 Bathroom Shower/Tub: Multimedia programmer: Standard Bathroom Accessibility: Yes How Accessible: Accessible via walker [place]: No  Discharge Living Setting Plans for Discharge Living Setting: Patient's home Type of Home at Discharge: House Discharge Home Layout: Multi-level, 1/2 bath on main level Alternate Level Stairs-Number of Steps: flight Discharge Home Access: Stairs to enter Entrance Stairs-Number of Steps: 1 Discharge Bathroom Shower/Tub: Walk-in shower Discharge Bathroom Toilet: Handicapped height Discharge Bathroom Accessibility: Yes How Accessible: Accessible via walker Does the patient have any problems obtaining your medications?: No  Social/Family/Support Systems Patient Roles: Spouse, Parent (pt. has one daughter) Anticipated Caregiver: Marissa Weaver, husband Anticipated Caregiver's Contact Information: [phone] (cell) Ability/Limitations of Caregiver: husband works long hours per pt. but can take time off as needed Caregiver Availability: 24/7 Discharge Plan Discussed  with Primary Caregiver: No (pt.'s mentation WNLs) Does Caregiver/Family have Issues with Lodging/Transportation while Pt is in Rehab?: No  Goals/Additional Needs Patient/Family Goal for Rehab: modified independent and supervision PT/OT; n/a SLP Expected length of stay: 5-7 days Cultural Considerations: n/a Dietary Needs: regular diet, thin liquids Equipment Needs: TBA Additional Information: Pt. states she has numerous friends who have volunteered to stay with pt. if husband has to run out Pt/Family Agrees to Admission and willing to participate: Yes Program Orientation Provided & Reviewed with Pt/Caregiver Including Roles  & Responsibilities: Yes  Patient Condition: Pt. was admitted to [facility] on 07/03/16 with bilateral knee osteoarthritis and underwent  bilateral TKAs on same date.  PMH includes arthritis but is largely noncontributory.  Pt. Is undergoing acute PT and OT currently and will benefit from and can tolerate an inpatient rehab admission to maximize her functional recovery for discharge home.    Preadmission Screen Completed By:  Jamse Arn, 07/07/2016 9:40 AM ______________________________________________________________________   Discussed status with Dr. Posey Pronto on 07/07/16 at [phone] and received telephone approval for admission today.  Admission Coordinator:  [name], time 0916/Date 07/07/16   Assessment/Plan: Diagnosis: Bilateral knee osteoarthritis s/p b/l TKA  1. Does the need for close, 24 hr/day  Medical supervision in concert with the patient's rehab needs make it unreasonable for this patient to be served in a less intensive setting? Yes 2. Co-Morbidities requiring supervision/potential complications: pain management, Acute blood loss anemia 3. Due to bladder management, skin/wound care, disease management, pain management and patient education, does the patient require 24 hr/day rehab nursing? Yes 4. Does the patient require coordinated care of a physician, rehab nurse, PT (1-2 hrs/day, 5 days/week) and OT (1-2 hrs/day, 5 days/week) to address physical and functional deficits in the context of the above medical diagnosis(es)? Yes Addressing deficits in the following areas: balance, endurance, locomotion, strength, transferring, bathing, dressing, toileting and psychosocial support 5. Can the patient actively participate in an intensive therapy program of at least 3 hrs of therapy 5 days a week? Yes 6. The potential for patient to make measurable gains while on inpatient rehab is excellent 7. Anticipated functional outcomes upon discharge from inpatients are: modified independent PT, modified independent and supervision OT, n/a SLP 8. Estimated rehab length of stay to reach the above functional goals is: 8-12  days. 9. Does the patient have adequate social supports to accommodate these discharge functional goals? Yes 10. Anticipated D/C setting: Home 11. Anticipated post D/C treatments: HH therapy and Home excercise program 12. Overall Rehab/Functional Prognosis: excellent    RECOMMENDATIONS: This patient's condition is appropriate for continued rehabilitative care in the following setting: CIR Patient has agreed to participate in recommended program. Yes Note that insurance prior authorization may be required for reimbursement for recommended care.  [name] 07/07/2016     Revision History

## 2016-07-07 NOTE — H&P (Signed)
  Physical Medicine and Rehabilitation Admission H&P     HPI: 65-year-old right-handed female with unremarkable past medical history on no prescription medications. Per chart review patient lives with spouse independent prior to admission occasionally using a walker. Husband works during the day. Presented 06/30/2016 with bilateral knee pain secondary to osteoarthritis. Patient has failed nonsurgical conservative treatments including NSAIDs, corticosteroid injections and activity modification. No relief with conservative care and underwent bilateral TKA 07/03/2016 per Dr. Olin. Weightbearing as tolerated bilateral lower extremities. Hospital course pain management. Placed on Xarelto for DVT prophylaxis. Acute blood loss anemia 7.1 transfused 2 units of packed red blood cells improved to 9.7. Physical and occupational therapy evaluations completed with recommendations of physical medicine rehabilitation consult. Patient was admitted for comprehensive rehabilitation program  Review of Systems  Constitutional: Negative for chills and fever.  HENT: Negative for hearing loss.   Eyes: Negative for blurred vision and double vision.  Respiratory: Negative for cough and shortness of breath.   Cardiovascular: Negative for chest pain, palpitations and leg swelling.  Gastrointestinal: Positive for constipation. Negative for nausea and vomiting.  Genitourinary: Negative for dysuria and hematuria.  Musculoskeletal: Positive for joint pain.  Skin: Negative for rash.  Neurological: Negative for seizures, weakness and headaches.  All other systems reviewed and are negative.  Past Medical History:  Diagnosis Date  . Arthritis    osteoarthritis knees mostly   Past Surgical History:  Procedure Laterality Date  . DIAGNOSTIC LAPAROSCOPY     ovarian cystectomy/ BSO done.  . HARDWARE REMOVAL Left 07/03/2016   Procedure: LEFT KNEE HARDWARE REMOVAL;  Surgeon: Matthew Olin, MD;  Location: [facility];  Service:  Orthopedics;  Laterality: Left;  . KNEE SURGERY Bilateral    ACL x2, meniscectomy x3, open patella(bone from other knee)  . ROTATOR CUFF REPAIR Right   . TOTAL KNEE ARTHROPLASTY Bilateral 07/03/2016   Procedure: TOTAL KNEE BILATERAL ARTHROPLASTY;  Surgeon: Matthew Olin, MD;  Location: [facility];  Service: Orthopedics;  Laterality: Bilateral;   History reviewed. No pertinent family history. Social History:  reports that she has never smoked. She has never used smokeless tobacco. She reports that she drinks alcohol. She reports that she does not use drugs. Allergies:  Allergies  Allergen Reactions  . Penicillins Anaphylaxis and Swelling    Has patient had a PCN reaction causing immediate rash, facial/tongue/throat swelling, SOB or lightheadedness with hypotension:Yes Has patient had a PCN reaction causing severe rash involving mucus membranes or skin necrosis:No Has patient had a PCN reaction that required hospitalization:No Has patient had a PCN reaction occurring within the last 10 years:No If all of the above answers are "NO", then may proceed with Cephalosporin use.   . Tetracyclines & Related Hives   Medications Prior to Admission  Medication Sig Dispense Refill  . acetaminophen (TYLENOL) 500 MG tablet Take 2 tablets (1,000 mg total) by mouth every 8 (eight) hours. 30 tablet 0  . aspirin 81 MG chewable tablet Chew 1 tablet (81 mg total) by mouth 2 (two) times daily. Start the day after finishing the Xarelto.  Take for 4 weeks. 60 tablet 0  . docusate sodium (COLACE) 100 MG capsule Take 1 capsule (100 mg total) by mouth 2 (two) times daily. 10 capsule 0  . ferrous sulfate 325 (65 FE) MG tablet Take 1 tablet (325 mg total) by mouth 3 (three) times daily after meals.  3  . methocarbamol (ROBAXIN) 500 MG tablet Take 1 tablet (500 mg total) by mouth every 6 (six)   hours as needed for muscle spasms. 40 tablet 0  . oxyCODONE (OXY IR/ROXICODONE) 5 MG immediate release tablet Take 1-3 tablets (5-15  mg total) by mouth every 4 (four) hours as needed for severe pain. 90 tablet 0  . polyethylene glycol (MIRALAX / GLYCOLAX) packet Take 17 g by mouth 2 (two) times daily. 14 each 0  . rivaroxaban (XARELTO) 10 MG TABS tablet Take 1 tablet (10 mg total) by mouth daily. 14 tablet 0    Home: Home Living Family/patient expects to be discharged to:: Private residence Living Arrangements: Spouse/significant other Available Help at Discharge: Available 24 hours/day (pt. states husband can take time off from work as needed) Type of Home: House Home Access: Stairs to enter Technical brewer of Steps: 1 Home Layout: Multi-level, 1/2 bath on main level Alternate Level Stairs-Number of Steps: flight Bathroom Shower/Tub: Multimedia programmer: Standard Bathroom Accessibility: Yes Home Equipment: Bedside commode, Environmental consultant - 2 wheels (AE)   Functional History: Prior Function Level of Independence: Independent Comments: husband works during the day  Functional Status:  Mobility: Bed Mobility Overal bed mobility: Needs Assistance Bed Mobility: Supine to Sit Supine to sit: Min assist Sit to supine: Min assist, HOB elevated General bed mobility comments: self assisting legs, extra time Transfers Overall transfer level: Needs assistance Equipment used: Rolling walker (2 wheeled) Transfers: Sit to/from Stand Sit to Stand: Mod assist, From elevated surface, +2 safety/equipment Stand pivot transfers: +2 safety/equipment General transfer comment: bed raised to facilitate standing . Assist to stand, staeady assist to  stabilize to get trunk more upright. Ambulation/Gait Ambulation/Gait assistance: Mod assist, +2 safety/equipment Ambulation Distance (Feet): 20 Feet Assistive device: Rolling walker (2 wheeled) Gait Pattern/deviations: Step-to pattern, Trunk flexed General Gait Details: cues for sequence,   c/o dizziness,  Cues for position inside the RW.    ADL: ADL Overall ADL's :  Needs assistance/impaired Grooming: Set up, Sitting Upper Body Bathing: Set up, Sitting Lower Body Bathing: Moderate assistance, +2 for safety/equipment, Sit to/from stand Upper Body Dressing : Set up, Sitting Lower Body Dressing: Maximal assistance, +2 for safety/equipment, Sit to/from stand Toilet Transfer: Minimal assistance, +2 for safety/equipment, Stand-pivot, BSC, RW (couple of steps to chair) Toileting- Clothing Manipulation and Hygiene: Minimal assistance, +2 for safety/equipment, Sit to/from stand General ADL Comments: pt sat EOB.  See orthostatic BPs around 10 am.  Pt had soft BPs and UE exercise EOB did not raise it significantly.  Returned to supine. Pt used leg lifter to get OOB; min A required for bed mobility  Cognition: Cognition Overall Cognitive Status: Within Functional Limits for tasks assessed Orientation Level: Oriented X4 Cognition Arousal/Alertness: Awake/alert Behavior During Therapy: WFL for tasks assessed/performed Overall Cognitive Status: Within Functional Limits for tasks assessed  Physical Exam: Blood pressure 108/63, pulse 80, temperature 97.3 F (36.3 C), temperature source Axillary, resp. rate 16, height 5' 7" (1.702 m), weight 68.9 kg (152 lb), SpO2 98 %. Physical Exam  Vitals reviewed. Constitutional: She is oriented to person, place, and time. She appears well-developed and well-nourished.  Alert female with complaints of mild nausea.  HENT:  Head: Normocephalic and atraumatic.  Eyes: Conjunctivae and EOM are normal.  Neck: Normal range of motion. Neck supple. No thyromegaly present.  Cardiovascular: Normal rate and regular rhythm.   Respiratory: Effort normal and breath sounds normal. No respiratory distress.  GI: Soft. Bowel sounds are normal. She exhibits no distension.  Musculoskeletal: She exhibits edema and tenderness.  Neurological: She is alert and oriented to person, place, and  time.  Motor: B/l UE 5/5 B/l LE: HF 2/5, ankle  dorsi/plantar flexion 5/5 Sensation intact  Skin: Skin is warm and dry.  Bilateral knee incisions are dressed appropriately tender.  Psychiatric: She has a normal mood and affect. Her behavior is normal. Thought content normal.    Results for orders placed or performed during the hospital encounter of 07/03/16 (from the past 48 hour(s))  CBC     Status: Abnormal   Collection Time: 07/06/16  4:05 AM  Result Value Ref Range   WBC 6.5 4.0 - 10.5 K/uL   RBC 2.29 (L) 3.87 - 5.11 MIL/uL   Hemoglobin 7.1 (L) 12.0 - 15.0 g/dL   HCT 21.3 (L) 36.0 - 46.0 %   MCV 93.0 78.0 - 100.0 fL   MCH 31.0 26.0 - 34.0 pg   MCHC 33.3 30.0 - 36.0 g/dL   RDW 13.0 11.5 - 15.5 %   Platelets 146 (L) 150 - 400 K/uL  Basic metabolic panel     Status: Abnormal   Collection Time: 07/06/16  4:05 AM  Result Value Ref Range   Sodium 138 135 - 145 mmol/L   Potassium 4.1 3.5 - 5.1 mmol/L   Chloride 107 101 - 111 mmol/L   CO2 28 22 - 32 mmol/L   Glucose, Bld 103 (H) 65 - 99 mg/dL   BUN 12 6 - 20 mg/dL   Creatinine, Ser 0.75 0.44 - 1.00 mg/dL   Calcium 7.9 (L) 8.9 - 10.3 mg/dL   GFR calc non Af Amer >60 >60 mL/min   GFR calc Af Amer >60 >60 mL/min    Comment: (NOTE) The eGFR has been calculated using the CKD EPI equation. This calculation has not been validated in all clinical situations. eGFR's persistently <60 mL/min signify possible Chronic Kidney Disease.    Anion gap 3 (L) 5 - 15  Prepare RBC     Status: None   Collection Time: 07/06/16  8:05 AM  Result Value Ref Range   Order Confirmation ORDER PROCESSED BY BLOOD BANK   CBC     Status: Abnormal   Collection Time: 07/07/16  4:25 AM  Result Value Ref Range   WBC 6.2 4.0 - 10.5 K/uL   RBC 3.11 (L) 3.87 - 5.11 MIL/uL   Hemoglobin 9.7 (L) 12.0 - 15.0 g/dL    Comment: DELTA CHECK NOTED POST TRANSFUSION SPECIMEN    HCT 28.0 (L) 36.0 - 46.0 %   MCV 90.0 78.0 - 100.0 fL   MCH 31.2 26.0 - 34.0 pg   MCHC 34.6 30.0 - 36.0 g/dL   RDW 13.5 11.5 - 15.5 %    Platelets 178 150 - 400 K/uL   No results found.     Medical Problem List and Plan: 1.  Decreased functional mobility secondary to bilateral TKA 07/03/2016. Weightbearing as tolerated 2.  DVT Prophylaxis/Anticoagulation: Xarelto. Check vascular studies 3. Pain Management: Celebrex 200 mg twice a day. Oxycodone and Robaxin as needed 4. Acute blood loss anemia. Continue iron supplement. Follow-up CBC 5. Neuropsych: This patient is capable of making decisions on her own behalf. 6. Skin/Wound Care: Routine skin checks 7. Fluids/Electrolytes/Nutrition: Routine I&O with follow-up chemistries 8. Constipation. Laxative assistance   Post Admission Physician Evaluation: 1. Functional deficits secondary  to B/l TKA. 2. Patient is admitted to receive collaborative, interdisciplinary care between the physiatrist, rehab nursing staff, and therapy team. 3. Patient's level of medical complexity and substantial therapy needs in context of that medical necessity cannot be provided at   a lesser intensity of care such as a SNF. 4. Patient has experienced substantial functional loss from his/her baseline which was documented above under the "Functional History" and "Functional Status" headings.  Judging by the patient's diagnosis, physical exam, and functional history, the patient has potential for functional progress which will result in measurable gains while on inpatient rehab.  These gains will be of substantial and practical use upon discharge  in facilitating mobility and self-care at the household level. 5. Physiatrist will provide 24 hour management of medical needs as well as oversight of the therapy plan/treatment and provide guidance as appropriate regarding the interaction of the two. 6. 24 hour rehab nursing will assist with safety, skin/wound care, pain management and patient education  and help integrate therapy concepts, techniques,education, etc. 7. PT will assess and treat for/with: Lower  extremity strength, range of motion, stamina, balance, functional mobility, safety, adaptive techniques and equipment, woundcare, coping skills, pain control, education.   Goals are: Mod I. 8. OT will assess and treat for/with: ADL's, functional mobility, safety, upper extremity strength, adaptive techniques and equipment, wound mgt, ego support, and community reintegration.  Goals are: Mod I. Therapy may not proceed with showering this patient. 9. Case Management and Social Worker will assess and treat for psychological issues and discharge planning. 10. Team conference will be held weekly to assess progress toward goals and to determine barriers to discharge. 11. Patient will receive at least 3 hours of therapy per day at least 5 days per week. 12. ELOS: 7-10 days.       13. Prognosis:  excellent   Delice Lesch, MD, Mellody Drown 07/07/2016

## 2016-07-07 NOTE — Discharge Summary (Signed)
Physician Discharge Summary  Patient ID: [name] MRN: [id number] DOB/AGE: 64-Dec-1952 64 y.o.  Admit date: 07/03/2016 Discharge date:  07/07/2016  Procedures:  Procedure(s) (LRB): TOTAL KNEE BILATERAL ARTHROPLASTY (Bilateral) LEFT KNEE HARDWARE REMOVAL (Left)  Attending Physician:  Dr. Paralee Guerra   Admission Diagnoses:   Bilateral knee primary OA / pain  Discharge Diagnoses:  Principal Problem:   S/P bilateral TKAs Active Problems:   Acute blood loss anemia  Past Medical History:  Diagnosis Date  . Arthritis    osteoarthritis knees mostly    HPI:    [name], 65 y.o. female, has a history of pain and functional disability in the bilateral knees due to trauma and arthritis and has failed non-surgical conservative treatments for greater than 12 weeks to include NSAID's and/or analgesics, corticosteriod injections and activity modification.  Onset of symptoms was gradual, starting 30+ years ago with gradually worsening course since that time. The patient noted prior procedures on the knee to include  arthroscopy, menisectomy, ACL reconstruction and multiple others on the bilateral knees.  Patient currently rates pain in the bilateral knee(s) at 7 out of 10 with activity. Patient has night pain, worsening of pain with activity and weight bearing, pain that interferes with activities of daily living, pain with passive range of motion, crepitus and joint swelling.  Patient has evidence of periarticular osteophytes and joint space narrowing by imaging studies.  There is no active infection.  Risks, benefits and expectations were discussed with the patient.  Risks including but not limited to the risk of anesthesia, blood clots, nerve damage, blood vessel damage, failure of the prosthesis, infection and up to and including death.  Patient understand the risks, benefits and expectations and wishes to proceed with surgery.   PCP: Amanda Gravel, MD   Discharged Condition:  good  Hospital Course:  Patient underwent the above stated procedure on 07/03/2016. Patient tolerated the procedure well and brought to the recovery room in good condition and subsequently to the floor.  POD #1  BP: 90/50 ; Pulse: 71 ; Temp: 98.3 F (36.8 C) ; Resp: 16 Patient reports pain as moderate, pain controlled.  No events throughout the night.  Some nausea started this morning when she first leaned up in the bed.  Ready to start working with therapy and starting on the road to recovery.   Dorsiflexion/plantar flexion intact, incision: dressing C/D/I, no cellulitis present and compartment soft.   LABS  Basename    HGB     10.6  HCT     31.7   POD #2 BP: 96/53 ; Pulse: 77 ; Temp: 98.5 F (36.9 C) ; Resp: 18 Patient reports pain as moderate, pain controlled. Feels more pain, but less dizziness since taking less pain medication.  No events throughout the night.  Requested a CIR consult. Dorsiflexion/plantar flexion intact, incision: dressing C/D/I, no cellulitis present and compartment soft.   LABS  Basename    HGB     8.2  HCT     24.8   POD #3  BP: 106/54 ; Pulse: 84 ; Temp: 99.2 F (37.3 C) ; Resp: 16 Patient reports pain as moderate, pain not fully controlled. Patient is trying to use less medication do some of the symptoms she is having, including dizziness and feeling light headed. Discussion was had with regards to transfusing 2 units of blood.  After discussing her symptoms, hypotension and labs she is will to get blood.  Discussed going to CIR, this will  probably happen tomorrow, if stable. Dorsiflexion/plantar flexion intact, incision: dressing C/D/I, no cellulitis present and compartment soft.   LABS  Basename    HGB     7.1  HCT     21.3   POD #4  BP: 106/54 ; Pulse: 77 ; Temp: 98.5 F (3.9 C) ; Resp: 16 Patient reports pain as mild, pain controlled. No events throughout the night. Feels much better after receiving 2 units of blood yesterday.  Discussed the  post-op course with her doubles partner who has also been through bilateral TKAs.  Ready to be discharged to CIR.   Dorsiflexion/plantar flexion intact, incision: dressing C/D/I, no cellulitis present and compartment soft.   LABS  Basename    HGB     9.7  HCT     28.0     Discharge Exam: General appearance: alert, cooperative and no distress Extremities: Homans sign is negative, no sign of DVT, no edema, redness or tenderness in the calves or thighs and no ulcers, gangrene or trophic changes  Disposition: [facility] with follow up in 2 weeks   Follow-up Information    Amanda Pole, MD. Schedule an appointment as soon as possible for a visit in 2 week(s).   Specialty:  Orthopedic Surgery Contact information: [address] [place] [address] [phone]        [facility] .   Specialty:  Home Health Services Why:  home health physical therapy Contact information: [name] [place] [address] [phone]           Discharge Instructions    Call MD / Call 911    Complete by:  As directed    If you experience chest pain or shortness of breath, CALL 911 and be transported to the hospital emergency room.  If you develope a fever above 101 F, pus (white drainage) or increased drainage or redness at the wound, or calf pain, call your surgeon's office.   Change dressing    Complete by:  As directed    Maintain surgical dressing until follow up in the clinic. If the edges start to pull up, may reinforce with tape. If the dressing is no longer working, may remove and cover with gauze and tape, but must keep the area dry and clean.  Call with any questions or concerns.   Constipation Prevention    Complete by:  As directed    Drink plenty of fluids.  Prune juice may be helpful.  You may use a stool softener, such as Colace (over the counter) 100 mg twice a day.  Use MiraLax (over the counter) for constipation as needed.    Diet - low sodium heart healthy    Complete by:  As directed    Discharge instructions    Complete by:  As directed    Maintain surgical dressing until follow up in the clinic. If the edges start to pull up, may reinforce with tape. If the dressing is no longer working, may remove and cover with gauze and tape, but must keep the area dry and clean.  Follow up in 2 weeks at [facility]. Call with any questions or concerns.   Increase activity slowly as tolerated    Complete by:  As directed    Weight bearing as tolerated with assist device (walker, cane, etc) as directed, use it as long as suggested by your surgeon or therapist, typically at least 4-6 weeks.   TED hose  Complete by:  As directed    Use stockings (TED hose) for 2 weeks on both leg(s).  You may remove them at night for sleeping.        Medication List    STOP taking these medications   ibuprofen 200 MG tablet Commonly known as:  ADVIL,MOTRIN     TAKE these medications   acetaminophen 500 MG tablet Commonly known as:  TYLENOL Take 2 tablets (1,000 mg total) by mouth every 8 (eight) hours.   aspirin 81 MG chewable tablet Chew 1 tablet (81 mg total) by mouth 2 (two) times daily. Start the day after finishing the Xarelto.  Take for 4 weeks.   docusate sodium 100 MG capsule Commonly known as:  COLACE Take 1 capsule (100 mg total) by mouth 2 (two) times daily.   ferrous sulfate 325 (65 FE) MG tablet Take 1 tablet (325 mg total) by mouth 3 (three) times daily after meals.   methocarbamol 500 MG tablet Commonly known as:  ROBAXIN Take 1 tablet (500 mg total) by mouth every 6 (six) hours as needed for muscle spasms.   oxyCODONE 5 MG immediate release tablet Commonly known as:  Oxy IR/ROXICODONE Take 1-3 tablets (5-15 mg total) by mouth every 4 (four) hours as needed for severe pain.   polyethylene glycol packet Commonly known as:  MIRALAX / GLYCOLAX Take 17 g by mouth 2 (two) times daily.    rivaroxaban 10 MG Tabs tablet Commonly known as:  XARELTO Take 1 tablet (10 mg total) by mouth daily.        Signed: West Pugh. Lamerle Jabs   PA-C  07/07/2016, 7:59 AM

## 2016-07-08 ENCOUNTER — Inpatient Hospital Stay (HOSPITAL_COMMUNITY): Payer: BLUE CROSS/BLUE SHIELD | Admitting: Occupational Therapy

## 2016-07-08 ENCOUNTER — Inpatient Hospital Stay (HOSPITAL_COMMUNITY): Payer: BLUE CROSS/BLUE SHIELD | Admitting: Physical Therapy

## 2016-07-08 NOTE — Progress Notes (Signed)
Patient ID: [name], female   DOB: [birth year], 65 y.o.   MRN: [id number]   07/08/16.   65 year old patient admitted for CIR due to functional deficits following bilateral total knee replacement.  Patient is status post TKA on 07/03/2016 per Dr. [name].  The patient has received Xarelto for DVT prophylaxis.  The patient required 2 units of packed RBCs Postop.  Admitted 07/07/2016 and had a reasonably comfortable night.  Only complaint is some drainage from the left ear  Past Medical History:  Diagnosis Date  . Arthritis    osteoarthritis knees mostly   Review of Systems  Constitutional: Negative for chills and fever.  HENT: Negative for hearing loss.   Eyes: Negative for blurred vision and double vision.  Respiratory: Negative for cough and shortness of breath.   Cardiovascular: Negative for chest pain, palpitations and leg swelling.  Gastrointestinal: Positive for constipation. Negative for nausea and vomiting.  Genitourinary: Negative for dysuria and hematuria.  Musculoskeletal: Positive for joint pain.  Skin: Negative for rash.  Neurological: Negative for seizures, weakness and headaches.  All other systems reviewed and are negative.   Physical Exam  Vitals reviewed. Constitutional: She is oriented to person, place, and time. She appears well-developed and well-nourished.  Alert female with complaints of Drainage from the left ear HENT:  Minimal wax in the left canal with some serosanguineous drainage.  Tympanic membrane not well visualized.  Mild erythema of the canal Head: Normocephalic and atraumatic.  Eyes: Conjunctivae and EOM are normal.  Neck: Normal range of motion. Neck supple. No thyromegaly present.  Cardiovascular: Normal rate and regular rhythm.   Respiratory: Effort normal and breath sounds normal. No respiratory distress.  GI: Soft. Bowel sounds are normal. She exhibits no distension.  Musculoskeletal: She exhibits edema and tenderness.  Neurological: She  is alert and oriented to person, place, and time.   Skin: Skin is warm and dry.  Bilateral knee incisions are dressed appropriately tender.    Medical Problem List and Plan: 1.  Decreased functional mobility secondary to bilateral TKA 07/03/2016. Weightbearing as tolerated 2.  DVT Prophylaxis/Anticoagulation: Xarelto. Check vascular studies 3. Pain Management: Celebrex 200 mg twice a day. Oxycodone and Robaxin as needed 4. Acute blood loss anemia. Continue iron supplement. Follow-up CBC 5.  Otitis externa left ear.  Will treat with  Cortisporin otic drops 6. Skin/Wound Care: Routine skin checks 7. Fluids/Electrolytes/Nutrition: Routine I&O with follow-up chemistries 8. Constipation. Laxative assistance   [name]

## 2016-07-08 NOTE — Evaluation (Addendum)
Physical Therapy Assessment and Plan  Patient Details  Name: [name] MRN: [id number] Date of Birth: [birth year]  PT Diagnosis: Difficulty walking, Muscle weakness and Pain in joint Rehab Potential: Excellent ELOS: 7-10 days    Today's Date: 07/08/2016 PT Individual Time: 1000-1100 AND 2800-3491 PT Individual Time Calculation (min): 60 min AND 60 min     Problem List:  Patient Active Problem List   Diagnosis Date Noted  . 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 Constipation due to pain medication   . Post-operative pain   . Abnormality of gait   . Acute blood loss anemia 07/06/2016  . S/P bilateral TKAs 07/03/2016    Past Medical History:  Past Medical History:  Diagnosis Date  . Arthritis    osteoarthritis knees mostly   Past Surgical History:  Past Surgical History:  Procedure Laterality Date  . DIAGNOSTIC LAPAROSCOPY     ovarian cystectomy/ BSO done.  Marland Kitchen HARDWARE REMOVAL Left 07/03/2016   Procedure: LEFT KNEE HARDWARE REMOVAL;  Surgeon: Paralee Cancel, MD;  Location: [facility];  Service: Orthopedics;  Laterality: Left;  . KNEE SURGERY Bilateral    ACL x2, meniscectomy x3, open patella(bone from other knee)  . ROTATOR CUFF REPAIR Right   . TOTAL KNEE ARTHROPLASTY Bilateral 07/03/2016   Procedure: TOTAL KNEE BILATERAL ARTHROPLASTY;  Surgeon: Paralee Cancel, MD;  Location: [facility];  Service: Orthopedics;  Laterality: Bilateral;    Assessment & Plan Clinical Impression: Patient is a 65 year old right-handed female with unremarkable past medical history on no prescription medications. Per chart review patient lives with spouse independent prior to admission occasionally using a walker. Husband works during the day. Presented 06/30/2016 with bilateral knee pain secondary to osteoarthritis. Patient has failed nonsurgical conservative treatments including NSAIDs, corticosteroid injections and activity modification. No relief with conservative care and underwent  bilateral TKA 07/03/2016 per Dr. [name]. Weightbearing as tolerated bilateral lower extremities. Hospital course pain management. Placed on Xarelto for DVT prophylaxis. Acute blood loss anemia 7.1 transfused 2 units of packed red blood cells improved to 9.7.   Patient transferred to CIR on 07/07/2016 .   Patient currently requires min with mobility secondary to muscle weakness and decreased standing balance and decreased balance strategies.  Prior to hospitalization, patient was independent  with mobility and lived with Spouse in a House home.  Home access is 1Stairs to enter.  Patient will benefit from skilled PT intervention to maximize safe functional mobility, minimize fall risk and decrease caregiver burden for planned discharge home with intermittent assist.  Anticipate patient will benefit from follow up OP at discharge.   PT - End of Session Activity Tolerance: Tolerates 30+ min activity without fatigue Endurance Deficit: Yes PT Assessment Rehab Potential (ACUTE/IP ONLY): Excellent Barriers to Discharge: Decreased caregiver support PT Patient demonstrates impairments in the following area(s): Balance;Pain;Safety;Skin Integrity PT Transfers Functional Problem(s): Bed Mobility;Bed to Chair;Car;Furniture;Floor PT Locomotion Functional Problem(s): Ambulation;Wheelchair Mobility;Stairs PT Plan PT Intensity: Minimum of 1-2 x/day ,45 to 90 minutes PT Frequency: 5 out of 7 days PT Duration Estimated Length of Stay: 7-10 days  PT Treatment/Interventions: Ambulation/gait training;Balance/vestibular training;Community reintegration;Discharge planning;Disease management/prevention;DME/adaptive equipment instruction;Functional mobility training;Neuromuscular re-education;Pain management;Patient/family education;Skin care/wound management;Stair training;Therapeutic Activities;Therapeutic Exercise;UE/LE Strength taining/ROM;UE/LE Coordination activities;Visual/perceptual remediation/compensation;Wheelchair  propulsion/positioning PT Transfers Anticipated Outcome(s): Mod I with LRAD  PT Locomotion Anticipated Outcome(s): Mod I with LRAD  PT Recommendation Follow Up Recommendations: Outpatient PT Patient destination: Home Equipment Recommended: To be determined Equipment Details: Patient has RW     Skilled Therapeutic Intervention  Pt received sititng in recliner and agreeable to PT.  PT instructed patient in PT evaluation and initiated treatment; see below for results. PT instructed patient in transfers including car, bed, sit<>stand and stand pivot with min assist- steady assist with RW as listed below. Gait training as listed below with RW and steady assist from PT. Patient instructed in stair management with min Assist. Education for length of stay, rehab goals, plan of care and discharge plan. Patient left sitting in recliner with Call bell in reach.   Session 2:  Patient received sitting in Recliner and agreeable to PT. Throughout treatment, patient instructed in sit<>stand transfers from various heights with min-supervisision Assist with min cues to allow increased knee flexion as tolerated. Gait training in hall 154f x 2 with supervision assist from PT with min-mod cues for posture, weigh shifting, increased knee flexion, and step through gait pattern. PT instructed patient in sitting therex including knee flexion/extension on towel, AAROM knee flexion with over pressure form contralateral LE, alternating marches. Supine therex. SLR x 10 BLE, SAQ x 10 BLE heel slides x 12 BLE,   Patient performed bed mobility with supervision Assist prior to and following therex.   Patient left sitting in recliner following therapy with call bel lin reach and all needs met.   PT Evaluation Precautions/Restrictions Precautions Precautions: Knee;Fall Restrictions RLE Weight Bearing: Weight bearing as tolerated LLE Weight Bearing: Weight bearing as tolerated General   Vital Signs  Pain   5/10  Bilateral knee; stabbing.  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  Coordination Gross Motor Movements are Fluid and Coordinated: Yes Fine Motor Movements are Fluid and Coordinated: Yes Motor  Motor Motor: Other (comment) Motor - Skilled Clinical Observations: generalized weakness, decr ROM and acute pain  Mobility Bed Mobility Bed Mobility: Rolling Right;Rolling Left;Supine to Sit;Sit to Supine Rolling Right: 5: Supervision Rolling Right Details: Verbal cues for precautions/safety Rolling Left: 5: Supervision Rolling Left Details: Verbal cues for precautions/safety Supine to Sit: 5: Supervision Supine to Sit Details (indicate cue type and reason): through long sitting.  Sit to Supine: 5: Supervision Sit to Supine - Details (indicate cue type and reason): through  long sitting Transfers Transfers: Yes Sit to Stand: 4: Min guard (with RW) Stand to Sit: 4: Min guard Locomotion  Ambulation Ambulation: Yes Ambulation/Gait Assistance: 4: Min guard Ambulation Distance (Feet): 180 Feet Assistive device: Rolling walker Gait Gait: Yes Gait Pattern: Step-to pattern;Decreased step length - right;Decreased step length - left Stairs / Additional Locomotion Stairs: Yes Stairs Assistance: 4: Min assist Stair Management Technique: Two rails Number of Stairs: 8 Height of Stairs: 3 Wheelchair Mobility Wheelchair Mobility: Yes Wheelchair Assistance:  5: Supervision Wheelchair Assistance Details: Verbal cues for Information systems manager: Both upper extremities Wheelchair Parts Management: Needs assistance Distance: 137f  Trunk/Postural Assessment  Cervical Assessment Cervical Assessment: Within Functional Limits Thoracic Assessment Thoracic Assessment: Within Functional Limits Lumbar Assessment Lumbar Assessment: Within Functional Limits Postural Control Postural Control: Deficits on evaluation (delayed balance reactions)  Balance Balance Balance Assessed: Yes Dynamic Sitting Balance Dynamic Sitting - Balance Support: During functional activity Dynamic Sitting - Level of Assistance: 5: Stand by assistance Static Standing Balance Static Standing - Balance Support: During functional activity Static Standing - Level of Assistance: 4: Min assist Dynamic Standing Balance Dynamic Standing - Balance Support: During functional activity Dynamic Standing - Level of Assistance: 4: Min assist Extremity Assessment      RLE Assessment RLE Assessment: Exceptions to [facility] AROM (degrees) Right Knee Extension: 9 Right Knee Flexion: 75 RLE Strength RLE Overall Strength Comments: grossly 4/5 proximal to distal   LLE Assessment LLE Assessment: Exceptions to WFL LLE AROM (degrees) Left Knee Extension: 5 Left Knee Flexion: 71 LLE  Strength LLE Overall Strength Comments: grossly 4/5 proxmial to distal   See Function Navigator for Current Functional Status.   Refer to Care Plan for Long Term Goals  Recommendations for other services: None  Discharge Criteria: Patient will be discharged from PT if patient refuses treatment 3 consecutive times without medical reason, if treatment goals not met, if there is a change in medical status, if patient makes no progress towards goals or if patient is discharged from hospital.  The above assessment, treatment plan, treatment alternatives and goals were discussed and mutually agreed upon: by patient  ALorie Phenix11/12/2015, 2:58 PM

## 2016-07-08 NOTE — Evaluation (Signed)
Occupational Therapy Assessment and Plan  Patient Details  Name: [name] MRN: [id number] Date of Birth: 17-Dec-1950  OT Diagnosis: acute pain and muscle weakness (generalized) Rehab Potential: Rehab Potential (ACUTE ONLY): Good ELOS: 7-10 days   Today's Date: 07/08/2016 OT Individual Time: 0800-0900 OT Individual Time Calculation (min): 60 min      Problem List: Patient Active Problem List   Diagnosis Date Noted  . 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 Constipation due to pain medication   . Post-operative pain   . Abnormality of gait   . Acute blood loss anemia 07/06/2016  . S/P bilateral TKAs 07/03/2016    Past Medical History:  Past Medical History:  Diagnosis Date  . Arthritis    osteoarthritis knees mostly   Past Surgical History:  Past Surgical History:  Procedure Laterality Date  . DIAGNOSTIC LAPAROSCOPY     ovarian cystectomy/ BSO done.  Marland Kitchen HARDWARE REMOVAL Left 07/03/2016   Procedure: LEFT KNEE HARDWARE REMOVAL;  Surgeon: Paralee Cancel, MD;  Location: [facility];  Service: Orthopedics;  Laterality: Left;  . KNEE SURGERY Bilateral    ACL x2, meniscectomy x3, open patella(bone from other knee)  . ROTATOR CUFF REPAIR Right   . TOTAL KNEE ARTHROPLASTY Bilateral 07/03/2016   Procedure: TOTAL KNEE BILATERAL ARTHROPLASTY;  Surgeon: Paralee Cancel, MD;  Location: [facility];  Service: Orthopedics;  Laterality: Bilateral;    Assessment & Plan Clinical Impression: Patient is a 65 y.o. year old female right-handed female with unremarkable past medical history on no prescription medications. Per chart review patient lives with spouse independent prior to admission occasionally using a walker. Husband works during the day. Presented 06/30/2016 with bilateral knee pain secondary to osteoarthritis. Patient has failed nonsurgical conservative treatments including NSAIDs, corticosteroid injections and activity modification. No relief with conservative care and underwent  bilateral TKA 07/03/2016 per Dr. [name]. Weightbearing as tolerated bilateral lower extremities. Hospital course pain management. Placed on Xarelto for DVT prophylaxis. Acute blood loss anemia 7.1 transfused 2 units of packed red blood cells improved to 9.7.  Patient transferred to CIR on 07/07/2016 .    Patient currently requires max A for LB bathing and dressing and min A for transfers and UB bathing and dressing with basic self-care skills secondary to muscle weakness, decreased cardiorespiratoy endurance and decreased standing balance, decreased balance strategies and difficulty maintaining precautions.  Prior to hospitalization, patient could complete ADL with modified independent .  Patient will benefit from skilled intervention to decrease level of assist with basic self-care skills and increase independence with basic self-care skills prior to discharge home with care partner.  Anticipate patient will require intermittent supervision and follow up home health.  OT - End of Session Activity Tolerance: Tolerates 10 - 20 min activity with multiple rests OT Assessment Rehab Potential (ACUTE ONLY): Good OT Patient demonstrates impairments in the following area(s): Balance;Safety;Edema;Endurance;Motor;Pain;Skin Integrity OT Basic ADL's Functional Problem(s): Grooming;Bathing;Dressing;Toileting OT Transfers Functional Problem(s): Toilet;Tub/Shower OT Additional Impairment(s): None OT Plan OT Intensity: Minimum of 1-2 x/day, 45 to 90 minutes OT Frequency: 5 out of 7 days OT Duration/Estimated Length of Stay: 7-10 days OT Treatment/Interventions: Balance/vestibular training;Discharge planning;Pain management;Self Care/advanced ADL retraining;Therapeutic Activities;UE/LE Coordination activities;Therapeutic Exercise;Skin care/wound managment;Patient/family education;Functional mobility training;Community reintegration;DME/adaptive equipment instruction;Psychosocial support;Splinting/orthotics;Wheelchair  propulsion/positioning;UE/LE Strength taining/ROM OT Self Feeding Anticipated Outcome(s): n/a OT Basic Self-Care Anticipated Outcome(s): mod I  OT Toileting Anticipated Outcome(s): mod I  OT Bathroom Transfers Anticipated Outcome(s): mod I  OT Recommendation Patient destination: Home Follow Up Recommendations: None Equipment  Recommended: To be determined   Skilled Therapeutic Intervention 1:1 OT eval initiated with OT goals, purpose and role discussed with pt. Self care retraining at shower level with bilateral knees covered. Focus on sit to stand, standing balance, activity tolerance, etc. Pt performed sit to stands with min A and functional ambulation with min a. Pt with increased pain with knee flexion and difficulty sitting with feet bend on the floor. Pt required lots of rest breaks. Pt reports feeling weak and just tired all the time and is eager to get home. Pt reports already having a hospital bed at home due to her flight of stair- however maybe could master them by the time she leaves CIR. Perform heal slides on floor with maxi slides to decr friction for increased ROM in knees and to decr stiffness to more easily access LE during ADL tasks. Left in recliner to rest inbetween therapy.   OT Evaluation Precautions/Restrictions  Precautions Precautions: Knee;Fall Restrictions Weight Bearing Restrictions: Yes RLE Weight Bearing: Weight bearing as tolerated LLE Weight Bearing: Weight bearing as tolerated General Chart Reviewed: Yes Family/Caregiver Present: No Vital Signs Therapy Vitals Temp: 98.6 F (37 C) Temp Source: Oral Pulse Rate: 80 Resp: 20 BP: 113/64 Patient Position (if appropriate): Lying Oxygen Therapy SpO2: 98 % O2 Device: Not Delivered Pain Pain Assessment Pain Score: 7  Pain Location: Knee Pain Orientation: Right Pain Onset: On-going Pain Intervention(s): RN made aware;Shower;Rest Multiple Pain Sites: Yes 2nd Pain Site Pain Score: 7 Pain Location:  Knee Pain Orientation: Left Pain Onset: On-going Pain Intervention(s): Rest;Shower Home Living/Prior Functioning Home Living Available Help at Discharge: Available 24 hours/day, Family Type of Home: House Home Access: Stairs to enter Technical brewer of Steps: 1 Home Layout: Multi-level, 1/2 bath on main level Alternate Level Stairs-Number of Steps: flight Bathroom Shower/Tub: Walk-in shower  Lives With: Spouse ADL ADL ADL Comments: see functional navigator Vision/Perception  Vision- History Baseline Vision/History: No visual deficits;Wears glasses Wears Glasses: Distance only Patient Visual Report: No change from baseline  Cognition Overall Cognitive Status: Within Functional Limits for tasks assessed Arousal/Alertness: Awake/alert Orientation Level: Person;Place;Situation Person: Oriented Place: Oriented Situation: Oriented Year: 2017 Month: November Day of Week: Correct Memory: Appears intact Immediate Memory Recall: Sock;Blue;Bed Memory Recall: Sock;Blue;Bed Memory Recall Sock: Without Cue Memory Recall Blue: Without Cue Memory Recall Bed: Without Cue Attention: Sustained Sustained Attention: Appears intact Awareness: Appears intact Problem Solving: Appears intact Safety/Judgment: Appears intact Sensation Sensation Light Touch: Appears Intact Stereognosis: Appears Intact Hot/Cold: Appears Intact Proprioception: Appears Intact Coordination Gross Motor Movements are Fluid and Coordinated: No Fine Motor Movements are Fluid and Coordinated: Yes Coordination and Movement Description: decr mobility and coorindation in LEs Motor  Motor Motor - Skilled Clinical Observations: generalized weakness, decr ROM and acute pain Mobility  Transfers Transfers: Sit to Stand;Stand to Sit Sit to Stand: 4: Min assist Stand to Sit: 4: Min assist  Trunk/Postural Assessment  Cervical Assessment Cervical Assessment: Within Functional Limits Thoracic  Assessment Thoracic Assessment: Within Functional Limits Lumbar Assessment Lumbar Assessment: Within Functional Limits Postural Control Postural Control: Within Functional Limits (delayed balance reactions)  Balance Balance Balance Assessed: Yes Dynamic Sitting Balance Dynamic Sitting - Balance Support: During functional activity Dynamic Sitting - Level of Assistance: 5: Stand by assistance Static Standing Balance Static Standing - Balance Support: During functional activity Static Standing - Level of Assistance: 4: Min assist Dynamic Standing Balance Dynamic Standing - Balance Support: During functional activity Dynamic Standing - Level of Assistance: 4: Min assist Extremity/Trunk Assessment RUE Assessment  RUE Assessment: Within Functional Limits LUE Assessment LUE Assessment: Within Functional Limits   See Function Navigator for Current Functional Status.   Refer to Care Plan for Long Term Goals  Recommendations for other services: None  Discharge Criteria: Patient will be discharged from OT if patient refuses treatment 3 consecutive times without medical reason, if treatment goals not met, if there is a change in medical status, if patient makes no progress towards goals or if patient is discharged from hospital.  The above assessment, treatment plan, treatment alternatives and goals were discussed and mutually agreed upon: by patient  Nicoletta Ba 07/08/2016, 8:57 AM

## 2016-07-09 ENCOUNTER — Inpatient Hospital Stay (HOSPITAL_COMMUNITY): Payer: BLUE CROSS/BLUE SHIELD

## 2016-07-09 ENCOUNTER — Inpatient Hospital Stay (HOSPITAL_COMMUNITY): Payer: BLUE CROSS/BLUE SHIELD | Admitting: Physical Therapy

## 2016-07-09 DIAGNOSIS — M79609 Pain in unspecified limb: Secondary | ICD-10-CM

## 2016-07-09 MED ORDER — NEOMYCIN-COLIST-HC-THONZONIUM 3.3-3-10-0.5 MG/ML OT SUSP
3.0000 [drp] | Freq: Four times a day (QID) | OTIC | Status: DC
Start: 2016-07-09 — End: 2016-07-09

## 2016-07-09 MED ORDER — NEOMYCIN-POLYMYXIN-HC 3.5-10000-1 OT SUSP
3.0000 [drp] | Freq: Four times a day (QID) | OTIC | Status: DC
  Administered 2016-07-09 – 2016-07-15 (×16): 3 [drp] via OTIC
  Filled 2016-07-09: qty 10

## 2016-07-09 NOTE — Progress Notes (Signed)
C/O pain in upper back and tingling in arms after ambulating with walker.  VS stable, skin warm and dry, color good.  Given oxycodone and robaxin.  Tingling in arms relieved and continues to have mild pain in back on movement, but relief is sufficient with pain meds.

## 2016-07-09 NOTE — Progress Notes (Signed)
*  Preliminary Results* Bilateral lower extremity venous duplex completed. Bilateral lower extremities are negative for deep vein thrombosis. There is no evidence of Baker's cyst bilaterally.  07/09/2016 8:57 AM Maudry Mayhew, BS, RVT, RDCS, RDMS

## 2016-07-09 NOTE — Progress Notes (Signed)
Patient ID: [name], female   DOB: 1951-03-08, 65 y.o.   MRN: [id number]    Patient ID: [name], female   DOB: 09-16-1950, 65 y.o.   MRN: [id number]   07/09/16.   65 year old patient admitted for CIR due to functional deficits following bilateral total knee replacement.  Patient is status post TKA on 07/03/2016 per Dr. [name].  The patient has received Xarelto for DVT prophylaxis.  The patient required 2 units of packed RBCs Postop.  Admitted 07/07/2016 and had a reasonably comfortable night.  Lower extremity venous ultrasound performed earlier this morning.  Reportedly free of DVT.  Past Medical History:  Diagnosis Date  . Arthritis    osteoarthritis knees mostly   Review of Systems  Constitutional: Negative for chills and fever.  HENT: Negative for hearing loss.   Eyes: Negative for blurred vision and double vision.  Respiratory: Negative for cough and shortness of breath.   Cardiovascular: Negative for chest pain, palpitations and leg swelling.  Gastrointestinal: Positive for constipation. Negative for nausea and vomiting.  Genitourinary: Negative for dysuria and hematuria.  Musculoskeletal: Positive for joint pain.  Skin: Negative for rash.  Neurological: Negative for seizures, weakness and headaches.  All other systems reviewed and are negative.   Physical Exam  Vitals reviewed. Constitutional: She is oriented to person, place, and time. She appears well-developed and well-nourished.  Alert female  HENT:  Minimal wax in the left canal with some serosanguineous drainage.  Tympanic membrane not well visualized.  Mild erythema of the canal Head: Normocephalic and atraumatic.  Eyes: Conjunctivae and EOM are normal.  Neck: Normal range of motion. Neck supple. No thyromegaly present.  Cardiovascular: Normal rate and regular rhythm.   Respiratory: Effort normal and breath sounds normal. No respiratory distress.  GI: Soft. Bowel sounds are normal. She exhibits no  distension.  Musculoskeletal: She exhibits edema and tenderness.  Neurological: She is alert and oriented to person, place, and time.   Skin: Skin is warm and dry.  Bilateral knee incisions are dressed; no erythema or drainage   Medical Problem List and Plan: 1.  Decreased functional mobility secondary to bilateral TKA 07/03/2016. Weightbearing as tolerated 2.  DVT Prophylaxis/Anticoagulation: Xarelto.  vascular studies negative.  07/09/2016 3. Pain Management: Celebrex 200 mg twice a day. Oxycodone and Robaxin as needed 4. Acute blood loss anemia. Continue iron supplement. Follow-up CBC 5.  Otitis externa left ear.  Will continue   Cortisporin otic drops   [name]

## 2016-07-09 NOTE — Progress Notes (Signed)
Physical Therapy Session Note  Patient Details  Name: JAISHA JAPP MRN: [id number] Date of Birth: 10-15-1950  Today's Date: 07/09/2016 PT Individual Time: [id number] PT Individual Time Calculation (min): 42 min    Short Term Goals: Week 1:  PT Short Term Goal 1 (Week 1): STG =LTG due to Estimated Length of Stay  Skilled Therapeutic Interventions/Progress Updates:    Pt received in recliner & agreeable to tx, noting 3-4/10 pain in BLE knees & RN made aware. Session focused on gait training x 125 ft + 180 ft with RW & steady assist fade to supervision. Pt ambulates with reciprocal gait pattern but decreased B knee flexion with verbal cuing to correct and fair demo by pt; pt limited by pain & edema in B knees. Pt negotiated 8 steps (3 inches) with B rails & steady assist with cuing for step-to pattern and increased B knee flexion to prevent circumduction to ascend step with fair demo by pt. Pt utilized nu-step level 1 x 14 minutes with scooting seat closer x 3 times for increased B knee ROM to tolerance; pt very pleased to utilize device. Discussed BLE edema management & educated pt to maintain BLE elevation while in recliner or bed and to perform BLE ankle pumps. At end of session pt left sitting in recliner with BLE elevated & ice packs donned, RN present & all needs within reach.   Pt did not utilize knee immobilizers on this date & no knee buckling was noted in either LE.  Therapy Documentation Precautions:  Precautions Precautions: Knee, Fall Required Braces or Orthoses: Knee Immobilizer - Left Knee Immobilizer - Left: Discontinue once straight leg raise with < 10 degree lag Restrictions Weight Bearing Restrictions: Yes RLE Weight Bearing: Weight bearing as tolerated LLE Weight Bearing: Weight bearing as tolerated   See Function Navigator for Current Functional Status.   Therapy/Group: Individual Therapy  Waunita Schooner 07/09/2016, 5:17 PM

## 2016-07-10 ENCOUNTER — Inpatient Hospital Stay (HOSPITAL_COMMUNITY): Payer: BLUE CROSS/BLUE SHIELD

## 2016-07-10 ENCOUNTER — Inpatient Hospital Stay (HOSPITAL_COMMUNITY): Payer: BLUE CROSS/BLUE SHIELD | Admitting: Occupational Therapy

## 2016-07-10 LAB — CBC WITH DIFFERENTIAL/PLATELET
BASOS ABS: 0.1 10*3/uL (ref 0.0–0.1)
Basophils Relative: 1 %
EOS PCT: 3 %
Eosinophils Absolute: 0.2 10*3/uL (ref 0.0–0.7)
HEMATOCRIT: 29.3 % — AB (ref 36.0–46.0)
HEMOGLOBIN: 9.8 g/dL — AB (ref 12.0–15.0)
LYMPHS ABS: 1.5 10*3/uL (ref 0.7–4.0)
LYMPHS PCT: 27 %
MCH: 30.8 pg (ref 26.0–34.0)
MCHC: 33.4 g/dL (ref 30.0–36.0)
MCV: 92.1 fL (ref 78.0–100.0)
Monocytes Absolute: 0.9 10*3/uL (ref 0.1–1.0)
Monocytes Relative: 16 %
NEUTROS ABS: 3 10*3/uL (ref 1.7–7.7)
Neutrophils Relative %: 53 %
PLATELETS: 260 10*3/uL (ref 150–400)
RBC: 3.18 MIL/uL — AB (ref 3.87–5.11)
RDW: 13.7 % (ref 11.5–15.5)
WBC: 5.6 10*3/uL (ref 4.0–10.5)

## 2016-07-10 LAB — COMPREHENSIVE METABOLIC PANEL
ALBUMIN: 2.8 g/dL — AB (ref 3.5–5.0)
ALK PHOS: 265 U/L — AB (ref 38–126)
ALT: 143 U/L — AB (ref 14–54)
AST: 100 U/L — AB (ref 15–41)
Anion gap: 6 (ref 5–15)
BILIRUBIN TOTAL: 1 mg/dL (ref 0.3–1.2)
BUN: 12 mg/dL (ref 6–20)
CALCIUM: 8.7 mg/dL — AB (ref 8.9–10.3)
CO2: 27 mmol/L (ref 22–32)
Chloride: 105 mmol/L (ref 101–111)
Creatinine, Ser: 0.72 mg/dL (ref 0.44–1.00)
GFR calc Af Amer: >60 mL/min (ref >60)
GFR calc non Af Amer: >60 mL/min (ref >60)
GLUCOSE: 107 mg/dL — AB (ref 65–99)
Potassium: 4.4 mmol/L (ref 3.5–5.1)
Sodium: 138 mmol/L (ref 135–145)
TOTAL PROTEIN: 5.4 g/dL — AB (ref 6.5–8.1)

## 2016-07-10 NOTE — IPOC Note (Signed)
Overall Plan of Care [facility]) Patient Details Name: [name] MRN: [id number] DOB: 11-03-1950  Admitting Diagnosis: B TKR  Hospital Problems: Active Problems:   S/p total knee replacement, bilateral   Constipation due to pain medication   Post-operative pain   Abnormality of gait     Functional Problem List: Nursing Bladder, Bowel, Endurance, Pain, Skin Integrity, Safety, Medication Management  PT Balance, Pain, Safety, Skin Integrity  OT Balance, Safety, Edema, Endurance, Motor, Pain, Skin Integrity  SLP    TR         Basic ADL's: OT Grooming, Bathing, Dressing, Toileting     Advanced  ADL's: OT       Transfers: PT Bed Mobility, Bed to Chair, Car, Furniture, Floor  OT Toilet, Metallurgist: PT Ambulation, Emergency planning/management officer, Stairs     Additional Impairments: OT None  SLP        TR      Anticipated Outcomes Item Anticipated Outcome  Self Feeding n/a  Swallowing      Basic self-care  mod I   Toileting  mod I    Bathroom Transfers mod I   Bowel/Bladder  patient will be continent of bowel and bladder with min asist  Transfers  Mod I with LRAD   Locomotion  Mod I with LRAD   Communication     Cognition     Pain  pain will be less than or equal to 4/10 with min asist  Safety/Judgment  patient will be free from falls/injury and displaying sound safety judgement   Therapy Plan: PT Intensity: Minimum of 1-2 x/day ,45 to 90 minutes PT Frequency: 5 out of 7 days PT Duration Estimated Length of Stay: 7-10 days  OT Intensity: Minimum of 1-2 x/day, 45 to 90 minutes OT Frequency: 5 out of 7 days OT Duration/Estimated Length of Stay: 7-10 days         Team Interventions: Nursing Interventions Bladder Management, Bowel Management, Pain Management, Medication Management, Skin Care/Wound Management, Discharge Planning  PT interventions Ambulation/gait training, Balance/vestibular training, Community reintegration, Discharge planning,  Disease management/prevention, DME/adaptive equipment instruction, Functional mobility training, Neuromuscular re-education, Pain management, Patient/family education, Skin care/wound management, Stair training, Therapeutic Activities, Therapeutic Exercise, UE/LE Strength taining/ROM, UE/LE Coordination activities, Visual/perceptual remediation/compensation, Wheelchair propulsion/positioning  OT Interventions Balance/vestibular training, Discharge planning, Pain management, Self Care/advanced ADL retraining, Therapeutic Activities, UE/LE Coordination activities, Therapeutic Exercise, Skin care/wound managment, Patient/family education, Functional mobility training, Community reintegration, Engineer, drilling, Psychosocial support, Splinting/orthotics, Wheelchair propulsion/positioning, UE/LE Strength taining/ROM  SLP Interventions    TR Interventions    SW/CM Interventions Discharge Planning, Barrister's clerk, Patient/Family Education    Team Discharge Planning: Destination: PT-Home ,OT- Home , SLP-  Projected Follow-up: PT-Outpatient PT, OT-  None, SLP-  Projected Equipment Needs: PT-To be determined, OT- To be determined, SLP-  Equipment Details: PT-Patient has RW , OT-  Patient/family involved in discharge planning: PT- Patient,  OT-Patient, SLP-   MD ELOS: 7d Medical Rehab Prognosis:  Excellent Assessment:65 year old right-handed female with unremarkable past medical history on no prescription medications. Per chart review patient lives with spouse independent prior to admission occasionally using a walker. Husband works during the day. Presented 06/30/2016 with bilateral knee pain secondary to osteoarthritis. Patient has failed nonsurgical conservative treatments including NSAIDs, corticosteroid injections and activity modification. No relief with conservative care and underwent bilateral TKA 07/03/2016 per Dr. [name]. Weightbearing as tolerated bilateral lower extremities.  Hospital course pain management. Placed on Xarelto for DVT prophylaxis. Acute blood  loss anemia 7.1 transfused 2 units of packed red blood cells improved to 9.7.     Now requiring 24/7 Rehab RN,MD, as well as CIR level PT, OT and SLP.  Treatment team will focus on ADLs and mobility with goals set at See Team Conference Notes for weekly updates to the plan of care

## 2016-07-10 NOTE — Care Management (Addendum)
[facility] Individual Statement of Services  Patient Name:  [name]  Date:  07/10/2016  Welcome to the [facility].  Our goal is to provide you with an individualized program based on your diagnosis and situation, designed to meet your specific needs.  With this comprehensive rehabilitation program, you will be expected to participate in at least 3 hours of rehabilitation therapies Monday-Friday, with modified therapy programming on the weekends.  Your rehabilitation program will include the following services:  Physical Therapy (PT), Occupational Therapy (OT), 24 hour per day rehabilitation nursing, Case Management (Social Worker), Rehabilitation Medicine, Nutrition Services and Pharmacy Services  Weekly team conferences will be held on Wednesday to discuss your progress.  Your Social Worker will talk with you frequently to get your input and to update you on team discussions.  Team conferences with you and your family in attendance may also be held.  Expected length of stay: 7-10 days  Overall anticipated outcome: mod/i level  Depending on your progress and recovery, your program may change. Your Social Worker will coordinate services and will keep you informed of any changes. Your Social Worker's name and contact numbers are listed  below.  The following services may also be recommended but are not provided by the [facility]  Vocational rehabilitation   Arrangements will be made to provide these services after discharge if needed.  Arrangements include referral to agencies that provide these services.  Your insurance has been verified to be:  [place] Your primary doctor is:  [name]  Pertinent information will be shared with your doctor and your insurance company.  Social Worker:  Ovidio Kin, [place] or ([phone]  Information discussed with and copy given to patient by: Elease Hashimoto, 07/10/2016, 9:29 AM

## 2016-07-10 NOTE — Progress Notes (Signed)
Social Work Assessment and Plan Social Work Assessment and Plan  Patient Details  Name: [name] MRN: [id number] Date of Birth: 08-13-1951  Today's Date: 07/10/2016  Problem List:  Patient Active Problem List   Diagnosis Date Noted  . 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 Constipation due to pain medication   . Post-operative pain   . Abnormality of gait   . Acute blood loss anemia 07/06/2016  . S/P bilateral TKAs 07/03/2016   Past Medical History:  Past Medical History:  Diagnosis Date  . Arthritis    osteoarthritis knees mostly   Past Surgical History:  Past Surgical History:  Procedure Laterality Date  . DIAGNOSTIC LAPAROSCOPY     ovarian cystectomy/ BSO done.  Marland Kitchen HARDWARE REMOVAL Left 07/03/2016   Procedure: LEFT KNEE HARDWARE REMOVAL;  Surgeon: Paralee Cancel, MD;  Location: [facility];  Service: Orthopedics;  Laterality: Left;  . KNEE SURGERY Bilateral    ACL x2, meniscectomy x3, open patella(bone from other knee)  . ROTATOR CUFF REPAIR Right   . TOTAL KNEE ARTHROPLASTY Bilateral 07/03/2016   Procedure: TOTAL KNEE BILATERAL ARTHROPLASTY;  Surgeon: Paralee Cancel, MD;  Location: [facility];  Service: Orthopedics;  Laterality: Bilateral;   Social History:  reports that she has never smoked. She has never used smokeless tobacco. She reports that she drinks alcohol. She reports that she does not use drugs.  Family / Support Systems Marital Status: Married Patient Roles: Spouse, Parent, Other (Comment) (Employee) Spouse/Significant Other: Darnell Level [phone] Children: daughter grown Other Supports: Friends and colleagues Anticipated Caregiver: Husband Ability/Limitations of Caregiver: husband can take some time off of work to assist if needed. Caregiver Availability: Other (Comment) (short time 24 hr) Family Dynamics: Close knit family who will assist if needed. Pt is one to push herself and has to be told to slow down, she will  probalby reach mod/i level by discharge from here.  She reports they have good social supports.  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 She has been thorugh multiple surgeries thorughout her life and this is just one more. She has alwasy been independent and plans to be again when leaves here. Her quality of life is based upon her activity-playing tennis and golf. Recent Psychosocial Issues: other health issues-they are managed Pyschiatric History: No history-deferred depression screen due to pt is coping appropriately at this time and will be a short length of stay here. Substance Abuse History: No issues  Patient / Family Perceptions, Expectations & Goals Pt/Family understanding of illness & functional limitations: Pt is able to explain her B-TKR and reports has neede dthme for the last 10 years, she was putting it off. She has a good understanding of her treatment plan and is very involved in it. 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, Wife, Mother, Daughter, friend, etc Anticipated changes in roles/activities/participation: resume Pt/family expectations/goals: Pt states: " I plan to be independent when I leave here in one  week."  Husband states: " She will do it once her mind is made up."  [employer]: Other (Comment) (had with other surgeries) Premorbid Home Care/DME Agencies: Other (Comment) (has DME from previous surgeries) Transportation available at discharge: Husband  Discharge Planning Living Arrangements: Spouse/significant other Support Systems: Spouse/significant other, Children, Friends/neighbors, Immunologist, Parent Type of Residence: Private residence Insurance Resources: Multimedia programmer (specify) Nurse, mental health) Financial Resources: Employment, Secondary school teacher Screen Referred: No Living Expenses: Own Money Management: Spouse, Patient Does the patient have any problems obtaining your medications?: No Home Management: Both pt does travel a lot Patient/Family Preliminary Plans: Return home with husband who plans to take some time off to assist her if needed. Pt plans to go to OP from here and continue her rehab. She is very involved in her care and treatment planning, she realizes it will be painful but is prepared for this. Social Work Anticipated Follow Up Needs: HH/OP  Clinical Impression Very refreshing pt who is ready to push herself and achieve the goals she has set for herself. 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 Will be a short length of stay and will need either OP versus HH therapies upon discharge. Will work with her on her goals.  Elease Hashimoto 07/10/2016, 11:59 AM

## 2016-07-10 NOTE — Progress Notes (Signed)
65 year old right-handed female with unremarkable past medical history on no prescription medications. Per chart review patient lives with spouse independent prior to admission occasionally using a walker. Husband works during the day. Presented 06/30/2016 with bilateral knee pain secondary to osteoarthritis. Patient has failed nonsurgical conservative treatments including NSAIDs, corticosteroid injections and activity modification. No relief with conservative care and underwent bilateral TKA 07/03/2016 per Dr. [name]. Weightbearing as tolerated bilateral lower extremities. Hospital course pain management. Placed on Xarelto for DVT prophylaxis. Acute blood loss anemia 7.1 transfused 2 units of packed red blood cells improved to 9.7  Subjective/Complaints: Pt without pain c/os, legs feel a bit tingly  ROS- neg CP , SOB, N/V/D  Objective: Vital Signs: Blood pressure 129/72, pulse 76, temperature 98.1 F (36.7 C), temperature source Oral, resp. rate 18, height _0  (1.702 m), weight 74 kg (163 lb 1.6 oz), SpO2 99 %. No results found. Results for orders placed or performed during the hospital encounter of 07/07/16 (from the past 72 hour(s))  CBC WITH DIFFERENTIAL     Status: Abnormal   Collection Time: 07/10/16  5:19 AM  Result Value Ref Range   WBC 5.6 4.0 - 10.5 K/uL   RBC 3.18 (L) 3.87 - 5.11 MIL/uL   Hemoglobin 9.8 (L) 12.0 - 15.0 g/dL   HCT 29.3 (L) 36.0 - 46.0 %   MCV 92.1 78.0 - 100.0 fL   MCH 30.8 26.0 - 34.0 pg   MCHC 33.4 30.0 - 36.0 g/dL   RDW 13.7 11.5 - 15.5 %   Platelets 260 150 - 400 K/uL   Neutrophils Relative % 53 %   Neutro Abs 3.0 1.7 - 7.7 K/uL   Lymphocytes Relative 27 %   Lymphs Abs 1.5 0.7 - 4.0 K/uL   Monocytes Relative 16 %   Monocytes Absolute 0.9 0.1 - 1.0 K/uL   Eosinophils Relative 3 %   Eosinophils Absolute 0.2 0.0 - 0.7 K/uL   Basophils Relative 1 %   Basophils Absolute 0.1 0.0 - 0.1 K/uL  Comprehensive metabolic panel     Status: Abnormal   Collection  Time: 07/10/16  5:19 AM  Result Value Ref Range   Sodium 138 135 - 145 mmol/L   Potassium 4.4 3.5 - 5.1 mmol/L   Chloride 105 101 - 111 mmol/L   CO2 27 22 - 32 mmol/L   Glucose, Bld 107 (H) 65 - 99 mg/dL   BUN 12 6 - 20 mg/dL   Creatinine, Ser 0.72 0.44 - 1.00 mg/dL   Calcium 8.7 (L) 8.9 - 10.3 mg/dL   Total Protein 5.4 (L) 6.5 - 8.1 g/dL   Albumin 2.8 (L) 3.5 - 5.0 g/dL   AST 100 (H) 15 - 41 U/L   ALT 143 (H) 14 - 54 U/L   Alkaline Phosphatase 265 (H) 38 - 126 U/L   Total Bilirubin 1.0 0.3 - 1.2 mg/dL   GFR calc non Af Amer >60 >60 mL/min   GFR calc Af Amer >60 >60 mL/min    Comment: (NOTE) The eGFR has been calculated using the CKD EPI equation. This calculation has not been validated in all clinical situations. eGFR's persistently <60 mL/min signify possible Chronic Kidney Disease.    Anion gap 6 5 - 15      General: No acute distress Mood and affect are appropriate Heart: Regular rate and rhythm no rubs murmurs or extra sounds Lungs: Clear to auscultation, breathing unlabored, no rales or wheezes Abdomen: Positive bowel sounds, soft nontender to palpation, nondistended  Extremities: No clubbing, cyanosis, or edema Skin: No evidence of breakdown, no evidence of rash Neurologic: Cranial nerves II through XII intact, motor strength is 5/5 in bilateral deltoid, bicep, tricep, grip,4- hip flexor, knee extensors, 4/5 ankle dorsiflexor and plantar flexor Sensory exam normal sensation to light touch lower extremities  Musculoskeletal: Full range of motion in all 4 extremities. No joint swelling   Assessment/Plan: 1. Functional deficits secondary to B TKR for end stage OA which require 3+ hours per day of interdisciplinary therapy in a comprehensive inpatient rehab setting. Physiatrist is providing close team supervision and 24 hour management of active medical problems listed below. Physiatrist and rehab team continue to assess barriers to discharge/monitor patient progress  toward functional and medical goals. FIM: Function - Bathing Position: Shower Body parts bathed by patient: Right arm, Left arm, Chest, Abdomen, Front perineal area, Buttocks, Right upper leg, Left upper leg Body parts bathed by helper: Right lower leg, Left lower leg, Back Assist Level: Touching or steadying assistance(Pt > 75%)  Function- Upper Body Dressing/Undressing What is the patient wearing?: Pull over shirt/dress Pull over shirt/dress - Perfomed by patient: Thread/unthread right sleeve, Thread/unthread left sleeve, Put head through opening, Pull shirt over trunk Assist Level: Set up Set up : To obtain clothing/put away Function - Lower Body Dressing/Undressing What is the patient wearing?: Underwear, Pants Position: Wheelchair/chair at sink Underwear - Performed by patient: Thread/unthread left underwear leg, Thread/unthread right underwear leg, Pull underwear up/down Underwear - Performed by helper: Thread/unthread left underwear leg, Thread/unthread right underwear leg Pants- Performed by patient: Thread/unthread right pants leg, Thread/unthread left pants leg, Pull pants up/down, Fasten/unfasten pants Pants- Performed by helper: Thread/unthread left pants leg, Thread/unthread right pants leg Shoes - Performed by helper: Don/doff right shoe, Don/doff left shoe, Fasten right, Fasten left TED Hose - Performed by helper: Don/doff right TED hose, Don/doff left TED hose Assist for footwear: Supervision/touching assist Assist for lower body dressing: Touching or steadying assistance (Pt > 75%)  Function - Toileting Toileting steps completed by patient: Adjust clothing prior to toileting, Performs perineal hygiene, Adjust clothing after toileting Toileting Assistive Devices: Grab bar or rail Assist level: Supervision or verbal cues  Function - Air cabin crew transfer assistive device: Walker, Elevated toilet seat/BSC over toilet Assist level to toilet: Touching or  steadying assistance (Pt > 75%) Assist level from toilet: Touching or steadying assistance (Pt > 75%)  Function - Chair/bed transfer Chair/bed transfer method: Stand pivot Chair/bed transfer assist level: Supervision or verbal cues Chair/bed transfer assistive device: Bedrails, Walker  Function - Locomotion: Wheelchair Will patient use wheelchair at discharge?: No Type: Manual Max wheelchair distance: 143f  Assist Level: Supervision or verbal cues Assist Level: Supervision or verbal cues Assist Level: Supervision or verbal cues Function - Locomotion: Ambulation Assistive device: Walker-rolling Max distance: 180 ft Assist level: Supervision or verbal cues Assist level: Supervision or verbal cues Assist level: Supervision or verbal cues Assist level: Supervision or verbal cues Assist level: Touching or steadying assistance (Pt > 75%)  Function - Comprehension Comprehension: Auditory Comprehension assist level: Follows complex conversation/direction with no assist  Function - Expression Expression: Verbal Expression assist level: Expresses complex ideas: With no assist  Function - Social Interaction Social Interaction assist level: Interacts appropriately with others - No medications needed.  Function - Problem Solving Problem solving assist level: Solves complex problems: Recognizes & self-corrects  Function - Memory Memory assist level: Complete Independence: No helper Patient normally able to recall (first 3 days only): That he  or she is in a hospital, Staff names and faces, Current season Medical Problem List and Plan: 1.  Decreased functional mobility secondary to bilateral TKA 07/03/2016. Weightbearing as tolerated 2.  DVT Prophylaxis/Anticoagulation: Xarelto. Check vascular studies 3. Pain Management: Celebrex 200 mg twice a day. Oxycodone and Robaxin as needed 4. Acute blood loss anemia. Continue iron supplement. Follow-up CBC CBC Latest Ref Rng & Units 07/10/2016  07/07/2016 07/06/2016  WBC 4.0 - 10.5 K/uL 5.6 6.2 6.5  Hemoglobin 12.0 - 15.0 g/dL 9.8(L) 9.7(L) 7.1(L)  Hematocrit 36.0 - 46.0 % 29.3(L) 28.0(L) 21.3(L)  Platelets 150 - 400 K/uL 260 178 146(L)   5. Neuropsych: This patient is capable of making decisions on her own behalf. 6. Skin/Wound Care: Routine skin checks 7. Fluids/Electrolytes/Nutrition: Routine I&O with follow-up chemistries 8. Constipation. Laxative assistance    LOS (Days) 3 A FACE TO FACE EVALUATION WAS PERFORMED  Ceria Suminski E 07/10/2016, 6:59 AM

## 2016-07-10 NOTE — Progress Notes (Signed)
Occupational Therapy Session Note  Patient Details  Name: [name] MRN: [id number] Date of Birth: 11/11/1950  Today's Date: 07/10/2016 OT Individual Time: [id number] and [id number] OT Individual Time Calculation (min): 61 min and 59 minutes    Short Term Goals: Week 1:  OT Short Term Goal 1 (Week 1): STG=LTG  Skilled Therapeutic Interventions/Progress Updates:   Skilled OT session completed with focus on increasing LE flexibility/ROM and use of AE during self care completion. Pt was agreeable to shower at start of session. Knee bandages covered. Pt gathered ADL items with RW and Min A fading to supervision while ambulating in room. Pt completed bathing at supervision level leaning laterally with use of LH sponge. Dressing completed with Min A for socks with pt able to cross ankle over opposite ankle for donning pants. Pt completed oral care/grooming while standing at sink with supervision and RW. At end of session pt transferred to toilet and was educated on use of call bell to notify nursing when finished. Nursing made aware of pts position.   2nd Session 1:1 Tx (59 minutes) Pt participated in skilled OT session focusing on d/c planning for IADL completion at home. Pt had multiple questions regarding meal prep/kitchen mobility, ergonomic work spaces, and pet care. Pt ambulated to therapy kitchen with supervision and completed simulated meal prep tasks with pt-therapist collaboration regarding carryover for home. Pt instructed on adaptations for transporting items with walker as well as using AE for pet care. Pt provided with online resources regarding purchase of AE and education on proper uses and options. Pt was also provided education on ergonomic standing desks with simple methods for constructing with household items. Pt plans to work from home at time of d/c. DME needs discussed with pt verbalizing need for [facility] and tub bench (pt prefers bench in her walk in shower). Pt provided dimensions  for bench per request. Pt wrote down recommendations and reported that session was very helpful in planning for d/c. At end of tx pt ambulated back to room and was left in recliner with ice packs on knees and all needs within reach.   Therapy Documentation Precautions:  Precautions Precautions: Knee, Fall Required Braces or Orthoses: Knee Immobilizer - Left Knee Immobilizer - Left: Discontinue once straight leg raise with < 10 degree lag Restrictions Weight Bearing Restrictions: Yes RLE Weight Bearing: Weight bearing as tolerated LLE Weight Bearing: Weight bearing as tolerated  Pain: No c/o pain during session    ADL: ADL ADL Comments: see functional navigator    See Function Navigator for Current Functional Status.   Therapy/Group: Individual Therapy  Brasen Bundren A Kairie Vangieson 07/10/2016, 12:42 PM

## 2016-07-10 NOTE — Progress Notes (Signed)
Patient information reviewed and entered into [facility] system by Taiesha Bovard, RN, CRRN, PPS Coordinator.  Information including medical coding and functional independence measure will be reviewed and updated through discharge.    

## 2016-07-10 NOTE — Progress Notes (Signed)
Physical Therapy Note  Patient Details  Name: [name] MRN: [id number] Date of Birth: August 09, 1951 Today's Date: 07/10/2016  [id number], 90 min individual tx Pain: 4/10, bil knees, premedicated  Gait with RW to/from gym with supervision.  Pt noted to have increased R hip external rotation during gait, and states her R LE rolls out completely when in bed.  Neuromuscular re-education via visual feedback, multimodal cues and demo for alternating reciprocal movement on NuStep at level 3 focusing on flexibility, R hip neutral rotation, and trunk rotation. 15 x 1 each seated bil heel raises, R/L shoulder flexion with bil scapular adduction, L/R long arc quad knee ext, static positioning for increasing knee flexion, toe raises with knees flexed, bil hip adduction with internal rotation; 20 x 1 each R/L kicking soft ball focusing on isolating quads. Also seated self- assisted R/L knee flexion. Sit >< stand blocked practice focusing on increasing knee flexion.   1 cm heel lift placed in L shoe as pt is unable to fully extend R knee in standing, also PTA.  Gait returning to room demonstrated improved R knee extension in SLS.  Pt left resting in recliner with ice packs on bil knees, all needs within reach.  Eriko Economos 07/10/2016, 7:54 AM

## 2016-07-11 ENCOUNTER — Inpatient Hospital Stay (HOSPITAL_COMMUNITY): Payer: BLUE CROSS/BLUE SHIELD

## 2016-07-11 ENCOUNTER — Inpatient Hospital Stay (HOSPITAL_COMMUNITY): Payer: BLUE CROSS/BLUE SHIELD | Admitting: Physical Therapy

## 2016-07-11 NOTE — Progress Notes (Signed)
Occupational Therapy Session Note  Patient Details  Name: [name] MRN: [id number] Date of Birth: 04/22/1951  Today's Date: 07/11/2016 OT Individual Time: 1000-1100 OT Individual Time Calculation (min): 60 min     Short Term Goals: Week 1:  OT Short Term Goal 1 (Week 1): STG=LTG  Skilled Therapeutic Interventions/Progress Updates:    Pt resting in recliner upon arrival.  Pt stated she had already washed up and donned clean clothing.  Pt , amb with RW to ADL apartment and practiced simple home mgmt tasks.  Discussed bathroom arrangement and made equipment recommendations.  Issued RW bag and reacher bag.  Continued discharge planning, home safety recommendations, office setup, kitchen setup, and bathroom setup.  Pt returned to room and recliner with all needs within reach.  Therapy Documentation Precautions:  Precautions Precautions: Knee, Fall Required Braces or Orthoses: Knee Immobilizer - Left Knee Immobilizer - Left: Discontinue once straight leg raise with < 10 degree lag Restrictions Weight Bearing Restrictions: Yes RLE Weight Bearing: Weight bearing as tolerated LLE Weight Bearing: Weight bearing as tolerated General:   Vital Signs:   Pain: Pain Assessment Pain Assessment: 0-10 Pain Score: 4  ADL: ADL ADL Comments: see functional navigator Exercises:   Other Treatments:    See Function Navigator for Current Functional Status.   Therapy/Group: Individual Therapy  Leroy Libman 07/11/2016, 11:22 AM

## 2016-07-11 NOTE — Progress Notes (Signed)
Subjective/Complaints: Good BM yesterday Pain controlled with oxy and Robaxin  ROS- neg CP , SOB, N/V/D  Objective: Vital Signs: Blood pressure 124/60, pulse 84, temperature 97.8 F (36.6 C), temperature source Oral, resp. rate 18, height _0  (1.702 m), weight 74 kg (163 lb 1.6 oz), SpO2 99 %. No results found. Results for orders placed or performed during the hospital encounter of 07/07/16 (from the past 72 hour(s))  CBC WITH DIFFERENTIAL     Status: Abnormal   Collection Time: 07/10/16  5:19 AM  Result Value Ref Range   WBC 5.6 4.0 - 10.5 K/uL   RBC 3.18 (L) 3.87 - 5.11 MIL/uL   Hemoglobin 9.8 (L) 12.0 - 15.0 g/dL   HCT 29.3 (L) 36.0 - 46.0 %   MCV 92.1 78.0 - 100.0 fL   MCH 30.8 26.0 - 34.0 pg   MCHC 33.4 30.0 - 36.0 g/dL   RDW 13.7 11.5 - 15.5 %   Platelets 260 150 - 400 K/uL   Neutrophils Relative % 53 %   Neutro Abs 3.0 1.7 - 7.7 K/uL   Lymphocytes Relative 27 %   Lymphs Abs 1.5 0.7 - 4.0 K/uL   Monocytes Relative 16 %   Monocytes Absolute 0.9 0.1 - 1.0 K/uL   Eosinophils Relative 3 %   Eosinophils Absolute 0.2 0.0 - 0.7 K/uL   Basophils Relative 1 %   Basophils Absolute 0.1 0.0 - 0.1 K/uL  Comprehensive metabolic panel     Status: Abnormal   Collection Time: 07/10/16  5:19 AM  Result Value Ref Range   Sodium 138 135 - 145 mmol/L   Potassium 4.4 3.5 - 5.1 mmol/L   Chloride 105 101 - 111 mmol/L   CO2 27 22 - 32 mmol/L   Glucose, Bld 107 (H) 65 - 99 mg/dL   BUN 12 6 - 20 mg/dL   Creatinine, Ser 0.72 0.44 - 1.00 mg/dL   Calcium 8.7 (L) 8.9 - 10.3 mg/dL   Total Protein 5.4 (L) 6.5 - 8.1 g/dL   Albumin 2.8 (L) 3.5 - 5.0 g/dL   AST 100 (H) 15 - 41 U/L   ALT 143 (H) 14 - 54 U/L   Alkaline Phosphatase 265 (H) 38 - 126 U/L   Total Bilirubin 1.0 0.3 - 1.2 mg/dL   GFR calc non Af Amer >60 >60 mL/min   GFR calc Af Amer >60 >60 mL/min    Comment: (NOTE) The eGFR has been calculated using the CKD EPI equation. This calculation has not been validated in all  clinical situations. eGFR's persistently <60 mL/min signify possible Chronic Kidney Disease.    Anion gap 6 5 - 15      General: No acute distress Mood and affect are appropriate Heart: Regular rate and rhythm no rubs murmurs or extra sounds Lungs: Clear to auscultation, breathing unlabored, no rales or wheezes Abdomen: Positive bowel sounds, soft nontender to palpation, nondistended Extremities: No clubbing, cyanosis, or edema Skin: No evidence of breakdown, no evidence of rash Neurologic: Cranial nerves II through XII intact, motor strength is 5/5 in bilateral deltoid, bicep, tricep, grip,4- hip flexor, knee extensors, 4/5 ankle dorsiflexor and plantar flexor Sensory exam normal sensation to light touch lower extremities  Musculoskeletal: Full range of motion in all 4 extremities. No joint swelling   Assessment/Plan: 1. Functional deficits secondary to B TKR for end stage OA which require 3+ hours per day of interdisciplinary therapy in a comprehensive inpatient rehab setting. Physiatrist is providing close team supervision  and 24 hour management of active medical problems listed below. Physiatrist and rehab team continue to assess barriers to discharge/monitor patient progress toward functional and medical goals. FIM: Function - Bathing Position: Shower Body parts bathed by patient: Right arm, Left arm, Chest, Abdomen, Front perineal area, Buttocks, Right upper leg, Left upper leg, Right lower leg, Left lower leg, Back Body parts bathed by helper: Right lower leg, Left lower leg, Back Assist Level: Supervision or verbal cues  Function- Upper Body Dressing/Undressing What is the patient wearing?: Pull over shirt/dress Pull over shirt/dress - Perfomed by patient: Thread/unthread right sleeve, Thread/unthread left sleeve, Put head through opening, Pull shirt over trunk Assist Level: Set up Set up : To obtain clothing/put away Function - Lower Body Dressing/Undressing What is the  patient wearing?: Underwear, Pants, Non-skid slipper socks, Ted Hose Position: Sitting EOB Underwear - Performed by patient: Thread/unthread left underwear leg, Thread/unthread right underwear leg, Pull underwear up/down Underwear - Performed by helper: Thread/unthread left underwear leg, Thread/unthread right underwear leg Pants- Performed by patient: Thread/unthread right pants leg, Thread/unthread left pants leg, Pull pants up/down, Fasten/unfasten pants Pants- Performed by helper: Thread/unthread left pants leg, Thread/unthread right pants leg Non-skid slipper socks- Performed by helper: Don/doff right sock, Don/doff left sock Shoes - Performed by helper: Don/doff right shoe, Don/doff left shoe, Fasten right, Fasten left TED Hose - Performed by helper: Don/doff right TED hose, Don/doff left TED hose Assist for footwear: Maximal assist Assist for lower body dressing: Touching or steadying assistance (Pt > 75%)  Function - Toileting Toileting steps completed by patient: Adjust clothing prior to toileting, Performs perineal hygiene, Adjust clothing after toileting Toileting Assistive Devices: Grab bar or rail Assist level: Supervision or verbal cues  Function - Air cabin crew transfer assistive device: Walker, Elevated toilet seat/BSC over toilet Assist level to toilet: Supervision or verbal cues Assist level from toilet: Touching or steadying assistance (Pt > 75%)  Function - Chair/bed transfer Chair/bed transfer method: Ambulatory Chair/bed transfer assist level: Supervision or verbal cues Chair/bed transfer assistive device: Environmental consultant, Armrests Chair/bed transfer details: Verbal cues for technique  Function - Locomotion: Wheelchair Will patient use wheelchair at discharge?: No Type: Manual Max wheelchair distance: 141f  Assist Level: Supervision or verbal cues Assist Level: Supervision or verbal cues Assist Level: Supervision or verbal cues Function - Locomotion:  Ambulation Assistive device: Walker-rolling Max distance: 150 Assist level: Supervision or verbal cues Assist level: Supervision or verbal cues Assist level: Supervision or verbal cues Assist level: Supervision or verbal cues Assist level: Touching or steadying assistance (Pt > 75%)  Function - Comprehension Comprehension: Auditory Comprehension assist level: Follows complex conversation/direction with no assist  Function - Expression Expression: Verbal Expression assist level: Expresses complex ideas: With no assist  Function - Social Interaction Social Interaction assist level: Interacts appropriately with others - No medications needed.  Function - Problem Solving Problem solving assist level: Solves complex problems: Recognizes & self-corrects  Function - Memory Memory assist level: Complete Independence: No helper Patient normally able to recall (first 3 days only): That he or she is in a hospital, Staff names and faces, Current season Medical Problem List and Plan: 1.  Decreased functional mobility secondary to bilateral TKA 07/03/2016. Weightbearing as tolerated CIR PT, OT 2.  DVT Prophylaxis/Anticoagulation: Xarelto. Check vascular studies 3. Pain Management: Celebrex 200 mg twice a day. Oxycodone and Robaxin as needed 4. Acute blood loss anemia. Continue iron supplement. Follow-up CBC- no changes CBC Latest Ref Rng & Units 07/10/2016 07/07/2016 07/06/2016  WBC 4.0 - 10.5 K/uL 5.6 6.2 6.5  Hemoglobin 12.0 - 15.0 g/dL 9.8(L) 9.7(L) 7.1(L)  Hematocrit 36.0 - 46.0 % 29.3(L) 28.0(L) 21.3(L)  Platelets 150 - 400 K/uL 260 178 146(L)   5. Neuropsych: This patient is capable of making decisions on her own behalf. 6. Skin/Wound Care: Routine skin checks 7. Fluids/Electrolytes/Nutrition: Routine I&O with follow-up chemistries 8. Constipation. Laxative assistance 9.  Hypoalb- prostat   LOS (Days) 4 A FACE TO FACE EVALUATION WAS PERFORMED  [name] 07/11/2016, 7:04  AM

## 2016-07-11 NOTE — Progress Notes (Signed)
Patient complaining of increases pain to the left knee, radiating down the outside of her left leg. She stated "this started last night when I was moving in bed and felt a pop". D. Harvey, PA notidfied.

## 2016-07-11 NOTE — Progress Notes (Signed)
Physical Therapy Session Note  Patient Details  Name: [name] MRN: [id number] Date of Birth: 1950-12-03  Today's Date: 07/11/2016 PT Individual Time: 0200-0215 PT Individual Time Calculation (min): 15 min    Short Term Goals: Week 1:  PT Short Term Goal 1 (Week 1): STG =LTG due to Estimated Length of Stay  Skilled Therapeutic Interventions/Progress Updates:    Patient in recliner with BLE elevated, reporting increased L knee pain and describing pain shooting into foot and up into back. Patient with increased difficulty answering questions due to shooting pain and grimacing. RN aware and reports x-ray ordered. Patient provided with new ice packs applied to R knee and hot pack applied to back. Patient declined to reposition/transfer to bed or participate in therapy due to increased pain, will f/u as able.   Therapy Documentation Precautions:  Precautions Precautions: Knee, Fall Required Braces or Orthoses: Knee Immobilizer - Left Knee Immobilizer - Left: Discontinue once straight leg raise with < 10 degree lag Restrictions Weight Bearing Restrictions: Yes RLE Weight Bearing: Weight bearing as tolerated LLE Weight Bearing: Weight bearing as tolerated General: PT Amount of Missed Time (min): 30 Minutes PT Missed Treatment Reason: Pain Pain: Pain Assessment Pain Assessment: Faces Faces Pain Scale: Hurts worst Pain Type: Acute pain Pain Location: Back Pain Descriptors / Indicators: Shooting Pain Onset: Sudden Pain Intervention(s): RN made aware;Cold applied   See Function Navigator for Current Functional Status.   Therapy/Group: Individual Therapy  Laretta Alstrom 07/11/2016, 2:21 PM

## 2016-07-11 NOTE — Progress Notes (Signed)
Physical Therapy Session Note  Patient Details  Name: [name] MRN: [id number] Date of Birth: Sep 20, 1950  Today's Date: 07/11/2016 PT Individual Time: [phone] and [id number] PT Individual Time Calculation (min): 60 min and 30 min   Short Term Goals: Week 1:  PT Short Term Goal 1 (Week 1): STG =LTG due to Estimated Length of Stay  Skilled Therapeutic Interventions/Progress Updates:    Treatment 1: Pt received in bed & agreeable to tx, noting 2/10 pain in R knee & 5/10 pain in L knee. Pt reports repositioning in bed last night & feeling as though L knee twisted & has experienced increased pain in joint since then and RN aware. Discussed d/c plans with pt & educated pt on benefits of HHPT vs OPPT with this therapist recommending OPPT with pt stating she needs to follow up with her insurance company. Pt transferred to sitting EOB & doffed shorts & donned underwear & clean shorts with supervision for sit<>stand. Pt ambulated room>gym with RW & supervision with decreased B knee flexion. Utilized nu-step level 1 x 15 minutes with BLE with increasing seat position with task focusing on reducing stiffness & increasing ROM. Pt transferred to supine on mat table & performed BLE short arc quads, straight leg raises & hip abduction with minimal verbal cuing for proper technique. Pt ambulated back to room with RW & supervision with therapist educating pt on need for therapist to check pt's family/friends off to assist pt with transfers/ambulation & pt voiced understanding. Educated pt on performing heel slides every hour while sitting in recliner to prevent stiffness during day. At end of session pt left sitting in recliner with all needs within reach.   Treatment 2: Pt received in recliner noting 4/10 pain in L knee but agreeable to tx. Pt noted severe L knee pain earlier this afternoon but RN cleared pt for tx this afternoon & pt requesting to utilize nu-step. Pt ambulated room<>gym with RW & close  supervision ~125 ft with very minimal L knee flexion during swing phase. Utilized nu-step on level 1 x 15 minutes for B knee ROM to reduce stiffness. Educated pt to alternate 20 minute on/20 minutes off with cold packs on knees and physiological affects of leaving cold pack on for too long; pt verbalized understanding. At end of session pt left sitting in recliner with all needs within reach. Pt very motivated to push herself during therapy, requiring education for safety and appropriate progression of activity.  Therapy Documentation Precautions:  Precautions Precautions: Knee, Fall Required Braces or Orthoses: Knee Immobilizer - Left Knee Immobilizer - Left: Discontinue once straight leg raise with < 10 degree lag Restrictions Weight Bearing Restrictions: Yes RLE Weight Bearing: Weight bearing as tolerated LLE Weight Bearing: Weight bearing as tolerated    See Function Navigator for Current Functional Status.   Therapy/Group: Individual Therapy  Waunita Schooner 07/11/2016, 10:42 AM

## 2016-07-12 ENCOUNTER — Inpatient Hospital Stay (HOSPITAL_COMMUNITY): Payer: BLUE CROSS/BLUE SHIELD

## 2016-07-12 ENCOUNTER — Inpatient Hospital Stay (HOSPITAL_COMMUNITY): Payer: BLUE CROSS/BLUE SHIELD | Admitting: Physical Therapy

## 2016-07-12 ENCOUNTER — Inpatient Hospital Stay (HOSPITAL_COMMUNITY): Payer: BLUE CROSS/BLUE SHIELD | Admitting: Occupational Therapy

## 2016-07-12 LAB — URINE MICROSCOPIC-ADD ON

## 2016-07-12 LAB — URINALYSIS, ROUTINE W REFLEX MICROSCOPIC
BILIRUBIN URINE: NEGATIVE
Glucose, UA: NEGATIVE mg/dL
KETONES UR: NEGATIVE mg/dL
NITRITE: NEGATIVE
PROTEIN: NEGATIVE mg/dL
SPECIFIC GRAVITY, URINE: 1.026 (ref 1.005–1.030)
pH: 5.5 (ref 5.0–8.0)

## 2016-07-12 NOTE — Progress Notes (Signed)
Physical Therapy Note  Patient Details  Name: [name] MRN: [id number] Date of Birth: 08-30-1951 Today's Date: 07/12/2016  [id number], 75 min individual min Pain:3/10 bil knees, premedicated  Pt's L knee with increased swelling and decreased AROM vs 11/6 when this PT last saw pt.  PT conuslted with Pam, PA who will contact Dr. Aurea Graff PA regarding edema. Pam requested PT wrap L knee with ACE today. Gait with RW with supervision to/from therapy gym, focusing on upright posture, forward gaze, increasing velocity and L knee flexion.  NuStep at level 1 for flexibility with increasing knee flexion via movement of seat forward.  AROM measured in supine with R/L hanging off of mat = 75 degrees/64 degrees. Up/down 12 steps bil rails, descending sideways step toon 6" stpes, otherwise step through method. In standing, calf raises and mini squats with bil UE support, x 15 each.  Therapeutic activity of kicking cones with RLE focusing on hamstring curl, x 10 including r sidestepping with RW.  Pt DOE of kicking and required seated rest break.  Pt reported she didn't; each much breakfast.  PT educated pt on importance of eating well during rehab; she is still slightly anemic.  Pt left resting in recliner with L knee ACE wrapped and all needs within reach. Pt will request some of her personal ice packs during break between therapies.   See function navigator for current mobility.   Goebel Hellums 07/12/2016, 7:49 AM

## 2016-07-12 NOTE — Progress Notes (Signed)
Occupational Therapy Session Note  Patient Details  Name: [name] MRN: [id number] Date of Birth: [birth year]  Today's Date: 07/12/2016 OT Individual Time: 1131-1203 OT Individual Time Calculation (min): 32 min     Short Term Goals: Week 1:  OT Short Term Goal 1 (Week 1): STG=LTG  Skilled Therapeutic Interventions/Progress Updates:    Pt completed functional mobility to and from the ADL apartment with supervision after donning shoes with setup.  Increased speed noted with use of walker during mobility.  She was able to work on sit to stand transitions from lower bed and lower couch with close supervision during session.  Reviewed verbally kitchen setup and performance of simple meal heat up and transport during the day.  Notified nursing of pt's headache and knee pain, which was greater in the left.  Returned to room at end of session with call button and phone in reach.   Therapy Documentation Precautions:  Precautions Precautions: Knee, Fall Required Braces or Orthoses: Knee Immobilizer - Left Knee Immobilizer - Left: Discontinue once straight leg raise with < 10 degree lag Restrictions Weight Bearing Restrictions: No RLE Weight Bearing: Weight bearing as tolerated LLE Weight Bearing: Weight bearing as tolerated  Pain: Pain Assessment Pain Assessment: Faces Faces Pain Scale: Hurts little more Pain Type: Surgical pain Pain Location: Knee Pain Orientation: Right;Left Pain Intervention(s): Repositioned;Medication (See eMAR);RN made aware ADL: See Function Navigator for Current Functional Status.   Therapy/Group: Individual Therapy  Abelardo Seidner OTR/L 07/12/2016, 12:22 PM

## 2016-07-12 NOTE — Progress Notes (Signed)
Occupational Therapy Session Note  Patient Details  Name: [name] MRN: [id number] Date of Birth: [birth year]  Today's Date: 07/12/2016 OT Individual Time: 1301-1402 OT Individual Time Calculation (min): 61 min      Skilled Therapeutic Interventions/Progress Updates:    Pt completed functional mobility to the gym with use of the RW and supervision.  Worked on sit to stand transitions and static/dynamic standing balance while engaged in Wii activity.  Pt able to stand with close supervision unsupported while engaged in activity.  Progressed to weightshifts and small steps forward and side to side with min guard assist for safety as well.  Still needs use of UEs for sit to stand transition as she does not have enough flexion in her knees to complete without UE compensation.  Able to tolerate standing activity for 10 min intervals with HR increasing to 116 and O2 sats at 94% or above.  Returned to room with pt being placed in bedside recliner with call button and phone in reach.   Therapy Documentation Precautions:  Precautions Precautions: Knee, Fall Precaution Comments: KI discharged Required Braces or Orthoses: Knee Immobilizer - Left Knee Immobilizer - Left: Discontinue once straight leg raise with < 10 degree lag Restrictions Weight Bearing Restrictions: No RLE Weight Bearing: Weight bearing as tolerated LLE Weight Bearing: Weight bearing as tolerated  Pain: Pain Assessment Pain Assessment: Faces Faces Pain Scale: Hurts a little bit Pain Type: Surgical pain Pain Location: Knee Pain Orientation: Right;Left Pain Descriptors / Indicators: Discomfort Pain Intervention(s): Repositioned ADL: See Function Navigator for Current Functional Status.   Therapy/Group: Individual Therapy  Eliyah Bazzi OTR/L 07/12/2016, 2:22 PM

## 2016-07-12 NOTE — Progress Notes (Signed)
Subjective/Complaints: No issues overnite  ROS- neg CP , SOB, N/V/D  Objective: Vital Signs: Blood pressure 120/62, pulse 74, temperature 97.9 F (36.6 C), temperature source Oral, resp. rate 20, height 5' 7"  (1.702 m), weight 74 kg (163 lb 1.6 oz), SpO2 100 %. Dg Knee 1-2 Views Left  Result Date: 07/11/2016 CLINICAL DATA:  Status post left knee replacement EXAM: LEFT KNEE - 1-2 VIEW COMPARISON:  None. FINDINGS: Left knee prosthesis is noted. A surgical staple is noted along the lateral aspect of the proximal tibia. No acute bony abnormality is seen. No soft tissue changes are noted. IMPRESSION: Status post left knee replacement Electronically Signed   By: Inez Catalina M.D.   On: 07/11/2016 20:02   Results for orders placed or performed during the hospital encounter of 07/07/16 (from the past 72 hour(s))  CBC WITH DIFFERENTIAL     Status: Abnormal   Collection Time: 07/10/16  5:19 AM  Result Value Ref Range   WBC 5.6 4.0 - 10.5 K/uL   RBC 3.18 (L) 3.87 - 5.11 MIL/uL   Hemoglobin 9.8 (L) 12.0 - 15.0 g/dL   HCT 29.3 (L) 36.0 - 46.0 %   MCV 92.1 78.0 - 100.0 fL   MCH 30.8 26.0 - 34.0 pg   MCHC 33.4 30.0 - 36.0 g/dL   RDW 13.7 11.5 - 15.5 %   Platelets 260 150 - 400 K/uL   Neutrophils Relative % 53 %   Neutro Abs 3.0 1.7 - 7.7 K/uL   Lymphocytes Relative 27 %   Lymphs Abs 1.5 0.7 - 4.0 K/uL   Monocytes Relative 16 %   Monocytes Absolute 0.9 0.1 - 1.0 K/uL   Eosinophils Relative 3 %   Eosinophils Absolute 0.2 0.0 - 0.7 K/uL   Basophils Relative 1 %   Basophils Absolute 0.1 0.0 - 0.1 K/uL  Comprehensive metabolic panel     Status: Abnormal   Collection Time: 07/10/16  5:19 AM  Result Value Ref Range   Sodium 138 135 - 145 mmol/L   Potassium 4.4 3.5 - 5.1 mmol/L   Chloride 105 101 - 111 mmol/L   CO2 27 22 - 32 mmol/L   Glucose, Bld 107 (H) 65 - 99 mg/dL   BUN 12 6 - 20 mg/dL   Creatinine, Ser 0.72 0.44 - 1.00 mg/dL   Calcium 8.7 (L) 8.9 - 10.3 mg/dL   Total Protein 5.4 (L)  6.5 - 8.1 g/dL   Albumin 2.8 (L) 3.5 - 5.0 g/dL   AST 100 (H) 15 - 41 U/L   ALT 143 (H) 14 - 54 U/L   Alkaline Phosphatase 265 (H) 38 - 126 U/L   Total Bilirubin 1.0 0.3 - 1.2 mg/dL   GFR calc non Af Amer >60 >60 mL/min   GFR calc Af Amer >60 >60 mL/min    Comment: (NOTE) The eGFR has been calculated using the CKD EPI equation. This calculation has not been validated in all clinical situations. eGFR's persistently <60 mL/min signify possible Chronic Kidney Disease.    Anion gap 6 5 - 15      General: No acute distress Mood and affect are appropriate Heart: Regular rate and rhythm no rubs murmurs or extra sounds Lungs: Clear to auscultation, breathing unlabored, no rales or wheezes Abdomen: Positive bowel sounds, soft nontender to palpation, nondistended Extremities: No clubbing, cyanosis, or edema Skin: No evidence of breakdown, no evidence of rash Neurologic: Cranial nerves II through XII intact, motor strength is 5/5 in bilateral deltoid,  bicep, tricep, grip,4- hip flexor, knee extensors, 4/5 ankle dorsiflexor and plantar flexor Sensory exam normal sensation to light touch lower extremities  Musculoskeletal: Full range of motion in all 4 extremities. No joint swelling   Assessment/Plan: 1. Functional deficits secondary to B TKR for end stage OA which require 3+ hours per day of interdisciplinary therapy in a comprehensive inpatient rehab setting. Physiatrist is providing close team supervision and 24 hour management of active medical problems listed below. Physiatrist and rehab team continue to assess barriers to discharge/monitor patient progress toward functional and medical goals. FIM: Function - Bathing Position: Shower Body parts bathed by patient: Right arm, Left arm, Chest, Abdomen, Front perineal area, Buttocks, Right upper leg, Left upper leg, Right lower leg, Left lower leg, Back Body parts bathed by helper: Right lower leg, Left lower leg, Back Assist Level:  Supervision or verbal cues  Function- Upper Body Dressing/Undressing What is the patient wearing?: Pull over shirt/dress Pull over shirt/dress - Perfomed by patient: Thread/unthread right sleeve, Thread/unthread left sleeve, Put head through opening, Pull shirt over trunk Assist Level: Set up Set up : To obtain clothing/put away Function - Lower Body Dressing/Undressing What is the patient wearing?: Underwear, Pants, Non-skid slipper socks, Ted Hose Position: Sitting EOB Underwear - Performed by patient: Thread/unthread left underwear leg, Thread/unthread right underwear leg, Pull underwear up/down Underwear - Performed by helper: Thread/unthread left underwear leg, Thread/unthread right underwear leg Pants- Performed by patient: Thread/unthread right pants leg, Thread/unthread left pants leg, Pull pants up/down, Fasten/unfasten pants Pants- Performed by helper: Thread/unthread left pants leg, Thread/unthread right pants leg Non-skid slipper socks- Performed by helper: Don/doff right sock, Don/doff left sock Shoes - Performed by helper: Don/doff right shoe, Don/doff left shoe, Fasten right, Fasten left TED Hose - Performed by helper: Don/doff right TED hose, Don/doff left TED hose Assist for footwear: Maximal assist Assist for lower body dressing: Touching or steadying assistance (Pt > 75%)  Function - Toileting Toileting steps completed by patient: Adjust clothing prior to toileting, Performs perineal hygiene, Adjust clothing after toileting Toileting Assistive Devices: Grab bar or rail Assist level: Supervision or verbal cues  Function - Air cabin crew transfer assistive device: Walker, Elevated toilet seat/BSC over toilet Assist level to toilet: Supervision or verbal cues Assist level from toilet: Touching or steadying assistance (Pt > 75%)  Function - Chair/bed transfer Chair/bed transfer method: Ambulatory Chair/bed transfer assist level: Supervision or verbal  cues Chair/bed transfer assistive device: Environmental consultant, Armrests Chair/bed transfer details: Verbal cues for technique  Function - Locomotion: Wheelchair Will patient use wheelchair at discharge?: No Type: Manual Max wheelchair distance: 147f  Assist Level: Supervision or verbal cues Assist Level: Supervision or verbal cues Assist Level: Supervision or verbal cues Function - Locomotion: Ambulation Assistive device: Walker-rolling Max distance: 125 ft Assist level: Supervision or verbal cues Assist level: Supervision or verbal cues Assist level: Supervision or verbal cues Assist level: Supervision or verbal cues Assist level: Touching or steadying assistance (Pt > 75%)  Function - Comprehension Comprehension: Auditory Comprehension assist level: Follows complex conversation/direction with no assist  Function - Expression Expression: Verbal Expression assist level: Expresses complex ideas: With no assist  Function - Social Interaction Social Interaction assist level: Interacts appropriately with others - No medications needed.  Function - Problem Solving Problem solving assist level: Solves complex problems: Recognizes & self-corrects  Function - Memory Memory assist level: Complete Independence: No helper Patient normally able to recall (first 3 days only): That he or she is in a  hospital, Staff names and faces, Current season Medical Problem List and Plan: 1.  Decreased functional mobility secondary to bilateral TKA 07/03/2016. Weightbearing as tolerated CIR PT, OT, Team conference today please see physician documentation under team conference tab, met with team face-to-face to discuss problems,progress, and goals. Formulized individual treatment plan based on medical history, underlying problem and comorbidities. 2.  DVT Prophylaxis/Anticoagulation: Xarelto. Normal vascular studies 3. Pain Management: Celebrex 200 mg twice a day. Oxycodone and Robaxin as needed 4. Acute blood  loss anemia. Continue iron supplement. Follow-up CBC- no changes CBC Latest Ref Rng & Units 07/10/2016 07/07/2016 07/06/2016  WBC 4.0 - 10.5 K/uL 5.6 6.2 6.5  Hemoglobin 12.0 - 15.0 g/dL 9.8(L) 9.7(L) 7.1(L)  Hematocrit 36.0 - 46.0 % 29.3(L) 28.0(L) 21.3(L)  Platelets 150 - 400 K/uL 260 178 146(L)   5. Neuropsych: This patient is capable of making decisions on her own behalf. 6. Skin/Wound Care: Routine skin checks 7. Fluids/Electrolytes/Nutrition: Routine I&O with follow-up chemistries 8. Constipation. Laxative assistance 9.  Hypoalb- prostat   LOS (Days) 5 A FACE TO FACE EVALUATION WAS PERFORMED  KIRSTEINS,ANDREW E 07/12/2016, 7:28 AM

## 2016-07-12 NOTE — Progress Notes (Signed)
Social Work Patient ID: [name], female   DOB: 02/02/1951, 65 y.o.   MRN: [id number]  Met with pt to inform her of team conference goals mod/i level and discharge 11/11. Made aware her left knee is more swollen And she is having issues with it. MD did x-ray it and is showed nothing. Surgeon to come by and see her tonight. She is having her husband come in for PT today so he can be trained. She does want to go to [facility] For her OP therapies. Will contact to get first appointment. Work toward discharge Sat.

## 2016-07-12 NOTE — Patient Care Conference (Signed)
Inpatient RehabilitationTeam Conference and Plan of Care Update Date: 07/12/2016   Time: 10:30 AM    Patient Name: [name]      Medical Record Number: [id number]  Date of Birth: [birth year] Sex: Female         Room/Bed: 4M04C/4M04C-01 Payor Info: Payor: BLUE CROSS BLUE SHIELD / Plan: BCBS OTHER / Product Type: *No Product type* /    Admitting Diagnosis: B TKR  Admit Date/Time:  07/07/2016 12:19 PM Admission Comments: No comment available   Primary Diagnosis:  <principal problem not specified> Principal Problem: <principal problem not specified>  Patient Active Problem List   Diagnosis Date Noted  . 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 Constipation due to pain medication   . Post-operative pain   . Abnormality of gait   . Acute blood loss anemia 07/06/2016  . S/P bilateral TKAs 07/03/2016    Expected Discharge Date: Expected Discharge Date: 07/15/16  Team Members Present: Physician leading conference: Dr. Alysia Penna Social Worker Present: Amanda Kin, LCSW Nurse Present: Amanda Roberts, RN PT Present: [name], PT;[name], [name], PT OT Present: [name], OT SLP Present: [name], SLP PPS Coordinator present : Amanda Nakayama, RN, CRRN     Current Status/Progress Goal Weekly Team Focus  Medical   Some increased swelling Left knee, some loss of   home with mod I  D/C planning   Bowel/Bladder   Continent of bowel and bladder  Remain continent of bowel and bladder  Assist with tolietin needs   Swallow/Nutrition/ Hydration             ADL's   supervision overall, min A for LB dressing  mod I overall, supervision for shower transfer and homemaking  activity tolerance, discharge planning, family education, safety awareness   Mobility   S overall; increased L knee pain in yesterday limited knee flexion  modified independent overall except supervision for 1+1 steps to enter house with RW, and 12 steps with 2 rails  pt and  family ed, ROM bil knees, HEP, mobility and locomotion   Communication             Safety/Cognition/ Behavioral Observations            Pain   pain managed with prn oxy and rob  Pain < or = 4   Assess pain q shift and prn    Skin   Bilateral knee incision with surgical dressing in place, minimal amount of old drainage noted bilateral  No new skin breakdown/injury  Assess skin q shift and prn      *See Care Plan and progress notes for long and short-term goals.  Barriers to Discharge: still needs to train with stairs, fam ed    Possible Resolutions to Barriers:  Cont rehab    Discharge Planning/Teaching Needs:  Home with husband who plans to take some time off to make transition from hospital to home smooth. Pt hopes to go home by this weekend      Team Discussion:  Meeting also of mod/i-bad day yesterday with left knee more swollen and MD did x-ray. Surgeon to come by later today to check it, due to not moving as well. More pain in left knee also. Ace wrapping now. Husband coming in for education. Pt planning on OP therapy.  Revisions to Treatment Plan:  DC 11/11 if medically stable   Continued Need for Acute Rehabilitation Level of Care: The patient requires daily medical management by a physician  with specialized training in physical medicine and rehabilitation for the following conditions: Daily direction of a multidisciplinary physical rehabilitation program to ensure safe treatment while eliciting the highest outcome that is of practical value to the patient.: Yes Daily medical management of patient stability for increased activity during participation in an intensive rehabilitation regime.: Yes Daily analysis of laboratory values and/or radiology reports with any subsequent need for medication adjustment of medical intervention for : Post surgical problems;Other  [name], [name] 07/12/2016, 1:31 PM

## 2016-07-12 NOTE — Progress Notes (Signed)
Physical Therapy Session Note  Patient Details  Name: [name] MRN: [id number] Date of Birth: 1951/04/01  Today's Date: 07/12/2016 PT Individual Time: 1600-1630 (make up time from 11/7) PT Individual Time Calculation (min): 30 min    Short Term Goals: Week 1:  PT Short Term Goal 1 (Week 1): STG =LTG due to Estimated Length of Stay  Skilled Therapeutic Interventions/Progress Updates:     Patient received sitting in [facility] and agreeable to PT. PT noted significant edema proximal to Ted hose and at knee joint line. PT re-wrapped knee with ace wrap and educated on proper wear.  PT instructed patient in ascent and descent of single step with RW for access into home with supervision Assist x 6. PT instructed patient in forward descent and backward descent for improved safety if knee pain in increased.   Gait training instructed by PT with RW for 124ft x 3 and 5ft with LBQC. PT provided min-mod cues for 3 point gait pattern with [facility] and min cues for increased knee flexion with gait with RW. Patient returned to room and left sitting in recliner with call bell in reach following treatment.   Therapy Documentation Precautions:  Precautions Precautions: Knee, Fall Precaution Comments: KI discharged Required Braces or Orthoses: Knee Immobilizer - Left Knee Immobilizer - Left: Discontinue once straight leg raise with < 10 degree lag Restrictions Weight Bearing Restrictions: No RLE Weight Bearing: Weight bearing as tolerated LLE Weight Bearing: Weight bearing as tolerated General:   Vital Signs: Therapy Vitals Temp: 98 F (36.7 C) Temp Source: Oral Pulse Rate: 84 Resp: 18 BP: 112/61 Patient Position (if appropriate): Sitting Oxygen Therapy SpO2: 100 % O2 Device: Not Delivered Pain: Pain Assessment Pain Assessment: 0-10 Pain Score: 4    See Function Navigator for Current Functional Status.   Therapy/Group: Individual Therapy  Lorie Phenix 07/12/2016, 6:10 PM

## 2016-07-13 ENCOUNTER — Inpatient Hospital Stay (HOSPITAL_COMMUNITY): Payer: BLUE CROSS/BLUE SHIELD | Admitting: Physical Therapy

## 2016-07-13 ENCOUNTER — Inpatient Hospital Stay (HOSPITAL_COMMUNITY): Payer: BLUE CROSS/BLUE SHIELD | Admitting: Occupational Therapy

## 2016-07-13 NOTE — Progress Notes (Signed)
Subjective/Complaints: No issues in therapy, had some leg edema which improved with elevation and ace  ROS- neg CP , SOB, N/V/D  Objective: Vital Signs: Blood pressure (!) 104/58, pulse 84, temperature 97.8 F (36.6 C), temperature source Oral, resp. rate 18, height 5\' 7"  (1.702 m), weight 71.9 kg (158 lb 8 oz), SpO2 100 %. Dg Knee 1-2 Views Left  Result Date: 07/11/2016 CLINICAL DATA:  Status post left knee replacement EXAM: LEFT KNEE - 1-2 VIEW COMPARISON:  None. FINDINGS: Left knee prosthesis is noted. A surgical staple is noted along the lateral aspect of the proximal tibia. No acute bony abnormality is seen. No soft tissue changes are noted. IMPRESSION: Status post left knee replacement Electronically Signed   By: Inez Catalina M.D.   On: 07/11/2016 20:02   Results for orders placed or performed during the hospital encounter of 07/07/16 (from the past 72 hour(s))  Urinalysis, Routine w reflex microscopic (not at [facility])     Status: Abnormal   Collection Time: 07/12/16 10:21 PM  Result Value Ref Range   Color, Urine AMBER (A) YELLOW    Comment: BIOCHEMICALS MAY BE AFFECTED BY COLOR   APPearance CLOUDY (A) CLEAR   Specific Gravity, Urine 1.026 1.005 - 1.030   pH 5.5 5.0 - 8.0   Glucose, UA NEGATIVE NEGATIVE mg/dL   Hgb urine dipstick SMALL (A) NEGATIVE   Bilirubin Urine NEGATIVE NEGATIVE   Ketones, ur NEGATIVE NEGATIVE mg/dL   Protein, ur NEGATIVE NEGATIVE mg/dL   Nitrite NEGATIVE NEGATIVE   Leukocytes, UA LARGE (A) NEGATIVE  Urine microscopic-add on     Status: Abnormal   Collection Time: 07/12/16 10:21 PM  Result Value Ref Range   Squamous Epithelial / LPF 0-5 (A) NONE SEEN   WBC, UA TOO NUMEROUS TO COUNT 0 - 5 WBC/hpf   RBC / HPF 0-5 0 - 5 RBC/hpf   Bacteria, UA FEW (A) NONE SEEN      General: No acute distress Mood and affect are appropriate Heart: Regular rate and rhythm no rubs murmurs or extra sounds Lungs: Clear to auscultation, breathing unlabored, no rales or  wheezes Abdomen: Positive bowel sounds, soft nontender to palpation, nondistended Extremities: No clubbing, cyanosis, or edema Skin: No evidence of breakdown, no evidence of rash Neurologic: Cranial nerves II through XII intact, motor strength is 5/5 in bilateral deltoid, bicep, tricep, grip,4- hip flexor, knee extensors, 4/5 ankle dorsiflexor and plantar flexor Sensory exam normal sensation to light touch lower extremities  Musculoskeletal: Full range of motion in all 4 extremities. No joint swelling   Assessment/Plan: 1. Functional deficits secondary to B TKR for end stage OA which require 3+ hours per day of interdisciplinary therapy in a comprehensive inpatient rehab setting. Physiatrist is providing close team supervision and 24 hour management of active medical problems listed below. Physiatrist and rehab team continue to assess barriers to discharge/monitor patient progress toward functional and medical goals. FIM: Function - Bathing Position: Shower Body parts bathed by patient: Right arm, Left arm, Chest, Abdomen, Front perineal area, Buttocks, Right upper leg, Left upper leg, Right lower leg, Left lower leg, Back Body parts bathed by helper: Right lower leg, Left lower leg, Back Assist Level: Supervision or verbal cues  Function- Upper Body Dressing/Undressing What is the patient wearing?: Pull over shirt/dress Pull over shirt/dress - Perfomed by patient: Thread/unthread right sleeve, Thread/unthread left sleeve, Put head through opening, Pull shirt over trunk Assist Level: Set up Set up : To obtain clothing/put away Function -  Lower Body Dressing/Undressing What is the patient wearing?: Underwear, Pants, Non-skid slipper socks, Ted Hose Position: Sitting EOB Underwear - Performed by patient: Thread/unthread left underwear leg, Thread/unthread right underwear leg, Pull underwear up/down Underwear - Performed by helper: Thread/unthread left underwear leg, Thread/unthread right  underwear leg Pants- Performed by patient: Thread/unthread right pants leg, Thread/unthread left pants leg, Pull pants up/down, Fasten/unfasten pants Pants- Performed by helper: Thread/unthread left pants leg, Thread/unthread right pants leg Non-skid slipper socks- Performed by helper: Don/doff right sock, Don/doff left sock Shoes - Performed by helper: Don/doff right shoe, Don/doff left shoe, Fasten right, Fasten left TED Hose - Performed by helper: Don/doff right TED hose, Don/doff left TED hose Assist for footwear: Maximal assist Assist for lower body dressing: Touching or steadying assistance (Pt > 75%)  Function - Toileting Toileting steps completed by patient: Adjust clothing prior to toileting, Performs perineal hygiene, Adjust clothing after toileting Toileting Assistive Devices: Grab bar or rail Assist level: Supervision or verbal cues  Function - Air cabin crew transfer assistive device: Walker, Elevated toilet seat/BSC over toilet Assist level to toilet: Supervision or verbal cues Assist level from toilet: Supervision or verbal cues  Function - Chair/bed transfer Chair/bed transfer method: Ambulatory Chair/bed transfer assist level: Supervision or verbal cues Chair/bed transfer assistive device: Environmental consultant, Armrests Chair/bed transfer details: Verbal cues for technique  Function - Locomotion: Wheelchair Will patient use wheelchair at discharge?: No Type: Manual Max wheelchair distance: 164ft  Assist Level: Supervision or verbal cues Assist Level: Supervision or verbal cues Assist Level: Supervision or verbal cues Function - Locomotion: Ambulation Assistive device: Walker-rolling Max distance: 100 Assist level: Supervision or verbal cues Assist level: Supervision or verbal cues Assist level: Supervision or verbal cues Assist level: Supervision or verbal cues Assist level: Touching or steadying assistance (Pt > 75%)  Function - Comprehension Comprehension:  Auditory Comprehension assist level: Follows complex conversation/direction with no assist  Function - Expression Expression: Verbal Expression assist level: Expresses complex ideas: With no assist  Function - Social Interaction Social Interaction assist level: Interacts appropriately with others - No medications needed.  Function - Problem Solving Problem solving assist level: Solves complex problems: Recognizes & self-corrects  Function - Memory Memory assist level: Complete Independence: No helper Patient normally able to recall (first 3 days only): That he or she is in a hospital, Staff names and faces, Current season Medical Problem List and Plan: 1.  Decreased functional mobility secondary to bilateral TKA 07/03/2016. Weightbearing as tolerated CIR PT, OT, 2.  DVT Prophylaxis/Anticoagulation: Xarelto. Normal vascular studies 3. Pain Management: Celebrex 200 mg twice a day. Oxycodone and Robaxin as needed 4. Acute blood loss anemia. Continue iron supplement. Follow-up CBC- no changes CBC Latest Ref Rng & Units 07/10/2016 07/07/2016 07/06/2016  WBC 4.0 - 10.5 K/uL 5.6 6.2 6.5  Hemoglobin 12.0 - 15.0 g/dL 9.8(L) 9.7(L) 7.1(L)  Hematocrit 36.0 - 46.0 % 29.3(L) 28.0(L) 21.3(L)  Platelets 150 - 400 K/uL 260 178 146(L)   5. Neuropsych: This patient is capable of making decisions on her own behalf. 6. Skin/Wound Care: Routine skin checks 7. Fluids/Electrolytes/Nutrition: Routine I&O with follow-up chemistries 8. Constipation. Laxative assistance 9.  Hypoalb- prostat 10.  Post op dependant edema, vascular studies neg 11/5, cont elevation and ace  LOS (Days) 6 A FACE TO FACE EVALUATION WAS PERFORMED  Akhilesh Sassone E 07/13/2016, 7:24 AM

## 2016-07-13 NOTE — Progress Notes (Signed)
Social Work Patient ID: [name], female   DOB: 05/18/1951, 65 y.o.   MRN: [id number]  Met with pt to discuss OP therapies, have contacted [facility] to try to schedule appointment. Their process is she will need to be seen by the MD, which she has an appointment for 11/15, at that time the MD will write a script for OPPT and then be scheduled. Have informed pt of this And she will follow up with the surgeon's office.  

## 2016-07-13 NOTE — Progress Notes (Signed)
Physical Therapy Session Note  Patient Details  Name: [name] MRN: [id number] Date of Birth: 05/07/1951  Today's Date: 07/13/2016 PT Individual Time: 1000-1100 AND 1500-1615 PT Individual Time Calculation (min): 60 min AND 75 min    Short Term Goals:Week 1:  PT Short Term Goal 1 (Week 1): STG =LTG due to Estimated Length of Stay  Skilled Therapeutic Interventions/Progress Updates:   Patient received sitting in WC and agreeable PT.   Gait in hall x 150 with RW and supervision Assist from PT. Min cues for improved knee flexion with gait.  PT Transported to gift shop in WC for time mangement. Gait through gift shop with RW and supervision Assist, min cues for improved knee flexion and problem solving to negotiate RW in tight spaces. PT instructed pt in Gait through food court with RW x 250ft and supervision Assist. Min cues for decreased pressure through BUE on RW to prevent veering of RW in cracks of time. .  Sit<>stand x 5 from couch with mod cues for decreased speed increased knee flexion.  PT instructed patient in additional gait over various surfaces including wood tile, carpet, and seamless tile with RW and min cues for AD management with transition.   Seated therex knee fleixon/extension with max knee flexion on the LLE 66 degrees and on the R LE 78 degrees.   Pt returned to room and left sitting in recliner with call bell in reach   Session 2:   Patient received sitting in recliner and agreeable to PT. Patient transfers to WC with ambulatory transfer with RW and supervision Assist with cues for safety to remain in RW.  WC mobility through hall in hospital x 200ft, and 150ft with supervision Assist to prevent hitting RLE on doorframes. PT adjusted  Pt's personal RW to improve stability and reduce friction from rubber feet.  Gait training over unlevel cement and paved sidewalk x 300ft with supervision Assist with min cues for awareness of unlevel surface with decreased  stability of RW.  PT instructed patient in HEP with resistance band LAQ, HS curls, hip abduction hip fleixon with cues to remain in pain free ROM.  Gait training in SPC x 260ft with min assist from PT and mod cues for proper 2 point, reciprocal gait pattern. Patient able to maintain 50% of the time.   Patient returned to room and left sitting in recliner with call bell in reach and all needs met.      Therapy Documentation Precautions:  Precautions Precautions: Knee, Fall Precaution Comments: KI discharged Required Braces or Orthoses: Knee Immobilizer - Left Knee Immobilizer - Left: Discontinue once straight leg raise with < 10 degree lag Restrictions Weight Bearing Restrictions: Yes RLE Weight Bearing: Weight bearing as tolerated LLE Weight Bearing: Weight bearing as tolerated General:   Vital Signs: Therapy Vitals Temp: 97.8 F (36.6 C) Temp Source: Oral Pulse Rate: 84 Resp: 18 BP: (!) 104/58 Patient Position (if appropriate): Sitting Oxygen Therapy SpO2: 100 % O2 Device: Not Delivered   See Function Navigator for Current Functional Status.   Therapy/Group: Individual Therapy   E  07/13/2016, 7:59 AM  

## 2016-07-13 NOTE — Progress Notes (Signed)
Occupational Therapy Session Note  Patient Details  Name: [name] MRN: [id number] Date of Birth: [birth year]  Today's Date: 07/13/2016 OT Individual Time: [id number] OT Individual Time Calculation (min): 59 min     Short Term Goals: Week 1:  OT Short Term Goal 1 (Week 1): STG=LTG  Skilled Therapeutic Interventions/Progress Updates:    Pt completed donning TEDs and shoes in recliner with foot rest elevated.  Next ambulated to the therapy gym with supervision using the RW.  Utilized Nustep on level 4 resistance with average number of steps for 10 mins between 48-52.  Last 3 mins spent with slow movements emphasizing stretching of knee into flexion.  Once finished had pt ambulate to gather ice packs with education on proper positioning of the RW while reaching into the freezer.  Discussed home management task of making up a bed with use of the RW as well but did not practice this session. Pt left in recliner at end of session with call button and phone in reach.   Therapy Documentation Precautions:  Precautions Precautions: Knee, Fall Precaution Comments: KI discharged Required Braces or Orthoses: Knee Immobilizer - Left Knee Immobilizer - Left: Discontinue once straight leg raise with < 10 degree lag Restrictions Weight Bearing Restrictions: Yes RLE Weight Bearing: Weight bearing as tolerated LLE Weight Bearing: Weight bearing as tolerated  Pain: Pain Assessment Pain Assessment: Faces Pain Score: 3  Faces Pain Scale: Hurts a little bit Pain Type: Surgical pain Pain Location: Knee Pain Orientation: Right;Left Pain Descriptors / Indicators: Discomfort Pain Intervention(s): Repositioned ADL:  See Function Navigator for Current Functional Status.   Therapy/Group: Individual Therapy  [name] OTR/L 07/13/2016, 12:22 PM

## 2016-07-14 ENCOUNTER — Inpatient Hospital Stay (HOSPITAL_COMMUNITY): Payer: BLUE CROSS/BLUE SHIELD | Admitting: Physical Therapy

## 2016-07-14 ENCOUNTER — Inpatient Hospital Stay (HOSPITAL_COMMUNITY): Payer: BLUE CROSS/BLUE SHIELD | Admitting: Occupational Therapy

## 2016-07-14 MED ORDER — CELECOXIB 200 MG PO CAPS: 200.0000 mg | ORAL_CAPSULE | Freq: Two times a day (BID) | ORAL | 0 refills | Status: DC

## 2016-07-14 MED ORDER — FERROUS SULFATE 325 (65 FE) MG PO TABS: 325.0000 mg | ORAL_TABLET | Freq: Three times a day (TID) | ORAL | 3 refills | Status: DC

## 2016-07-14 MED ORDER — RIVAROXABAN 10 MG PO TABS: 10.0000 mg | ORAL_TABLET | ORAL | 0 refills | Status: DC

## 2016-07-14 MED ORDER — OXYCODONE HCL 10 MG PO TABS: 10.0000 mg | ORAL_TABLET | ORAL | 0 refills | Status: DC | PRN

## 2016-07-14 MED ORDER — METHOCARBAMOL 500 MG PO TABS: 500.0000 mg | ORAL_TABLET | Freq: Four times a day (QID) | ORAL | 0 refills | Status: DC | PRN

## 2016-07-14 NOTE — Progress Notes (Signed)
Subjective/Complaints: Less dysuria, discussed mixed result of Urinalysis   ROS- neg CP , SOB, N/V/D  Objective: Vital Signs: Blood pressure 131/70, pulse 80, temperature 98.1 F (36.7 C), temperature source Oral, resp. rate 17, height 5\' 7"  (1.702 m), weight 71.9 kg (158 lb 8 oz), SpO2 98 %. No results found. Results for orders placed or performed during the hospital encounter of 07/07/16 (from the past 72 hour(s))  Urinalysis, Routine w reflex microscopic (not at [facility])     Status: Abnormal   Collection Time: 07/12/16 10:21 PM  Result Value Ref Range   Color, Urine AMBER (A) YELLOW    Comment: BIOCHEMICALS MAY BE AFFECTED BY COLOR   APPearance CLOUDY (A) CLEAR   Specific Gravity, Urine 1.026 1.005 - 1.030   pH 5.5 5.0 - 8.0   Glucose, UA NEGATIVE NEGATIVE mg/dL   Hgb urine dipstick SMALL (A) NEGATIVE   Bilirubin Urine NEGATIVE NEGATIVE   Ketones, ur NEGATIVE NEGATIVE mg/dL   Protein, ur NEGATIVE NEGATIVE mg/dL   Nitrite NEGATIVE NEGATIVE   Leukocytes, UA LARGE (A) NEGATIVE  Urine microscopic-add on     Status: Abnormal   Collection Time: 07/12/16 10:21 PM  Result Value Ref Range   Squamous Epithelial / LPF 0-5 (A) NONE SEEN   WBC, UA TOO NUMEROUS TO COUNT 0 - 5 WBC/hpf   RBC / HPF 0-5 0 - 5 RBC/hpf   Bacteria, UA FEW (A) NONE SEEN      General: No acute distress Mood and affect are appropriate Heart: Regular rate and rhythm no rubs murmurs or extra sounds Lungs: Clear to auscultation, breathing unlabored, no rales or wheezes Abdomen: Positive bowel sounds, soft nontender to palpation, nondistended Extremities: No clubbing, cyanosis, or edema Skin: No evidence of breakdown, no evidence of rash Neurologic: Cranial nerves II through XII intact, motor strength is 5/5 in bilateral deltoid, bicep, tricep, grip,4- hip flexor, knee extensors, 4/5 ankle dorsiflexor and plantar flexor Sensory exam normal sensation to light touch lower extremities  Musculoskeletal: Full range  of motion in all 4 extremities. No joint swelling   Assessment/Plan: 1. Functional deficits secondary to B TKR for end stage OA which require 3+ hours per day of interdisciplinary therapy in a comprehensive inpatient rehab setting. Physiatrist is providing close team supervision and 24 hour management of active medical problems listed below. Physiatrist and rehab team continue to assess barriers to discharge/monitor patient progress toward functional and medical goals. FIM: Function - Bathing Position: Shower Body parts bathed by patient: Right arm, Left arm, Chest, Abdomen, Front perineal area, Buttocks, Right upper leg, Left upper leg, Right lower leg, Left lower leg, Back Body parts bathed by helper: Right lower leg, Left lower leg, Back Assist Level: Supervision or verbal cues  Function- Upper Body Dressing/Undressing What is the patient wearing?: Pull over shirt/dress Pull over shirt/dress - Perfomed by patient: Thread/unthread right sleeve, Thread/unthread left sleeve, Put head through opening, Pull shirt over trunk Assist Level: Set up Set up : To obtain clothing/put away Function - Lower Body Dressing/Undressing What is the patient wearing?: Shoes, Ted Hose Position: Sitting EOB Underwear - Performed by patient: Thread/unthread left underwear leg, Thread/unthread right underwear leg, Pull underwear up/down Underwear - Performed by helper: Thread/unthread left underwear leg, Thread/unthread right underwear leg Pants- Performed by patient: Thread/unthread right pants leg, Thread/unthread left pants leg, Pull pants up/down, Fasten/unfasten pants Pants- Performed by helper: Thread/unthread left pants leg, Thread/unthread right pants leg Non-skid slipper socks- Performed by helper: Don/doff right sock, Don/doff left  sock Shoes - Performed by helper: Don/doff right shoe, Don/doff left shoe, Fasten right, Fasten left TED Hose - Performed by patient: Don/doff right TED hose, Don/doff left  TED hose TED Hose - Performed by helper: Don/doff right TED hose, Don/doff left TED hose Assist for footwear: Maximal assist Assist for lower body dressing: Touching or steadying assistance (Pt > 75%)  Function - Toileting Toileting steps completed by patient: Adjust clothing prior to toileting, Performs perineal hygiene, Adjust clothing after toileting Toileting Assistive Devices: Grab bar or rail Assist level: Supervision or verbal cues  Function - Air cabin crew transfer assistive device: Walker, Elevated toilet seat/BSC over toilet Assist level to toilet: Supervision or verbal cues Assist level from toilet: Supervision or verbal cues  Function - Chair/bed transfer Chair/bed transfer method: Ambulatory Chair/bed transfer assist level: Supervision or verbal cues Chair/bed transfer assistive device: Walker Chair/bed transfer details: Verbal cues for technique  Function - Locomotion: Wheelchair Will patient use wheelchair at discharge?: No Type: Manual Max wheelchair distance: 264ft  Assist Level: Supervision or verbal cues Assist Level: Supervision or verbal cues Assist Level: Supervision or verbal cues Function - Locomotion: Ambulation Assistive device: Walker-rolling Max distance: 331ft  Assist level: Supervision or verbal cues Assist level: Supervision or verbal cues Assist level: Supervision or verbal cues Assist level: Supervision or verbal cues Assist level: Touching or steadying assistance (Pt > 75%)  Function - Comprehension Comprehension: Auditory Comprehension assist level: Follows complex conversation/direction with no assist  Function - Expression Expression: Verbal Expression assist level: Expresses complex ideas: With no assist  Function - Social Interaction Social Interaction assist level: Interacts appropriately with others - No medications needed.  Function - Problem Solving Problem solving assist level: Solves complex problems: Recognizes &  self-corrects  Function - Memory Memory assist level: Complete Independence: No helper Patient normally able to recall (first 3 days only): That he or she is in a hospital, Staff names and faces, Current season Medical Problem List and Plan: 1.  Decreased functional mobility secondary to bilateral TKA 07/03/2016. Weightbearing as tolerated CIR PT, OT, 2.  DVT Prophylaxis/Anticoagulation: Xarelto. Normal vascular studies 3. Pain Management: Celebrex 200 mg twice a day. Oxycodone and Robaxin as needed 4. Acute blood loss anemia. Continue iron supplement. Follow-up CBC- no changes CBC Latest Ref Rng & Units 07/10/2016 07/07/2016 07/06/2016  WBC 4.0 - 10.5 K/uL 5.6 6.2 6.5  Hemoglobin 12.0 - 15.0 g/dL 9.8(L) 9.7(L) 7.1(L)  Hematocrit 36.0 - 46.0 % 29.3(L) 28.0(L) 21.3(L)  Platelets 150 - 400 K/uL 260 178 146(L)   5. Neuropsych: This patient is capable of making decisions on her own behalf. 6. Skin/Wound Care: Routine skin checks 7. Fluids/Electrolytes/Nutrition: Routine I&O with follow-up chemistries 8. Constipation. Laxative assistance 9.  Hypoalb- prostat 10.  Post op dependant edema, vascular studies neg 11/5, cont elevation and ace  LOS (Days) 7 A FACE TO FACE EVALUATION WAS PERFORMED  KIRSTEINS,ANDREW E 07/14/2016, 7:16 AM

## 2016-07-14 NOTE — Discharge Instructions (Signed)
Inpatient Rehab Discharge Instructions  [name] Discharge date and time: No discharge date for patient encounter.   Activities/Precautions/ Functional Status: Activity: activity as tolerated Diet: regular diet Wound Care: keep wound clean and dry Functional status:  ___ No restrictions     ___ Walk up steps independently ___ 24/7 supervision/assistance   ___ Walk up steps with assistance ___ Intermittent supervision/assistance  ___ Bathe/dress independently ___ Walk with walker     _x__ Bathe/dress with assistance ___ Walk Independently    ___ Shower independently ___ Walk with assistance    ___ Shower with assistance ___ No alcohol     ___ Return to work/school ________  Special Instructions:  No driving  Continue XARELTO X 4 weeks from day of surgery   COMMUNITY REFERRALS UPON DISCHARGE:    Outpatient: PT  Agency:[facility] [phone]  Date of Last Service:07/15/2016  Appointment Date/Time:WILL CONTACT YOU FOR FOLLOW UP APPOINTMENT-NEED TO SEE MD FIRST  Medical Equipment/Items Ordered:HAS Vassie Moselle FROM PREVIOUS SURGERIES  Agency/Supplier:  My questions have been answered and I understand these instructions. I will adhere to these goals and the provided educational materials after my discharge from the hospital.  Patient/Caregiver Signature _______________________________ Date __________  Clinician Signature _______________________________________ Date __________  Please bring this form and your medication list with you to all your follow-up doctor's appointments.

## 2016-07-14 NOTE — Progress Notes (Signed)
Social Work  Discharge Note  The overall goal for the admission was met for:   Discharge location: [place] A FEW DAYS  Length of Stay: Yes-8 DAYS  Discharge activity level: Yes-MOD/I LEVEL  Home/community participation: Yes  Services provided included: MD, RD, PT, OT, RN, CM, Pharmacy and SW  Financial Services: Private Insurance: [employer]   Follow-up services arranged: Outpatient: [facility] WILL CALL HER FOR APPOINTMENT  Comments (or additional information):PT DID WELL AND PROGRESSED QUICKLY. HUSBAND TO TAKE SOME TIME OFF TO MAKE TRANSITION [facility] TO [facility].  Patient/Family verbalized understanding of follow-up arrangements: Yes  Individual responsible for coordination of the follow-up plan: SELF  Confirmed correct DME delivered: Elease Hashimoto 07/14/2016    Elease Hashimoto

## 2016-07-14 NOTE — Progress Notes (Signed)
Physical Therapy Discharge Summary  Patient Details  Name: [name] MRN: [id number] Date of Birth: 1951-04-06  Today's Date: 07/14/2016 PT Individual Time:   1300-1400 AND 1515-1630    PT Individual Time Calculation: 20mn  and 75 min     Patient has met 10 of 10 long term goals due to improved activity tolerance, improved balance, increased strength, increased range of motion, decreased pain and ability to compensate for deficits.  Patient to discharge at an ambulatory level Modified Independent.   Patient's care partner is independent to provide the necessary physical assistance at discharge.    Recommendation:  Patient will benefit from ongoing skilled PT services in outpatient setting to continue to advance safe functional mobility, address ongoing impairments in ROM, strength, gait, and minimize fall risk.  Equipment: No equipment provided  Reasons for discharge: treatment goals met and discharge from hospital  Patient/family agrees with progress made and goals achieved: Yes   PT Treatment:   session 1  PT instructed patient in Grad day assessment and assessed progress towards goals; see below for results. PT instructed patient in gait with RW, transfers, bed mobility car transfer with RW, stairs, curb with RWpicking object up from floor without cues or assist as list below. Nustep ROM therex with R knee flexion up to 79 degrees and L knee flexion up to 69 degrees.   Patient returned to room and left sitting in recliner with call bell in reach.   Sesion 2;  Gait training through hospital hall x 5077fand 40032fith RW and no cues or assist from PT.   PT instructed patient in HEP for:  supine hip abduction, SLR, and heel slide,  sitting knee flexion/extension, hip fleixon, hip abduction against resistance,  Standing mini squats in pain free range.   Gait training on uneven cement surface with x [id number] supervision assist from PT for RW management   Family ed  for daughter; Car transfer with distant supervision with cues for proper Ad management. Stair management on 4, 6 inch steps with supervision and ascent/descent of curb with RW for access into house. Min cues for backward descent technique to reduce pain in L knee.   Patient returned to room and left sitting in reliner with all needs met.     PT Discharge Precautions/RestrictionsPrecautions Precautions: Knee;Fall Restrictions Weight Bearing Restrictions: No RLE Weight Bearing: Weight bearing as tolerated LLE Weight Bearing: Weight bearing as tolerated Vital Signs   Pain Pain Assessment Pain Assessment: 0-10 Pain Score: 4  Pain Type: Surgical pain Pain Location: Leg Pain Orientation: Right;Left Pain Descriptors / Indicators: Other (Comment) (stiffness) Pain Onset: On-going Pain Intervention(s): Medication (See eMAR) Vision/Perception     Cognition Overall Cognitive Status: Within Functional Limits for tasks assessed Arousal/Alertness: Awake/alert Orientation Level: Oriented X4 Sustained Attention: Appears intact Memory: Appears intact Awareness: Appears intact Problem Solving: Appears intact Safety/Judgment: Appears intact Sensation Sensation Light Touch: Appears Intact Stereognosis: Appears Intact Proprioception: Appears Intact Coordination Gross Motor Movements are Fluid and Coordinated: Yes Fine Motor Movements are Fluid and Coordinated: Yes Motor  Motor Motor: Within Functional Limits  Mobility Bed Mobility Bed Mobility: Rolling Right;Rolling Left;Supine to Sit;Sit to Supine Rolling Right: 6: Modified independent (Device/Increase time) Rolling Left: 6: Modified independent (Device/Increase time) Supine to Sit: 6: Modified independent (Device/Increase time) Sit to Supine: 6: Modified independent (Device/Increase time);HOB flat Transfers Sit to Stand: With upper extremity assist;From toilet;6: Modified independent (Device/Increase time) Stand to Sit: 6:  Modified independent (Device/Increase time);With upper extremity assist;With  armrests Locomotion  Ambulation Ambulation: Yes Ambulation/Gait Assistance: 6: Modified independent (Device/Increase time) Ambulation Distance (Feet): 300 Feet Assistive device: Rolling walker Gait Gait: Yes Gait Pattern: Impaired Gait Pattern: Left circumduction;Decreased hip/knee flexion - right;Decreased hip/knee flexion - left Stairs / Additional Locomotion Stairs: Yes Stairs Assistance: 5: Supervision Stairs Assistance Details: Verbal cues for precautions/safety;Verbal cues for gait pattern Stair Management Technique: Two rails Number of Stairs: 12 Height of Stairs: 6 Ramp: 6: Modified independent (Device) Curb: 5: Psychiatric nurse: Yes Wheelchair Assistance: 6: Modified independent (Device/Increase time) Environmental health practitioner: Both upper extremities Wheelchair Parts Management: Independent Distance: 275f   Trunk/Postural Assessment  Cervical Assessment Cervical Assessment: Within Functional Limits Thoracic Assessment Thoracic Assessment: Within Functional Limits Lumbar Assessment Lumbar Assessment: Within Functional Limits  Balance Balance Balance Assessed: Yes Dynamic Sitting Balance Dynamic Sitting - Balance Support: During functional activity Dynamic Sitting - Level of Assistance: 7: Independent Static Standing Balance Static Standing - Balance Support: No upper extremity supported Static Standing - Level of Assistance: 7: Independent Dynamic Standing Balance Dynamic Standing - Balance Support: During functional activity;Bilateral upper extremity supported Dynamic Standing - Level of Assistance: 6: Modified independent (Device/Increase time) Extremity Assessment  RUE Assessment RUE Assessment: Within Functional Limits LUE Assessment LUE Assessment: Within Functional Limits RLE Assessment RLE Assessment: Exceptions to [facility] Post-AcuteRLE AROM  (degrees) Right Knee Extension: 5 Right Knee Flexion: 79 RLE Strength RLE Overall Strength Comments: 4+/5 to 5/5 proximal to distal within available range.   LLE Assessment LLE Assessment: Exceptions to [facility] AROM (degrees) Left Knee Extension: 2 Left Knee Flexion: 69 LLE Strength LLE Overall Strength Comments: 4+/5 to 5/5 in available range   See Function Navigator for Current Functional Status.  ALorie Phenix11/06/2016, 3:39 PM

## 2016-07-14 NOTE — Progress Notes (Signed)
Occupational Therapy Discharge Summary  Patient Details  Name: KERIANA SARSFIELD MRN: [id number] Date of Birth: [birth year]  Today's Date: 07/14/2016 OT Individual Time: 0901-1002 OT Individual Time Calculation (min): 61 min   Session Note:  Pt completed all aspects of bathing and dressing sit to stand with modified independence.  Therapist did assist with wrapping B knees for shower as he was unsure if current bandages were waterproof.  She used the RW for all transfers and for gathering items needed for shower as well as tub/shower bench to sit on during bathing for safety.  Discussed home work setup at end of session with emphasis on establishing a routine of sitting up in chair with LEs down for intervals of time and alternating with resting sessions in the bed with LEs elevated.  Pt voiced understanding and will work on this once she gets home.    Patient has met 10 of 10 long term goals due to improved activity tolerance, improved balance and ability to compensate for deficits.  Patient to discharge at overall Modified Independent level.  Patient's care partner is independent to provide the necessary physical assistance at discharge.    Reasons goals not met: NA  Recommendation:  Pt is currently modified independent for all selfcare tasks using her RW and needed DME.  Feel she will not need any further OT at this time.    Equipment: No equipment provided  Reasons for discharge: treatment goals met and discharge from hospital  Patient/family agrees with progress made and goals achieved: Yes  OT Discharge Precautions/Restrictions Precautions Precautions: Knee;Fall Restrictions Weight Bearing Restrictions: No RLE Weight Bearing: Weight bearing as tolerated LLE Weight Bearing: Weight bearing as tolerated  Pain Pain Assessment Pain Assessment: 0-10 Pain Score: 4  Faces Pain Scale: Hurts little more Pain Type: Surgical pain Pain Location: Knee Pain Orientation: Right;Left Pain  Descriptors / Indicators: Spasm Pain Onset: On-going Pain Intervention(s): Medication (See eMAR) ADL ADL ADL Comments: see functional navigator Vision/Perception  Vision- History Baseline Vision/History: Wears glasses Wears Glasses: Distance only Patient Visual Report: No change from baseline Vision- Assessment Vision Assessment?: No apparent visual deficits  Cognition Overall Cognitive Status: Within Functional Limits for tasks assessed Arousal/Alertness: Awake/alert Orientation Level: Oriented X4 Sustained Attention: Appears intact Memory: Appears intact Awareness: Appears intact Problem Solving: Appears intact Safety/Judgment: Appears intact Sensation Sensation Light Touch: Appears Intact Stereognosis: Appears Intact Proprioception: Appears Intact Coordination Gross Motor Movements are Fluid and Coordinated: Yes Fine Motor Movements are Fluid and Coordinated: Yes Motor  Motor Motor: Within Functional Limits Mobility  Bed Mobility Bed Mobility: Rolling Right;Rolling Left;Supine to Sit;Sit to Supine Rolling Right: 6: Modified independent (Device/Increase time) Rolling Left: 6: Modified independent (Device/Increase time) Supine to Sit: 6: Modified independent (Device/Increase time) Sit to Supine: 6: Modified independent (Device/Increase time);HOB flat Transfers Transfers: Sit to Stand;Stand to Sit Sit to Stand: With upper extremity assist;From toilet;6: Modified independent (Device/Increase time) Stand to Sit: 6: Modified independent (Device/Increase time);With upper extremity assist;With armrests  Trunk/Postural Assessment  Cervical Assessment Cervical Assessment: Within Functional Limits Thoracic Assessment Thoracic Assessment: Within Functional Limits Lumbar Assessment Lumbar Assessment: Within Functional Limits  Balance Balance Balance Assessed: Yes Dynamic Sitting Balance Dynamic Sitting - Balance Support: During functional activity Dynamic Sitting - Level  of Assistance: 7: Independent Static Standing Balance Static Standing - Balance Support: No upper extremity supported Static Standing - Level of Assistance: 7: Independent Dynamic Standing Balance Dynamic Standing - Balance Support: During functional activity;Bilateral upper extremity supported Dynamic Standing - Level of Assistance:  6: Modified independent (Device/Increase time) Extremity/Trunk Assessment RUE Assessment RUE Assessment: Within Functional Limits LUE Assessment LUE Assessment: Within Functional Limits   See Function Navigator for Current Functional Status.  , OTR/L 07/14/2016, 12:59 PM  

## 2016-07-14 NOTE — Discharge Summary (Signed)
Discharge summary job [id number]

## 2016-07-14 NOTE — Discharge Summary (Signed)
[name], [name] NO.:  [id number]  MEDICAL RECORD NO.:  [id number]  LOCATION:  [id number]                        FACILITY:  [facility]  PHYSICIAN:  Charlett Blake, M.D.DATE OF BIRTH:  Apr 09, 1951  DATE OF ADMISSION:  07/07/2016 DATE OF DISCHARGE:  07/15/2016                              DISCHARGE SUMMARY   DISCHARGE DIAGNOSES: 1. 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 2. Xarelto for deep venous thrombosis prophylaxis. 3. Pain management. 4. Acute blood loss anemia. 5. Constipation, resolved. 6. Decreased nutritional storage.  HISTORY OF PRESENT ILLNESS:  This is a 65 year old right-handed female, unremarkable past medical history, who lives with her spouse, independent prior to admission.  Husband works during the day. Presented on June 30, 2016, with bilateral knee pain secondary to end- stage osteoarthritis and no relief with conservative care.  Underwent bilateral TKA, July 03, 2016, per Dr. Alvan Guerra.  Weightbearing as tolerated.  HOSPITAL COURSE:  Pain management.  Placed on Xarelto for DVT prophylaxis.  Acute blood loss anemia at 7.1, she was transfused. Physical and occupational therapy ongoing.  The patient was admitted for comprehensive rehab program.  PAST MEDICAL HISTORY:  See discharge diagnoses.  SOCIAL HISTORY:  Independent with occasional walker prior to admission, living with her husband.  Functional status upon admission to rehab services was moderate assist 20 feet rolling walker, moderate assist sit to stand, min to mod assist with activities of daily living.  PHYSICAL EXAMINATION:  VITAL SIGNS:  Blood pressure 108/63, pulse 80, temperature 97, respirations 16.  This was an alert female, oriented x3. LUNGS:  Clear to auscultation without wheeze. CARDIAC:  Regular rate and rhythm.  No murmur. ABDOMEN:  Soft, nontender.  Good bowel sounds. EXTREMITIES:  Bilateral knee incisions dressed appropriately,  tender. Neurovascular sensation intact.  REHABILITATION HOSPITAL COURSE:  The patient was admitted to inpatient rehab services with therapies initiated on a 3-hour daily basis, consisting of physical therapy, occupational therapy, and rehabilitation nursing.  The following issues were addressed during the patient's rehabilitation stay:  Pertaining to [name], bilateral TKA. Weightbearing as tolerated.  Neurovascular sensation intact.  Followup x- rays of the left knee due to some increased pain showed good position of hardware.  No acute changes.  She would follow up with [facility]opedic [facility], maintained on Xarelto for DVT prophylaxis.  Venous Doppler study is negative.  Pain management with the use of Celebrex twice daily as well as oxycodone, Robaxin as needed.  Acute blood loss anemia. Latest hemoglobin 9.8, maintained on iron supplement.  No chest pain or shortness of breath.  She had bouts of constipation resolved with laxative assistance.  The patient received weekly collaborative interdisciplinary team conferences to discuss estimated length of stay, family teaching, any barriers to discharge.  She was ambulating in excess of 250 feet rolling walker, supervision, sit to stand, standby assist.  The patient instructed in additional gait over various surfaces, including wood, tile, carpet, and seamless tile with rolling walker.  Seated knee flexion, extension; max knee flexion on the left lower extremity 66, on right of 78 degrees.  CPM machine ongoing.  She can gather her belongings for activities of daily living and homemaking. Family teaching completed  and plan discharge to home.  DISCHARGE MEDICATIONS:  Included 1. Celebrex 200 mg p.o. every 12 hours. 2. Colace 100 mg p.o. b.i.d. 3. Ferrous sulfate 325 mg p.o. t.i.d. 4. MiraLAX twice daily, hold for loose stools. 5. Xarelto 10 mg p.o. daily x4 weeks total and stop. 6. Robaxin 500 mg p.o. every 6 hours as needed muscle  spasms. 7. Oxycodone immediate release 10 mg p.o. every 4 hours as needed     pain.  DIET:  Her diet is regular.  FOLLOWUP:  She would follow up with Dr. Alvan Guerra of [facility]. Call for appointment Dr. Alysia Guerra as needed.  Weightbearing as tolerated with special instructions.     [name], P.A.   ______________________________ Charlett Blake, M.D.    DA/MEDQ  D:  07/14/2016  T:  07/14/2016  Job:  [id number]  cc:   Charlett Blake, M.D. [name] [name], M.D.

## 2016-07-15 DIAGNOSIS — N3000 Acute cystitis without hematuria: Secondary | ICD-10-CM

## 2016-07-15 LAB — URINE CULTURE: Culture: >=100000 — AB

## 2016-07-15 MED ORDER — NITROFURANTOIN MONOHYD MACRO 100 MG PO CAPS
100.0000 mg | ORAL_CAPSULE | Freq: Two times a day (BID) | ORAL | Status: DC
  Administered 2016-07-15: 100 mg via ORAL
  Filled 2016-07-15: qty 1

## 2016-07-15 NOTE — Progress Notes (Signed)
Subjective/Complaints: Still with dysuria, no sweats or chills   ROS- neg CP , SOB, N/V/D  Objective: Vital Signs: Blood pressure 119/68, pulse 79, temperature 98.1 F (36.7 C), temperature source Oral, resp. rate 18, height 5\' 7"  (1.702 m), weight 71.9 kg (158 lb 8 oz), SpO2 100 %. No results found. Results for orders placed or performed during the hospital encounter of 07/07/16 (from the past 72 hour(s))  Urinalysis, Routine w reflex microscopic (not at [facility])     Status: Abnormal   Collection Time: 07/12/16 10:21 PM  Result Value Ref Range   Color, Urine AMBER (A) YELLOW    Comment: BIOCHEMICALS MAY BE AFFECTED BY COLOR   APPearance CLOUDY (A) CLEAR   Specific Gravity, Urine 1.026 1.005 - 1.030   pH 5.5 5.0 - 8.0   Glucose, UA NEGATIVE NEGATIVE mg/dL   Hgb urine dipstick SMALL (A) NEGATIVE   Bilirubin Urine NEGATIVE NEGATIVE   Ketones, ur NEGATIVE NEGATIVE mg/dL   Protein, ur NEGATIVE NEGATIVE mg/dL   Nitrite NEGATIVE NEGATIVE   Leukocytes, UA LARGE (A) NEGATIVE  Urine culture     Status: Abnormal (Preliminary result)   Collection Time: 07/12/16 10:21 PM  Result Value Ref Range   Specimen Description URINE, RANDOM    Special Requests NONE    Culture >=100,000 COLONIES/mL ESCHERICHIA COLI (A)    Report Status PENDING   Urine microscopic-add on     Status: Abnormal   Collection Time: 07/12/16 10:21 PM  Result Value Ref Range   Squamous Epithelial / LPF 0-5 (A) NONE SEEN   WBC, UA TOO NUMEROUS TO COUNT 0 - 5 WBC/hpf   RBC / HPF 0-5 0 - 5 RBC/hpf   Bacteria, UA FEW (A) NONE SEEN      General: No acute distress Mood and affect are appropriate Heart: Regular rate and rhythm no rubs murmurs or extra sounds Lungs: Clear to auscultation, breathing unlabored, no rales or wheezes Abdomen: Positive bowel sounds, soft nontender to palpation, nondistended Extremities: No clubbing, cyanosis, or edema Skin: No evidence of breakdown, no evidence of rash Neurologic: Cranial  nerves II through XII intact, motor strength is 5/5 in bilateral deltoid, bicep, tricep, grip,4- hip flexor, knee extensors, 4/5 ankle dorsiflexor and plantar flexor Sensory exam normal sensation to light touch lower extremities  Musculoskeletal: Full range of motion in all 4 extremities. No joint swelling   Assessment/Plan: 1. Functional deficits secondary to B TKR for end stage OA which require 3+ hours per day of interdisciplinary therapy in a comprehensive inpatient rehab setting. Physiatrist is providing close team supervision and 24 hour management of active medical problems listed below. Physiatrist and rehab team continue to assess barriers to discharge/monitor patient progress toward functional and medical goals. FIM: Function - Bathing Position: Shower Body parts bathed by patient: Right arm, Left arm, Chest, Abdomen, Front perineal area, Buttocks, Right upper leg, Left upper leg, Right lower leg, Left lower leg, Back Body parts bathed by helper: Right lower leg, Left lower leg, Back Assist Level: More than reasonable time  Function- Upper Body Dressing/Undressing What is the patient wearing?: Pull over shirt/dress Pull over shirt/dress - Perfomed by patient: Thread/unthread right sleeve, Thread/unthread left sleeve, Put head through opening, Pull shirt over trunk Assist Level: More than reasonable time Set up : To obtain clothing/put away Function - Lower Body Dressing/Undressing What is the patient wearing?: Shoes, [employer], Pants, Underwear Position:  (Bedside chair) Underwear - Performed by patient: Thread/unthread left underwear leg, Thread/unthread right underwear leg,  Pull underwear up/down Underwear - Performed by helper: Thread/unthread left underwear leg, Thread/unthread right underwear leg Pants- Performed by patient: Thread/unthread right pants leg, Thread/unthread left pants leg, Pull pants up/down, Fasten/unfasten pants Pants- Performed by helper: Thread/unthread  left pants leg, Thread/unthread right pants leg Non-skid slipper socks- Performed by helper: Don/doff right sock, Don/doff left sock Shoes - Performed by patient: Don/doff right shoe, Don/doff left shoe, Fasten right, Fasten left Shoes - Performed by helper: Don/doff right shoe, Don/doff left shoe, Fasten right, Fasten left TED Hose - Performed by patient: Don/doff right TED hose, Don/doff left TED hose TED Hose - Performed by helper: Don/doff right TED hose, Don/doff left TED hose Assist for footwear: Maximal assist Assist for lower body dressing: More than reasonable time  Function - Toileting Toileting steps completed by patient: Adjust clothing prior to toileting, Performs perineal hygiene, Adjust clothing after toileting Toileting Assistive Devices: Grab bar or rail Assist level: More than reasonable time  Function - Air cabin crew transfer assistive device: Walker Assist level to toilet: No Help, no cues, assistive device, takes more than a reasonable amount of time Assist level from toilet: No Help, no cues, assistive device, takes more than a reasonable amount of time  Function - Chair/bed transfer Chair/bed transfer method: Stand pivot Chair/bed transfer assist level: No Help, no cues, assistive device, takes more than a reasonable amount of time Chair/bed transfer assistive device: Walker Chair/bed transfer details: Verbal cues for technique  Function - Locomotion: Wheelchair Will patient use wheelchair at discharge?: No Type: Manual Wheelchair activity did not occur: N/A Max wheelchair distance: 213ft  Assist Level: Supervision or verbal cues Wheel 50 feet with 2 turns activity did not occur: N/A Assist Level: Supervision or verbal cues Wheel 150 feet activity did not occur: N/A Assist Level: Supervision or verbal cues Function - Locomotion: Ambulation Assistive device: Walker-rolling Max distance: 523ft  Assist level: No help, No cues, assistive device,  takes more than a reasonable amount of time Assist level: No help, No cues, assistive device, takes more than a reasonable amount of time Assist level: No help, No cues, assistive device, takes more than a reasonable amount of time Assist level: No help, No cues, assistive device, takes more than a reasonable amount of time Assist level: No help, No cues, assistive device, takes more than a reasonable amount of time  Function - Comprehension Comprehension: Auditory Comprehension assist level: Follows complex conversation/direction with no assist  Function - Expression Expression: Verbal Expression assist level: Expresses complex ideas: With no assist  Function - Social Interaction Social Interaction assist level: Interacts appropriately with others - No medications needed.  Function - Problem Solving Problem solving assist level: Solves complex problems: Recognizes & self-corrects  Function - Memory Memory assist level: Complete Independence: No helper Patient normally able to recall (first 3 days only): Current season, Location of own room, Staff names and faces, That he or she is in a hospital Medical Problem List and Plan: 1.  Decreased functional mobility secondary to bilateral TKA 07/03/2016. Weightbearing as tolerated CIR PT, OT, 2.  DVT Prophylaxis/Anticoagulation: Xarelto. Normal vascular studies 3. Pain Management: Celebrex 200 mg twice a day. Oxycodone and Robaxin as needed 4. Acute blood loss anemia. Continue iron supplement. Follow-up CBC- no changes CBC Latest Ref Rng & Units 07/10/2016 07/07/2016 07/06/2016  WBC 4.0 - 10.5 K/uL 5.6 6.2 6.5  Hemoglobin 12.0 - 15.0 g/dL 9.8(L) 9.7(L) 7.1(L)  Hematocrit 36.0 - 46.0 % 29.3(L) 28.0(L) 21.3(L)  Platelets 150 - 400  K/uL 260 178 146(L)   5. Neuropsych: This patient is capable of making decisions on her own behalf. 6. Skin/Wound Care: Routine skin checks 7. Fluids/Electrolytes/Nutrition: Routine I&O with follow-up chemistries 8.  Constipation. Laxative assistance 9.  Hypoalb- prostat 10.  Post op dependant edema, vascular studies neg 11/5, cont elevation and ace  LOS (Days) 8 A FACE TO FACE EVALUATION WAS PERFORMED  KIRSTEINS,ANDREW E 07/15/2016, 6:53 AM

## 2016-07-15 NOTE — Progress Notes (Signed)
Patient discharged to home with family at 29. Patient given discharge instructions yesterday via Silvestre Mesi PA. Patient denied any questions.

## 2017-02-28 ENCOUNTER — Other Ambulatory Visit: Payer: Self-pay | Admitting: Internal Medicine

## 2017-02-28 DIAGNOSIS — Z1231 Encounter for screening mammogram for malignant neoplasm of breast: Secondary | ICD-10-CM

## 2017-03-13 ENCOUNTER — Encounter (INDEPENDENT_AMBULATORY_CARE_PROVIDER_SITE_OTHER): Payer: Self-pay

## 2017-03-13 ENCOUNTER — Ambulatory Visit
Admission: RE | Admit: 2017-03-13 | Discharge: 2017-03-13 | Disposition: A | Discharge status: Home / Self Care | Payer: BLUE CROSS/BLUE SHIELD | Source: Ambulatory Visit | Attending: Internal Medicine | Admitting: Internal Medicine

## 2017-03-13 DIAGNOSIS — Z1231 Encounter for screening mammogram for malignant neoplasm of breast: Secondary | ICD-10-CM

## 2017-08-21 ENCOUNTER — Other Ambulatory Visit: Payer: Self-pay | Admitting: Internal Medicine

## 2017-08-21 ENCOUNTER — Ambulatory Visit
Admission: RE | Admit: 2017-08-21 | Discharge: 2017-08-21 | Disposition: A | Discharge status: Home / Self Care | Payer: Medicare Other | Source: Ambulatory Visit | Attending: Internal Medicine | Admitting: Internal Medicine

## 2017-08-21 DIAGNOSIS — R079 Chest pain, unspecified: Secondary | ICD-10-CM

## 2017-08-21 MED ORDER — IOPAMIDOL (ISOVUE-370) INJECTION 76%
75.0000 mL | Freq: Once | INTRAVENOUS | Status: AC | PRN
  Administered 2017-08-21: 75 mL via INTRAVENOUS

## 2017-08-31 ENCOUNTER — Encounter (HOSPITAL_COMMUNITY): Payer: Self-pay | Admitting: Emergency Medicine

## 2017-08-31 ENCOUNTER — Inpatient Hospital Stay (HOSPITAL_COMMUNITY)
Admission: EM | Admit: 2017-08-31 | Discharge: 2017-09-03 | DRG: 287 | Disposition: A | Discharge status: Home / Self Care | Payer: BLUE CROSS/BLUE SHIELD | Attending: Interventional Cardiology | Admitting: Interventional Cardiology

## 2017-08-31 ENCOUNTER — Emergency Department (HOSPITAL_COMMUNITY): Payer: BLUE CROSS/BLUE SHIELD

## 2017-08-31 DIAGNOSIS — I10 Essential (primary) hypertension: Secondary | ICD-10-CM | POA: Diagnosis present

## 2017-08-31 DIAGNOSIS — I200 Unstable angina: Secondary | ICD-10-CM | POA: Diagnosis present

## 2017-08-31 DIAGNOSIS — Z880 Allergy status to penicillin: Secondary | ICD-10-CM

## 2017-08-31 DIAGNOSIS — E785 Hyperlipidemia, unspecified: Secondary | ICD-10-CM | POA: Diagnosis present

## 2017-08-31 DIAGNOSIS — E876 Hypokalemia: Secondary | ICD-10-CM

## 2017-08-31 DIAGNOSIS — I25110 Atherosclerotic heart disease of native coronary artery with unstable angina pectoris: Principal | ICD-10-CM | POA: Diagnosis present

## 2017-08-31 HISTORY — DX: Other ill-defined heart diseases: I51.89

## 2017-08-31 HISTORY — DX: Chest pain, unspecified: R07.9

## 2017-08-31 LAB — CBC WITH DIFFERENTIAL/PLATELET
BASOS ABS: 0 10*3/uL (ref 0.0–0.1)
Basophils Relative: 0 %
Eosinophils Absolute: 0.1 10*3/uL (ref 0.0–0.7)
Eosinophils Relative: 1 %
HEMATOCRIT: 42.2 % (ref 36.0–46.0)
Hemoglobin: 14.4 g/dL (ref 12.0–15.0)
LYMPHS PCT: 29 %
Lymphs Abs: 2.6 10*3/uL (ref 0.7–4.0)
MCH: 31.3 pg (ref 26.0–34.0)
MCHC: 34.1 g/dL (ref 30.0–36.0)
MCV: 91.7 fL (ref 78.0–100.0)
MONO ABS: 0.8 10*3/uL (ref 0.1–1.0)
Monocytes Relative: 9 %
NEUTROS ABS: 5.6 10*3/uL (ref 1.7–7.7)
Neutrophils Relative %: 61 %
Platelets: 224 10*3/uL (ref 150–400)
RBC: 4.6 MIL/uL (ref 3.87–5.11)
RDW: 12.7 % (ref 11.5–15.5)
WBC: 9.1 10*3/uL (ref 4.0–10.5)

## 2017-08-31 LAB — COMPREHENSIVE METABOLIC PANEL
ALT: 35 U/L (ref 14–54)
AST: 38 U/L (ref 15–41)
Albumin: 4 g/dL (ref 3.5–5.0)
Alkaline Phosphatase: 77 U/L (ref 38–126)
Anion gap: 13 (ref 5–15)
BUN: 18 mg/dL (ref 6–20)
CHLORIDE: 107 mmol/L (ref 101–111)
CO2: 17 mmol/L — AB (ref 22–32)
CREATININE: 0.78 mg/dL (ref 0.44–1.00)
Calcium: 8.5 mg/dL — ABNORMAL LOW (ref 8.9–10.3)
GFR calc Af Amer: >60 mL/min (ref >60)
GFR calc non Af Amer: >60 mL/min (ref >60)
Glucose, Bld: 121 mg/dL — ABNORMAL HIGH (ref 65–99)
Potassium: 2.7 mmol/L — CL (ref 3.5–5.1)
SODIUM: 137 mmol/L (ref 135–145)
Total Bilirubin: 0.5 mg/dL (ref 0.3–1.2)
Total Protein: 6.5 g/dL (ref 6.5–8.1)

## 2017-08-31 LAB — LIPASE, BLOOD: Lipase: 36 U/L (ref 11–51)

## 2017-08-31 LAB — I-STAT TROPONIN, ED: TROPONIN I, POC: 0 ng/mL (ref 0.00–0.08)

## 2017-08-31 LAB — TROPONIN I: Troponin I: <0.03 ng/mL (ref <0.03)

## 2017-08-31 LAB — LIPID PANEL
CHOL/HDL RATIO: 2.6 ratio
Cholesterol: 189 mg/dL (ref 0–200)
HDL: 72 mg/dL (ref >40)
LDL Cholesterol: 105 mg/dL — ABNORMAL HIGH (ref 0–99)
Triglycerides: 60 mg/dL (ref <150)
VLDL: 12 mg/dL (ref 0–40)

## 2017-08-31 LAB — PROTIME-INR
INR: 1.03
Prothrombin Time: 13.4 seconds (ref 11.4–15.2)

## 2017-08-31 LAB — APTT: APTT: 23 s — AB (ref 24–36)

## 2017-08-31 MED ORDER — ONDANSETRON HCL 4 MG/2ML IJ SOLN
4.0000 mg | Freq: Once | INTRAMUSCULAR | Status: AC
  Administered 2017-08-31: 4 mg via INTRAVENOUS
  Filled 2017-08-31: qty 2

## 2017-08-31 MED ORDER — MORPHINE SULFATE (PF) 4 MG/ML IV SOLN
4.0000 mg | Freq: Once | INTRAVENOUS | Status: AC
  Administered 2017-08-31: 4 mg via INTRAVENOUS
  Filled 2017-08-31: qty 1

## 2017-08-31 MED ORDER — ASPIRIN 81 MG PO CHEW
324.0000 mg | CHEWABLE_TABLET | Freq: Once | ORAL | Status: AC
  Administered 2017-08-31: 324 mg via ORAL
  Filled 2017-08-31: qty 4

## 2017-08-31 MED ORDER — POTASSIUM CHLORIDE CRYS ER 20 MEQ PO TBCR
40.0000 meq | EXTENDED_RELEASE_TABLET | Freq: Once | ORAL | Status: AC
  Administered 2017-08-31: 40 meq via ORAL
  Filled 2017-08-31: qty 2

## 2017-08-31 MED ORDER — HEPARIN (PORCINE) IN NACL 100-0.45 UNIT/ML-% IJ SOLN
850.0000 [IU]/h | INTRAMUSCULAR | Status: DC
  Administered 2017-08-31: 850 [IU]/h via INTRAVENOUS
  Filled 2017-08-31: qty 250

## 2017-08-31 MED ORDER — SODIUM CHLORIDE 0.9 % IV SOLN
INTRAVENOUS | Status: DC
  Administered 2017-08-31: 19:00:00 via INTRAVENOUS

## 2017-08-31 MED ORDER — NITROGLYCERIN 0.4 MG SL SUBL
0.4000 mg | SUBLINGUAL_TABLET | SUBLINGUAL | Status: DC | PRN
Start: 2017-08-31 — End: 2017-09-03
  Administered 2017-08-31 – 2017-09-02 (×6): 0.4 mg via SUBLINGUAL
  Filled 2017-08-31 (×3): qty 1

## 2017-08-31 MED ORDER — HEPARIN BOLUS VIA INFUSION
4000.0000 [IU] | Freq: Once | INTRAVENOUS | Status: AC
  Administered 2017-08-31: 4000 [IU] via INTRAVENOUS
  Filled 2017-08-31: qty 4000

## 2017-08-31 NOTE — ED Notes (Signed)
Portable xray at bedside.

## 2017-08-31 NOTE — ED Provider Notes (Signed)
[name]   CSN: [id number] Arrival date & time: 08/31/17  1826     History   Chief Complaint Chief Complaint  Patient presents with  . Chest Pain    HPI [name] is a 66 y.o. female.  Patient is a 66 year old female with a history of chronic knee pain related to arthritis who presents with chest pain.  She describes a burning discomfort in her epigastrium radiating to the center of her chest and straight through to her back.  She states she had an episode about 2 weeks ago while she was driving.  At that time she went to a local emergency department near [place] and had an evaluation for heart.  She was discharge.  She was told her EKG appeared normal.  She followed up with her PCP who arranged for her to have a CT angios her chest which was done on December 18 and was normal.  She is scheduled to have a stress test next week.  She states she has been having some intermittent pain to her epigastrium radiating to her back and at times it radiates to her neck with some tightness in her neck and some discomfort in both of her arms.  She states today she was at a movie theater and her symptoms got worse which is why she came in to the emergency department.  She currently has pain in her epigastrium which goes through to her back and some discomfort in her arms.  She is not sure if anything makes her symptoms worse although she feels like it may get a little worse when she eats.  She has had some nausea but no vomiting.  She did get clammy today with her pain      Past Medical History:  Diagnosis Date  . Arthritis    osteoarthritis knees mostly    Patient Active Problem List   Diagnosis Date Noted  . Unstable angina ([place]) 08/31/2017  . 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 Constipation due to pain medication   . Post-operative pain   . Abnormality of gait   . Acute blood loss anemia 07/06/2016  . S/P  bilateral TKAs 07/03/2016    Past Surgical History:  Procedure Laterality Date  . DIAGNOSTIC LAPAROSCOPY     ovarian cystectomy/ BSO done.  Marland Kitchen HARDWARE REMOVAL Left 07/03/2016   Procedure: LEFT KNEE HARDWARE REMOVAL;  Surgeon: Paralee Cancel, MD;  Location: [facility];  Service: Orthopedics;  Laterality: Left;  . KNEE SURGERY Bilateral    ACL x2, meniscectomy x3, open patella(bone from other knee)  . ROTATOR CUFF REPAIR Right   . TOTAL KNEE ARTHROPLASTY Bilateral 07/03/2016   Procedure: TOTAL KNEE BILATERAL ARTHROPLASTY;  Surgeon: Paralee Cancel, MD;  Location: [facility];  Service: Orthopedics;  Laterality: Bilateral;    OB History    No data available       Home Medications    Prior to Admission medications   Medication Sig Start Date End Date Taking? Authorizing Provider  aspirin EC 81 MG tablet Take 81 mg by mouth at bedtime.   Yes [provider]  aspirin-acetaminophen-caffeine (EXCEDRIN MIGRAINE) [phone] MG tablet Take 2 tablets by mouth every 6 (six) hours as needed for headache.   Yes [provider]  Cholecalciferol 1000 units tablet Take 1,000 Units by mouth 2 (two) times daily.   Yes [provider]  lisinopril (PRINIVIL,ZESTRIL) 5 MG tablet Take 5 mg by mouth daily. 08/20/17  Yes [provider]  omeprazole (PRILOSEC) 20 MG capsule Take 20 mg by mouth daily. 08/20/17  Yes [provider]  polyethylene glycol (MIRALAX / GLYCOLAX) packet Take 17 g by mouth 2 (two) times daily. 07/07/16  Yes Babish, Rodman Key, PA-C  simvastatin (ZOCOR) 40 MG tablet Take 40 mg by mouth every evening. 08/20/17  Yes [provider]  acetaminophen (TYLENOL) 500 MG tablet Take 2 tablets (1,000 mg total) by mouth every 8 (eight) hours. Patient not taking: Reported on 08/31/2017 07/07/16   Danae Orleans, PA-C  celecoxib (CELEBREX) 200 MG capsule Take 1 capsule (200 mg total) by mouth every 12 (twelve) hours. Patient not taking: Reported on 08/31/2017  07/14/16   Angiulli, Lavon Paganini, PA-C  docusate sodium (COLACE) 100 MG capsule Take 1 capsule (100 mg total) by mouth 2 (two) times daily. Patient not taking: Reported on 08/31/2017 07/07/16   Danae Orleans, PA-C  ferrous sulfate 325 (65 FE) MG tablet Take 1 tablet (325 mg total) by mouth 3 (three) times daily after meals. Patient not taking: Reported on 08/31/2017 07/14/16   Angiulli, Lavon Paganini, PA-C  methocarbamol (ROBAXIN) 500 MG tablet Take 1 tablet (500 mg total) by mouth every 6 (six) hours as needed for muscle spasms. Patient not taking: Reported on 08/31/2017 07/14/16   Angiulli, Lavon Paganini, PA-C  oxyCODONE 10 MG TABS Take 1 tablet (10 mg total) by mouth every 4 (four) hours as needed for moderate pain. Patient not taking: Reported on 08/31/2017 07/14/16   Angiulli, Lavon Paganini, PA-C  rivaroxaban (XARELTO) 10 MG TABS tablet Take 1 tablet (10 mg total) by mouth daily. 07/14/16 07/28/16  AngiulliLavon Paganini, PA-C    Family History Family History  Problem Relation Age of Onset  . Breast cancer Mother     Social History Social History   Tobacco Use  . Smoking status: Never Smoker  . Smokeless tobacco: Never Used  Substance Use Topics  . Alcohol use: Yes    Comment: social x3-4 week  . Drug use: No     Allergies   Penicillins and Tetracyclines & related   Review of Systems Review of Systems  Constitutional: Positive for diaphoresis. Negative for chills, fatigue and fever.  HENT: Negative for congestion, rhinorrhea and sneezing.   Eyes: Negative.   Respiratory: Positive for shortness of breath. Negative for cough and chest tightness.   Cardiovascular: Positive for chest pain. Negative for leg swelling.  Gastrointestinal: Positive for nausea. Negative for abdominal pain, blood in stool, diarrhea and vomiting.  Genitourinary: Negative for difficulty urinating, flank pain, frequency and hematuria.  Musculoskeletal: Negative for arthralgias and back pain.  Skin: Negative for rash.    Neurological: Negative for dizziness, speech difficulty, weakness, numbness and headaches.     Physical Exam Updated Vital Signs BP 126/63   Pulse 73   Resp 15   SpO2 98%   Physical Exam  Constitutional: She is oriented to person, place, and time. She appears well-developed and well-nourished.  HENT:  Head: Normocephalic and atraumatic.  Eyes: Pupils are equal, round, and reactive to light.  Neck: Normal range of motion. Neck supple.  Cardiovascular: Normal rate, regular rhythm and normal heart sounds.  Pulmonary/Chest: Effort normal and breath sounds normal. No respiratory distress. She has no wheezes. She has no rales. She exhibits no tenderness.  Abdominal: Soft. Bowel sounds are normal. There is no tenderness. There is no rebound and no guarding.  Musculoskeletal: Normal range of motion. She exhibits no edema.  Lymphadenopathy:  She has no cervical adenopathy.  Neurological: She is alert and oriented to person, place, and time.  Skin: Skin is warm and dry. No rash noted.  Psychiatric: She has a normal mood and affect.     ED Treatments / Results  Labs (all labs ordered are listed, but only abnormal results are displayed) Labs Reviewed  APTT - Abnormal; Notable for the following components:      Result Value   aPTT 23 (*)    All other components within normal limits  COMPREHENSIVE METABOLIC PANEL - Abnormal; Notable for the following components:   Potassium 2.7 (*)    CO2 17 (*)    Glucose, Bld 121 (*)    Calcium 8.5 (*)    All other components within normal limits  LIPID PANEL - Abnormal; Notable for the following components:   LDL Cholesterol 105 (*)    All other components within normal limits  CBC WITH DIFFERENTIAL/PLATELET  PROTIME-INR  TROPONIN I  LIPASE, BLOOD  I-STAT TROPONIN, ED    EKG  EKG Interpretation  Date/Time:  Friday August 31 2017 18:56:59 EST Ventricular Rate:  95 PR Interval:    QRS Duration: 102 QT Interval:  374 QTC  Calculation: 471 R Axis:   94 Text Interpretation:  Sinus rhythm Right axis deviation Borderline ST depression, diffuse leads Confirmed by Malvin Johns ([id number]) on 08/31/2017 7:04:25 PM       Radiology Dg Chest Port 1 View  Result Date: 08/31/2017 CLINICAL DATA:  66 year old female with chest pain. EXAM: PORTABLE CHEST 1 VIEW COMPARISON:  Chest CT dated 08/21/2017 FINDINGS: The heart size and mediastinal contours are within normal limits. Both lungs are clear. The visualized skeletal structures are unremarkable. IMPRESSION: No active disease. Electronically Signed   By: Anner Crete M.D.   On: 08/31/2017 19:27    Procedures Procedures (including critical care time)  Medications Ordered in ED Medications  0.9 %  sodium chloride infusion ( Intravenous New Bag/Given 08/31/17 1856)  nitroGLYCERIN (NITROSTAT) SL tablet 0.4 mg (0.4 mg Sublingual Given 08/31/17 1927)  aspirin chewable tablet 324 mg (324 mg Oral Given 08/31/17 1857)  morphine 4 MG/ML injection 4 mg (4 mg Intravenous Given 08/31/17 2052)  ondansetron (ZOFRAN) injection 4 mg (4 mg Intravenous Given 08/31/17 2052)  potassium chloride SA (K-DUR,KLOR-CON) CR tablet 40 mEq (40 mEq Oral Given 08/31/17 2049)     Initial Impression / Assessment and Plan / ED Course  I have reviewed the triage vital signs and the nursing notes.  Pertinent labs & imaging results that were available during my care of the patient were reviewed by me and considered in my medical decision making (see chart for details).     18:40 pt with CP, marked ST depression, subtle ST elevation in aVR, spoke with Dr. Irish Lack who does not recommend cath lab activation at this point.  Orders placed.  18:55 pt having worsening pain, EKG unchanged, given ASA, nitro.  No change in pain after nitro, given morphine, pain improved.  Spoke with Dr. Teena Dunk with cardiology who will admit the patient to [facility]  Started on heparin.  CRITICAL CARE Performed by:  Malvin Johns Total critical care time: 50 minutes Critical care time was exclusive of separately billable procedures and treating other patients. Critical care was necessary to treat or prevent imminent or life-threatening deterioration. Critical care was time spent personally by me on the following activities: development of treatment plan with patient and/or surrogate as well as nursing, discussions with consultants, evaluation  of patient's response to treatment, examination of patient, obtaining history from patient or surrogate, ordering and performing treatments and interventions, ordering and review of laboratory studies, ordering and review of radiographic studies, pulse oximetry and re-evaluation of patient's condition.  Final Clinical Impressions(s) / ED Diagnoses   Final diagnoses:  Unstable angina [facility])  Hypokalemia    ED Discharge Orders    None       Malvin Johns, MD 08/31/17 2145

## 2017-08-31 NOTE — ED Notes (Signed)
Patient continuing to complain of chest pain. One more nitroglycerin 0.4mg  tablet given. Will monitor blood pressure and patient status closely.

## 2017-08-31 NOTE — ED Triage Notes (Signed)
Patient reports chest pain onset of 610 tonight. Pain radiating into neck, left arm and hand.

## 2017-08-31 NOTE — ED Notes (Signed)
ED Provider at bedside. 

## 2017-08-31 NOTE — Progress Notes (Signed)
ANTICOAGULATION CONSULT NOTE - Initial Consult  Pharmacy Consult for heparin Indication: chest pain/ACS  Allergies  Allergen Reactions  . Penicillins Anaphylaxis and Swelling    Has patient had a PCN reaction causing immediate rash, facial/tongue/throat swelling, SOB or lightheadedness with hypotension:Yes Has patient had a PCN reaction causing severe rash involving mucus membranes or skin necrosis:No Has patient had a PCN reaction that required hospitalization:No Has patient had a PCN reaction occurring within the last 10 years:No If all of the above answers are "NO", then may proceed with Cephalosporin use.   . Tetracyclines & Related Hives    Patient Measurements:   Heparin Dosing Weight: 70.3 kg  Vital Signs: BP: 124/64 (12/28 1945) Pulse Rate: 80 (12/28 1945)  Labs: Recent Labs    08/31/17 1852  HGB 14.4  HCT 42.2  PLT 224  APTT 23*  LABPROT 13.4  INR 1.03  CREATININE 0.78  TROPONINI <0.03    CrCl cannot be calculated (Unknown ideal weight.).   Medical History: Past Medical History:  Diagnosis Date  . Arthritis    osteoarthritis knees mostly    Medications:  No anticoagulants PTA  Assessment: 66 yo F with [place].  Pharmacy consulted to dose heparin for chest pain/ACS.   Wt 70 kg, SCr 0.78, CBC WNL.   Goal of Therapy:  Heparin level 0.3-0.7 units/ml Monitor platelets by anticoagulation protocol: Yes   Plan:  Give 4000 units bolus x 1 Start heparin infusion at 850 units/hr Check anti-Xa level in 6 hours and daily while on heparin Continue to monitor H&H and platelets  Eudelia Bunch, Pharm.D. [phone] 08/31/2017 10:08 PM

## 2017-08-31 NOTE — Progress Notes (Addendum)
D/w ED physician who  Also  Discussed  With DR [facility] Pt with unstable  Angina, will admit for  Step down  Pt  Already on  Heparin  And  Nitro  Will evaluate  Pt and  Admission orders  For  [facility] If cp increases, will order  Nitro gtt Trop, echo , orders  Placed,    Patient accepted   Awaiting  Bed  Transfer, care link

## 2017-09-01 ENCOUNTER — Ambulatory Visit (HOSPITAL_COMMUNITY)
Admit: 2017-09-01 | Discharge: 2017-09-01 | Disposition: A | Discharge status: Home / Self Care | Payer: BLUE CROSS/BLUE SHIELD | Attending: Cardiovascular Disease | Admitting: Cardiovascular Disease

## 2017-09-01 ENCOUNTER — Encounter (HOSPITAL_COMMUNITY): Payer: Self-pay | Admitting: Cardiovascular Disease

## 2017-09-01 ENCOUNTER — Other Ambulatory Visit: Payer: Self-pay

## 2017-09-01 ENCOUNTER — Inpatient Hospital Stay (HOSPITAL_COMMUNITY): Payer: BLUE CROSS/BLUE SHIELD

## 2017-09-01 DIAGNOSIS — I200 Unstable angina: Secondary | ICD-10-CM

## 2017-09-01 DIAGNOSIS — R079 Chest pain, unspecified: Secondary | ICD-10-CM

## 2017-09-01 DIAGNOSIS — I10 Essential (primary) hypertension: Secondary | ICD-10-CM

## 2017-09-01 DIAGNOSIS — E876 Hypokalemia: Secondary | ICD-10-CM

## 2017-09-01 DIAGNOSIS — E782 Mixed hyperlipidemia: Secondary | ICD-10-CM | POA: Diagnosis not present

## 2017-09-01 DIAGNOSIS — R9431 Abnormal electrocardiogram [ECG] [EKG]: Secondary | ICD-10-CM

## 2017-09-01 LAB — ECHOCARDIOGRAM COMPLETE
AVPHT: 347 ms
CHL CUP TV REG PEAK VELOCITY: 189 cm/s
E/e' ratio: 6.36
EWDT: 254 ms
FS: 27 % — AB (ref 28–44)
Height: 67 in
IV/PV OW: 1.12
LA ID, A-P, ES: 32 mm
LA vol A4C: 60.2 ml
LA vol index: 33.3 mL/m2
LADIAMINDEX: 1.75 cm/m2
LAVOL: 61 mL
LEFT ATRIUM END SYS DIAM: 32 mm
LV E/e' medial: 6.36
LV e' LATERAL: 12.2 cm/s
LVEEAVG: 6.36
LVOT area: 3.46 cm2
LVOT diameter: 21 mm
MV Dec: 254
MV Peak grad: 2 mmHg
MV pk E vel: 77.6 m/s
MVPKAVEL: 107 m/s
PW: 8.44 mm — AB (ref 0.6–1.1)
RV LATERAL S' VELOCITY: 15.4 cm/s
RV sys press: 17 mmHg
TAPSE: 26.9 mm
TDI e' lateral: 12.2
TDI e' medial: 6.64
TRMAXVEL: 189 cm/s
Weight: 2480 oz

## 2017-09-01 LAB — CBC WITH DIFFERENTIAL/PLATELET
Basophils Absolute: 0 10*3/uL (ref 0.0–0.1)
Basophils Relative: 0 %
EOS ABS: 0 10*3/uL (ref 0.0–0.7)
Eosinophils Relative: 1 %
HEMATOCRIT: 39.1 % (ref 36.0–46.0)
HEMOGLOBIN: 13.1 g/dL (ref 12.0–15.0)
LYMPHS ABS: 1.7 10*3/uL (ref 0.7–4.0)
Lymphocytes Relative: 26 %
MCH: 31 pg (ref 26.0–34.0)
MCHC: 33.5 g/dL (ref 30.0–36.0)
MCV: 92.7 fL (ref 78.0–100.0)
MONOS PCT: 7 %
Monocytes Absolute: 0.5 10*3/uL (ref 0.1–1.0)
NEUTROS ABS: 4.4 10*3/uL (ref 1.7–7.7)
NEUTROS PCT: 66 %
Platelets: 199 10*3/uL (ref 150–400)
RBC: 4.22 MIL/uL (ref 3.87–5.11)
RDW: 12.9 % (ref 11.5–15.5)
WBC: 6.7 10*3/uL (ref 4.0–10.5)

## 2017-09-01 LAB — NM MYOCAR MULTI W/SPECT W/WALL MOTION / EF
CHL CUP MPHR: 155 {beats}/min
CHL CUP RESTING HR STRESS: 60 {beats}/min
CSEPPHR: 110 {beats}/min
Estimated workload: 1 METS
Exercise duration (min): 0 min
Exercise duration (sec): 0 s
Percent HR: 70 %
RPE: 0

## 2017-09-01 LAB — HEPATIC FUNCTION PANEL
ALT: 31 U/L (ref 14–54)
AST: 25 U/L (ref 15–41)
Albumin: 3.5 g/dL (ref 3.5–5.0)
Alkaline Phosphatase: 71 U/L (ref 38–126)
BILIRUBIN DIRECT: 0.1 mg/dL (ref 0.1–0.5)
BILIRUBIN INDIRECT: 0.5 mg/dL (ref 0.3–0.9)
Total Bilirubin: 0.6 mg/dL (ref 0.3–1.2)
Total Protein: 5.8 g/dL — ABNORMAL LOW (ref 6.5–8.1)

## 2017-09-01 LAB — BASIC METABOLIC PANEL
ANION GAP: 6 (ref 5–15)
BUN: 12 mg/dL (ref 6–20)
CALCIUM: 8.2 mg/dL — AB (ref 8.9–10.3)
CO2: 23 mmol/L (ref 22–32)
Chloride: 111 mmol/L (ref 101–111)
Creatinine, Ser: 0.8 mg/dL (ref 0.44–1.00)
GLUCOSE: 101 mg/dL — AB (ref 65–99)
POTASSIUM: 3.8 mmol/L (ref 3.5–5.1)
SODIUM: 140 mmol/L (ref 135–145)

## 2017-09-01 LAB — TSH: TSH: 7.632 u[IU]/mL — ABNORMAL HIGH (ref 0.350–4.500)

## 2017-09-01 LAB — HEMOGLOBIN A1C
Hgb A1c MFr Bld: 5.6 % (ref 4.8–5.6)
Mean Plasma Glucose: 114.02 mg/dL

## 2017-09-01 LAB — TROPONIN I: Troponin I: <0.03 ng/mL (ref <0.03)

## 2017-09-01 LAB — HEPARIN LEVEL (UNFRACTIONATED)
HEPARIN UNFRACTIONATED: 0.85 [IU]/mL — AB (ref 0.30–0.70)
HEPARIN UNFRACTIONATED: 0.16 [IU]/mL — AB (ref 0.30–0.70)

## 2017-09-01 LAB — HIV ANTIBODY (ROUTINE TESTING W REFLEX): HIV SCREEN 4TH GENERATION: NONREACTIVE

## 2017-09-01 MED ORDER — ONDANSETRON HCL 4 MG/2ML IJ SOLN: 4.0000 mg | Freq: Four times a day (QID) | INTRAMUSCULAR | Status: DC | PRN

## 2017-09-01 MED ORDER — ACETAMINOPHEN 325 MG PO TABS: 650.0000 mg | ORAL_TABLET | ORAL | Status: DC | PRN

## 2017-09-01 MED ORDER — REGADENOSON 0.4 MG/5ML IV SOLN
0.4000 mg | Freq: Once | INTRAVENOUS | Status: AC
  Administered 2017-09-01: 0.4 mg via INTRAVENOUS

## 2017-09-01 MED ORDER — ASPIRIN-ACETAMINOPHEN-CAFFEINE 250-250-65 MG PO TABS
2.0000 | ORAL_TABLET | Freq: Four times a day (QID) | ORAL | Status: DC | PRN
  Filled 2017-09-01: qty 2

## 2017-09-01 MED ORDER — ASPIRIN EC 81 MG PO TBEC: 81.0000 mg | DELAYED_RELEASE_TABLET | Freq: Every day | ORAL | Status: DC

## 2017-09-01 MED ORDER — TECHNETIUM TC 99M TETROFOSMIN IV KIT
30.0000 | PACK | Freq: Once | INTRAVENOUS | Status: AC | PRN
  Administered 2017-09-01: 30 via INTRAVENOUS

## 2017-09-01 MED ORDER — HEPARIN (PORCINE) IN NACL 100-0.45 UNIT/ML-% IJ SOLN
850.0000 [IU]/h | INTRAMUSCULAR | Status: DC
  Administered 2017-09-01: 850 [IU]/h via INTRAVENOUS
  Filled 2017-09-01 (×2): qty 250

## 2017-09-01 MED ORDER — ASPIRIN 300 MG RE SUPP: 300.0000 mg | RECTAL | Status: AC

## 2017-09-01 MED ORDER — ACETAMINOPHEN 500 MG PO TABS
1000.0000 mg | ORAL_TABLET | Freq: Three times a day (TID) | ORAL | Status: DC
  Administered 2017-09-01 – 2017-09-03 (×6): 1000 mg via ORAL
  Filled 2017-09-01 (×6): qty 2

## 2017-09-01 MED ORDER — HEPARIN BOLUS VIA INFUSION
2000.0000 [IU] | Freq: Once | INTRAVENOUS | Status: AC
  Administered 2017-09-01: 2000 [IU] via INTRAVENOUS
  Filled 2017-09-01: qty 2000

## 2017-09-01 MED ORDER — HEPARIN (PORCINE) IN NACL 100-0.45 UNIT/ML-% IJ SOLN
750.0000 [IU]/h | INTRAMUSCULAR | Status: DC
  Administered 2017-09-01: 750 [IU]/h via INTRAVENOUS
  Filled 2017-09-01: qty 250

## 2017-09-01 MED ORDER — TECHNETIUM TC 99M TETROFOSMIN IV KIT
10.0000 | PACK | Freq: Once | INTRAVENOUS | Status: AC | PRN
  Administered 2017-09-01: 10 via INTRAVENOUS

## 2017-09-01 MED ORDER — REGADENOSON 0.4 MG/5ML IV SOLN
INTRAVENOUS | Status: AC
  Administered 2017-09-01: 0.4 mg via INTRAVENOUS
  Filled 2017-09-01: qty 5

## 2017-09-01 MED ORDER — ONDANSETRON HCL 4 MG/2ML IJ SOLN
4.0000 mg | Freq: Four times a day (QID) | INTRAMUSCULAR | Status: DC | PRN
  Administered 2017-09-02 – 2017-09-03 (×2): 4 mg via INTRAVENOUS
  Filled 2017-09-01 (×2): qty 2

## 2017-09-01 MED ORDER — LOSARTAN POTASSIUM 25 MG PO TABS
25.0000 mg | ORAL_TABLET | Freq: Every day | ORAL | Status: DC
  Administered 2017-09-01 – 2017-09-03 (×3): 25 mg via ORAL
  Filled 2017-09-01 (×3): qty 1

## 2017-09-01 MED ORDER — PANTOPRAZOLE SODIUM 40 MG PO TBEC
40.0000 mg | DELAYED_RELEASE_TABLET | Freq: Every day | ORAL | Status: DC
  Administered 2017-09-01 – 2017-09-03 (×3): 40 mg via ORAL
  Filled 2017-09-01 (×3): qty 1

## 2017-09-01 MED ORDER — SIMVASTATIN 40 MG PO TABS
40.0000 mg | ORAL_TABLET | Freq: Every evening | ORAL | Status: DC
  Administered 2017-09-01 – 2017-09-03 (×2): 40 mg via ORAL
  Filled 2017-09-01 (×3): qty 1

## 2017-09-01 MED ORDER — ASPIRIN EC 81 MG PO TBEC
81.0000 mg | DELAYED_RELEASE_TABLET | Freq: Every day | ORAL | Status: DC
  Administered 2017-09-02: 81 mg via ORAL
  Filled 2017-09-01: qty 1

## 2017-09-01 MED ORDER — NITROGLYCERIN 0.4 MG SL SUBL: 0.4000 mg | SUBLINGUAL_TABLET | SUBLINGUAL | Status: DC | PRN

## 2017-09-01 MED ORDER — DOCUSATE SODIUM 100 MG PO CAPS
100.0000 mg | ORAL_CAPSULE | Freq: Two times a day (BID) | ORAL | Status: DC
  Administered 2017-09-01 – 2017-09-03 (×3): 100 mg via ORAL
  Filled 2017-09-01 (×4): qty 1

## 2017-09-01 MED ORDER — ASPIRIN 81 MG PO CHEW: 324.0000 mg | CHEWABLE_TABLET | ORAL | Status: AC

## 2017-09-01 MED ORDER — LISINOPRIL 5 MG PO TABS
5.0000 mg | ORAL_TABLET | Freq: Every day | ORAL | Status: DC
  Filled 2017-09-01: qty 1

## 2017-09-01 MED ORDER — FERROUS SULFATE 325 (65 FE) MG PO TABS: 325.0000 mg | ORAL_TABLET | Freq: Three times a day (TID) | ORAL | Status: DC

## 2017-09-01 NOTE — Progress Notes (Signed)
Called and discussed NST findings with the patient and her husband. I personally reviewed her nuclear stress images. There is a subtle reversible anterior wall defect, which could be ischemia or possibly shifting breast artifact. Was read as ischemia by the radiologist. Her echo shows normal wall motion and normal LV function. Symptoms sound concerning for unstable angina. Based on these findings, would recommend definitive cardiac catheterization at [facility]. Bed is now available. Transfer with plan for [facility] on Monday.  Pixie Casino, MD, [facility], [place] Director of the [facility] Lipid [facility] &  Cardiovascular [facility] Reduction [facility] Attending Cardiologist  Direct Dial: [phone]  Fax: [phone]  Website:  [email or web address]

## 2017-09-01 NOTE — ED Notes (Signed)
Pt just return from [facility] after stress text.

## 2017-09-01 NOTE — ED Notes (Signed)
[facility] call for transport.

## 2017-09-01 NOTE — Progress Notes (Signed)
ANTICOAGULATION CONSULT NOTE - f/u Consult  Pharmacy Consult for heparin Indication: chest pain/ACS  Allergies  Allergen Reactions  . Penicillins Anaphylaxis and Swelling    Has patient had a PCN reaction causing immediate rash, facial/tongue/throat swelling, SOB or lightheadedness with hypotension:Yes Has patient had a PCN reaction causing severe rash involving mucus membranes or skin necrosis:No Has patient had a PCN reaction that required hospitalization:No Has patient had a PCN reaction occurring within the last 10 years:No If all of the above answers are "NO", then may proceed with Cephalosporin use.   . Tetracyclines & Related Hives    Patient Measurements: Height: 5\' 7"  (170.2 cm) Weight: 155 lb (70.3 kg) IBW/kg (Calculated) : 61.6 Heparin Dosing Weight: 70.3 kg  Vital Signs: BP: 114/103 (12/29 0530) Pulse Rate: 70 (12/29 0530)  Labs: Recent Labs    08/31/17 1852 09/01/17 0210 09/01/17 0521  HGB 14.4 13.1  --   HCT 42.2 39.1  --   PLT 224 199  --   APTT 23*  --   --   LABPROT 13.4  --   --   INR 1.03  --   --   HEPARINUNFRC  --   --  0.85*  CREATININE 0.78  --   --   TROPONINI <0.03 <0.03  --     Estimated Creatinine Clearance: 68.2 mL/min (by C-G formula based on SCr of 0.78 mg/dL).   Medical History: Past Medical History:  Diagnosis Date  . Arthritis    osteoarthritis knees mostly    Medications:  No anticoagulants PTA  Assessment: 66 yo F with [place].  Pharmacy consulted to dose heparin for chest pain/ACS.   Wt 70 kg, SCr 0.78, CBC WNL.  Today, 12/29 0521 HL=0.85 above goal, no infusion or bleeding issues per RN.   Goal of Therapy:  Heparin level 0.3-0.7 units/ml Monitor platelets by anticoagulation protocol: Yes   Plan:  Decrease heparin drip to 750 units/hr Recheck HL in 6 hours Daily HL and CBC  Dorrene German 09/01/2017 6:10 AM

## 2017-09-01 NOTE — ED Notes (Signed)
ED TO INPATIENT HANDOFF REPORT  Name/Age/Gender [name] 66 y.o. female  Code Status    Code Status Orders  (From admission, onward)        Start     Ordered   09/01/17 0156  Full code  Continuous     09/01/17 0157    Code Status History    Date Active Date Inactive Code Status Order ID Comments User Context   07/07/2016 12:27 07/15/2016 11:38 Full Code [id number]  Elizabeth Sauer Inpatient   07/07/2016 12:27 07/07/2016 12:27 Full Code [id number]  Elizabeth Sauer Inpatient   07/03/2016 14:54 07/07/2016 12:19 Full Code [id number]  Norman Herrlich Inpatient      Home/SNF/Other Home  Chief Complaint Chest Pain  Level of Care/Admitting Diagnosis ED Disposition    ED Disposition Condition [facility] Area: [place] [[id number]]  Level of Care: Stepdown [14]  Diagnosis: Unstable angina [facility]) [[id number]]  Admitting Physician: Johna Sheriff [[id number]]  Attending Physician: Jettie Booze [[id number]]  Estimated length of stay: past midnight tomorrow  Certification:: I certify this patient will need inpatient services for at least 2 midnights  PT Class (Do Not Modify): Inpatient [101]  PT Acc Code (Do Not Modify): Private [1]       Medical History Past Medical History:  Diagnosis Date  . Arthritis    osteoarthritis knees mostly    Allergies Allergies  Allergen Reactions  . Penicillins Anaphylaxis and Swelling    Has patient had a PCN reaction causing immediate rash, facial/tongue/throat swelling, SOB or lightheadedness with hypotension:Yes Has patient had a PCN reaction causing severe rash involving mucus membranes or skin necrosis:No Has patient had a PCN reaction that required hospitalization:No Has patient had a PCN reaction occurring within the last 10 years:No If all of the above answers are "NO", then may proceed with Cephalosporin use.   . Tetracyclines & Related Hives    IV  Location/Drains/Wounds Patient Lines/Drains/Airways Status   Active Line/Drains/Airways    Name:   Placement date:   Placement time:   Site:   Days:   Peripheral IV 08/31/17 Right Hand   08/31/17    1859    Hand   1   Peripheral IV 08/31/17 Right Antecubital   08/31/17    1900    Antecubital   1   Incision (Closed) 07/03/16 Knee Left   07/03/16    1216     425   Incision (Closed) 07/03/16 Knee Right   07/03/16    1216     425          Labs/Imaging Results for orders placed or performed during the hospital encounter of 08/31/17 (from the past 48 hour(s))  CBC with Differential/Platelet     Status: None   Collection Time: 08/31/17  6:52 PM  Result Value Ref Range   WBC 9.1 4.0 - 10.5 K/uL   RBC 4.60 3.87 - 5.11 MIL/uL   Hemoglobin 14.4 12.0 - 15.0 g/dL   HCT 42.2 36.0 - 46.0 %   MCV 91.7 78.0 - 100.0 fL   MCH 31.3 26.0 - 34.0 pg   MCHC 34.1 30.0 - 36.0 g/dL   RDW 12.7 11.5 - 15.5 %   Platelets 224 150 - 400 K/uL   Neutrophils Relative % 61 %   Neutro Abs 5.6 1.7 - 7.7 K/uL   Lymphocytes Relative 29 %   Lymphs Abs 2.6 0.7 - 4.0 K/uL   Monocytes  Relative 9 %   Monocytes Absolute 0.8 0.1 - 1.0 K/uL   Eosinophils Relative 1 %   Eosinophils Absolute 0.1 0.0 - 0.7 K/uL   Basophils Relative 0 %   Basophils Absolute 0.0 0.0 - 0.1 K/uL  Protime-INR     Status: None   Collection Time: 08/31/17  6:52 PM  Result Value Ref Range   Prothrombin Time 13.4 11.4 - 15.2 seconds   INR 1.03   APTT     Status: Abnormal   Collection Time: 08/31/17  6:52 PM  Result Value Ref Range   aPTT 23 (L) 24 - 36 seconds  Comprehensive metabolic panel     Status: Abnormal   Collection Time: 08/31/17  6:52 PM  Result Value Ref Range   Sodium 137 135 - 145 mmol/L   Potassium 2.7 (LL) 3.5 - 5.1 mmol/L    Comment: CRITICAL RESULT CALLED TO, READ BACK BY AND VERIFIED WITH: J.OXINDINE,RN 08/31/17 @2000  BY V.WILKINS    Chloride 107 101 - 111 mmol/L   CO2 17 (L) 22 - 32 mmol/L   Glucose, Bld 121 (H) 65 -  99 mg/dL   BUN 18 6 - 20 mg/dL   Creatinine, Ser 0.78 0.44 - 1.00 mg/dL   Calcium 8.5 (L) 8.9 - 10.3 mg/dL   Total Protein 6.5 6.5 - 8.1 g/dL   Albumin 4.0 3.5 - 5.0 g/dL   AST 38 15 - 41 U/L   ALT 35 14 - 54 U/L   Alkaline Phosphatase 77 38 - 126 U/L   Total Bilirubin 0.5 0.3 - 1.2 mg/dL   GFR calc non Af Amer >60 >60 mL/min   GFR calc Af Amer >60 >60 mL/min    Comment: (NOTE) The eGFR has been calculated using the CKD EPI equation. This calculation has not been validated in all clinical situations. eGFR's persistently <60 mL/min signify possible Chronic Kidney Disease.    Anion gap 13 5 - 15  Troponin I     Status: None   Collection Time: 08/31/17  6:52 PM  Result Value Ref Range   Troponin I <0.03 <0.03 ng/mL  Lipid panel     Status: Abnormal   Collection Time: 08/31/17  6:52 PM  Result Value Ref Range   Cholesterol 189 0 - 200 mg/dL   Triglycerides 60 <150 mg/dL   HDL 72 >40 mg/dL   Total CHOL/HDL Ratio 2.6 RATIO   VLDL 12 0 - 40 mg/dL   LDL Cholesterol 105 (H) 0 - 99 mg/dL    Comment:        Total Cholesterol/HDL:CHD Risk Coronary Heart Disease Risk Table                     Men   Women  1/2 Average Risk   3.4   3.3  Average Risk       5.0   4.4  2 X Average Risk   9.6   7.1  3 X Average Risk  23.4   11.0        Use the calculated Patient Ratio above and the CHD Risk Table to determine the patient's CHD Risk.        ATP III CLASSIFICATION (LDL):  <100     mg/dL   Optimal  100-129  mg/dL   Near or Above                    Optimal  130-159  mg/dL   Borderline  160-189  mg/dL   High  >190     mg/dL   Very High   Lipase, blood     Status: None   Collection Time: 08/31/17  6:52 PM  Result Value Ref Range   Lipase 36 11 - 51 U/L  I-Stat Troponin, ED     Status: None   Collection Time: 08/31/17  7:01 PM  Result Value Ref Range   Troponin i, poc 0.00 0.00 - 0.08 ng/mL   Comment 3            Comment: Due to the release kinetics of cTnI, a negative result  within the first hours of the onset of symptoms does not rule out myocardial infarction with certainty. If myocardial infarction is still suspected, repeat the test at appropriate intervals.   Hepatic function panel     Status: Abnormal   Collection Time: 09/01/17  2:10 AM  Result Value Ref Range   Total Protein 5.8 (L) 6.5 - 8.1 g/dL   Albumin 3.5 3.5 - 5.0 g/dL   AST 25 15 - 41 U/L   ALT 31 14 - 54 U/L   Alkaline Phosphatase 71 38 - 126 U/L   Total Bilirubin 0.6 0.3 - 1.2 mg/dL   Bilirubin, Direct 0.1 0.1 - 0.5 mg/dL   Indirect Bilirubin 0.5 0.3 - 0.9 mg/dL  Troponin I     Status: None   Collection Time: 09/01/17  2:10 AM  Result Value Ref Range   Troponin I <0.03 <0.03 ng/mL  CBC WITH DIFFERENTIAL     Status: None   Collection Time: 09/01/17  2:10 AM  Result Value Ref Range   WBC 6.7 4.0 - 10.5 K/uL   RBC 4.22 3.87 - 5.11 MIL/uL   Hemoglobin 13.1 12.0 - 15.0 g/dL   HCT 39.1 36.0 - 46.0 %   MCV 92.7 78.0 - 100.0 fL   MCH 31.0 26.0 - 34.0 pg   MCHC 33.5 30.0 - 36.0 g/dL   RDW 12.9 11.5 - 15.5 %   Platelets 199 150 - 400 K/uL   Neutrophils Relative % 66 %   Neutro Abs 4.4 1.7 - 7.7 K/uL   Lymphocytes Relative 26 %   Lymphs Abs 1.7 0.7 - 4.0 K/uL   Monocytes Relative 7 %   Monocytes Absolute 0.5 0.1 - 1.0 K/uL   Eosinophils Relative 1 %   Eosinophils Absolute 0.0 0.0 - 0.7 K/uL   Basophils Relative 0 %   Basophils Absolute 0.0 0.0 - 0.1 K/uL  TSH     Status: Abnormal   Collection Time: 09/01/17  2:11 AM  Result Value Ref Range   TSH 7.632 (H) 0.350 - 4.500 uIU/mL    Comment: Performed by a 3rd Generation assay with a functional sensitivity of <=0.01 uIU/mL.  Hemoglobin A1c     Status: None   Collection Time: 09/01/17  2:11 AM  Result Value Ref Range   Hgb A1c MFr Bld 5.6 4.8 - 5.6 %    Comment: (NOTE) Pre diabetes:          5.7%-6.4% Diabetes:              >6.4% Glycemic control for   <7.0% adults with diabetes    Mean Plasma Glucose 114.02 mg/dL    Comment:  Performed at [facility] [address] [place], [place] [address]  Heparin level (unfractionated)     Status: Abnormal   Collection Time: 09/01/17  5:21 AM  Result Value Ref Range  Heparin Unfractionated 0.85 (H) 0.30 - 0.70 IU/mL    Comment:        IF HEPARIN RESULTS ARE BELOW EXPECTED VALUES, AND PATIENT DOSAGE HAS BEEN CONFIRMED, SUGGEST FOLLOW UP TESTING OF ANTITHROMBIN III LEVELS.   Troponin I     Status: None   Collection Time: 09/01/17  7:56 AM  Result Value Ref Range   Troponin I <0.03 <0.03 ng/mL  Basic metabolic panel     Status: Abnormal   Collection Time: 09/01/17  7:56 AM  Result Value Ref Range   Sodium 140 135 - 145 mmol/L   Potassium 3.8 3.5 - 5.1 mmol/L    Comment: DELTA CHECK NOTED   Chloride 111 101 - 111 mmol/L   CO2 23 22 - 32 mmol/L   Glucose, Bld 101 (H) 65 - 99 mg/dL   BUN 12 6 - 20 mg/dL   Creatinine, Ser 0.80 0.44 - 1.00 mg/dL   Calcium 8.2 (L) 8.9 - 10.3 mg/dL   GFR calc non Af Amer >60 >60 mL/min   GFR calc Af Amer >60 >60 mL/min    Comment: (NOTE) The eGFR has been calculated using the CKD EPI equation. This calculation has not been validated in all clinical situations. eGFR's persistently <60 mL/min signify possible Chronic Kidney Disease.    Anion gap 6 5 - 15  Heparin level (unfractionated)     Status: Abnormal   Collection Time: 09/01/17  1:42 PM  Result Value Ref Range   Heparin Unfractionated 0.16 (L) 0.30 - 0.70 IU/mL    Comment:        IF HEPARIN RESULTS ARE BELOW EXPECTED VALUES, AND PATIENT DOSAGE HAS BEEN CONFIRMED, SUGGEST FOLLOW UP TESTING OF ANTITHROMBIN III LEVELS.    Nm Myocar Multi W/spect W/wall Motion / Ef  Result Date: 09/01/2017 CLINICAL DATA:  Chest pain. EXAM: MYOCARDIAL IMAGING WITH SPECT (REST AND PHARMACOLOGIC-STRESS) GATED LEFT VENTRICULAR WALL MOTION STUDY LEFT VENTRICULAR EJECTION FRACTION TECHNIQUE: Standard myocardial SPECT imaging was performed after resting intravenous injection of 10 mCi  [id number]. Subsequently, intravenous infusion of Lexiscan was performed under the supervision of the Cardiology staff. At peak effect of the drug, 30 mCi [id number] was injected intravenously and standard myocardial SPECT imaging was performed. Quantitative gated imaging was also performed to evaluate left ventricular wall motion, and estimate left ventricular ejection fraction. COMPARISON:  Chest radiograph 08/31/2017.  Chest CT 08/21/2017. FINDINGS: Perfusion: No rest defects. With stress there is an area of mild reversibility which is medium in size and involves the mid to basilar segment of the anteroseptal wall. Wall Motion: Normal left ventricular wall motion. No left ventricular dilation. Left Ventricular Ejection Fraction: 66 % End diastolic volume 10347l End systolic volume 35 ml IMPRESSION: 1. Medium size area of reversibility involving the mid to basilar segment of the anteroseptal wall. Suspicious for inducible ischemia. 2. Normal left ventricular wall motion. 3. Left ventricular ejection fraction 66% 4. Non invasive risk stratification*: Intermediate *2012 Appropriate Use Criteria for Coronary Revascularization Focused Update: J Am Coll Cardiol. 204259;56(3):875-643http://content.onairportbarriers.comspx?articleid=1201161 These results will be called to the ordering clinician or representative by the Radiologist Assistant, and communication documented in the PACS or zVision Dashboard. Electronically Signed   By: KyAbigail Miyamoto.D.   On: 09/01/2017 13:36   Dg Chest Port 1 View  Result Date: 08/31/2017 CLINICAL DATA:  [age]ear old female with chest pain. EXAM: PORTABLE CHEST 1 VIEW COMPARISON:  Chest CT dated 08/21/2017 FINDINGS: The heart size and mediastinal contours are within normal  limits. Both lungs are clear. The visualized skeletal structures are unremarkable. IMPRESSION: No active disease. Electronically Signed   By: Anner Crete M.D.   On: 08/31/2017 19:27     Pending Labs Unresulted Labs (From admission, onward)   Start     Ordered   09/02/17 0500  CBC  Daily,   R     09/01/17 0208   09/01/17 0156  HIV antibody (Routine Testing)  Once,   R     09/01/17 0157   09/01/17 0155  Troponin I  Now then every 6 hours,   STAT     09/01/17 0157      Vitals/Pain Today's Vitals   09/01/17 0830 09/01/17 0900 09/01/17 0930 09/01/17 1315  BP: 128/74 130/75 122/77 117/66  Pulse:   (!) 57 73  Resp: 16 19 17 17   SpO2:   100% 100%  Weight:      Height:      PainSc:        Isolation Precautions No active isolations  Medications Medications  0.9 %  sodium chloride infusion ( Intravenous Stopped 09/01/17 0348)  nitroGLYCERIN (NITROSTAT) SL tablet 0.4 mg (0.4 mg Sublingual Given 08/31/17 1927)  acetaminophen (TYLENOL) tablet 1,000 mg (1,000 mg Oral Given 09/01/17 0528)  docusate sodium (COLACE) capsule 100 mg (100 mg Oral Refused 09/01/17 1323)  aspirin-acetaminophen-caffeine (EXCEDRIN MIGRAINE) per tablet 2 tablet (not administered)  pantoprazole (PROTONIX) EC tablet 40 mg (40 mg Oral Given 09/01/17 1322)  simvastatin (ZOCOR) tablet 40 mg (not administered)  acetaminophen (TYLENOL) tablet 650 mg (not administered)  ondansetron (ZOFRAN) injection 4 mg (not administered)  aspirin chewable tablet 324 mg (324 mg Oral Not Given 09/01/17 0204)    Or  aspirin suppository 300 mg ( Rectal See Alternative 09/01/17 0204)  aspirin EC tablet 81 mg (not administered)  nitroGLYCERIN (NITROSTAT) SL tablet 0.4 mg (not administered)  acetaminophen (TYLENOL) tablet 650 mg (not administered)  ondansetron (ZOFRAN) injection 4 mg (not administered)  losartan (COZAAR) tablet 25 mg (25 mg Oral Given 09/01/17 0958)  heparin ADULT infusion 100 units/mL (25000 units/251m sodium chloride 0.45%) (not administered)  heparin bolus via infusion 2,000 Units (not administered)  aspirin chewable tablet 324 mg (324 mg Oral Given 08/31/17 1857)  morphine 4 MG/ML injection 4  mg (4 mg Intravenous Given 08/31/17 2052)  ondansetron (ZOFRAN) injection 4 mg (4 mg Intravenous Given 08/31/17 2052)  potassium chloride SA (K-DUR,KLOR-CON) CR tablet 40 mEq (40 mEq Oral Given 08/31/17 2049)  heparin bolus via infusion 4,000 Units (4,000 Units Intravenous Bolus from Bag 08/31/17 2304)    Mobility walks

## 2017-09-01 NOTE — Progress Notes (Signed)
lexiscan portion completed without complications nuc results to follow

## 2017-09-01 NOTE — Progress Notes (Signed)
Dr. Debara Pickett discussed results of nuc study and need to transfer to [facility] for cath on Monday.  They are willing to proceed.   We need to discuss cath at length.

## 2017-09-01 NOTE — Progress Notes (Signed)
  Echocardiogram 2D Echocardiogram has been performed.  Nnamdi Dacus G Ameia Morency 09/01/2017, 9:10 AM

## 2017-09-01 NOTE — ED Notes (Signed)
Bed: WA14 Expected date:  Expected time:  Means of arrival:  Comments: 

## 2017-09-01 NOTE — H&P (Addendum)
Cardiology Admission History and Physical:   Patient ID: [name]; MRN: [id number]; DOB: 04/21/1951   Admission date: 08/31/2017  Primary Care Provider: [name], MD Primary Cardiologist:  New, Syncere Eble  Primary Electrophysiologist:  None   Chief Complaint:  Unstable angina   Patient Profile:   [name] is a 66 y.o. female with a history of arthritis who is admitted to the hospital with several weeks of progressive anginal-like symptoms.  History of Present Illness:   [name] is a 66 y.o. female with a history of arthritis who is admitted to the hospital with several weeks of progressive anginal-like symptoms.  2 weeks of upper abdominal/lower chest pain. Pains been intermittent for the past several weeks.  It tends to last for several minutes.  It centered in the middle lower aspect of her chest and radiates through to her back and between her shoulder blades.  It is associated with some diaphoresis and some generalized dizziness.  There is no radiation to the arm.  The pain is not worsened by deep breath, twisting or turning, or exercise.  She does not get a lot of exercise recently because of bilateral knee replacements.  She was found to have mild hypertension and was started on lisinopril.  She has developed a slight dry hacking cough since that time.  She was also found to have mild hypercholesterolemia and was started on simvastatin 40 mg a day.  She has a history of bilateral knee replacements.  She is on Xarelto 10 mg a day for DVT prophylaxis.  Yesterday she had recurrent episodes of chest discomfort while at the movies.  She went to dinner and the pain was still present.  She presented to [facility] hospital for further evaluation.  She I  received sublingual nitroglycerin.  She is currently pain-free.    Past Medical History:  Diagnosis Date  . Arthritis    osteoarthritis knees mostly    Past Surgical History:  Procedure Laterality Date  .  DIAGNOSTIC LAPAROSCOPY     ovarian cystectomy/ BSO done.  Marland Kitchen HARDWARE REMOVAL Left 07/03/2016   Procedure: LEFT KNEE HARDWARE REMOVAL;  Surgeon: Paralee Cancel, MD;  Location: [facility];  Service: Orthopedics;  Laterality: Left;  . KNEE SURGERY Bilateral    ACL x2, meniscectomy x3, open patella(bone from other knee)  . ROTATOR CUFF REPAIR Right   . TOTAL KNEE ARTHROPLASTY Bilateral 07/03/2016   Procedure: TOTAL KNEE BILATERAL ARTHROPLASTY;  Surgeon: Paralee Cancel, MD;  Location: [facility];  Service: Orthopedics;  Laterality: Bilateral;     Medications Prior to Admission: Prior to Admission medications   Medication Sig Start Date End Date Taking? Authorizing Provider  aspirin EC 81 MG tablet Take 81 mg by mouth at bedtime.   Yes [provider]  aspirin-acetaminophen-caffeine (EXCEDRIN MIGRAINE) [phone] MG tablet Take 2 tablets by mouth every 6 (six) hours as needed for headache.   Yes [provider]  Cholecalciferol 1000 units tablet Take 1,000 Units by mouth 2 (two) times daily.   Yes [provider]  lisinopril (PRINIVIL,ZESTRIL) 5 MG tablet Take 5 mg by mouth daily. 08/20/17  Yes [provider]  omeprazole (PRILOSEC) 20 MG capsule Take 20 mg by mouth daily. 08/20/17  Yes [provider]  polyethylene glycol (MIRALAX / GLYCOLAX) packet Take 17 g by mouth 2 (two) times daily. 07/07/16  Yes Babish, Rodman Key, PA-C  simvastatin (ZOCOR) 40 MG tablet Take 40 mg by mouth every evening. 08/20/17  Yes [provider]  acetaminophen (TYLENOL) 500 MG tablet Take 2 tablets (1,000 mg total) by mouth every 8 (eight) hours. Patient not taking: Reported on 08/31/2017 07/07/16   Danae Orleans, PA-C  celecoxib (CELEBREX) 200 MG capsule Take 1 capsule (200 mg total) by mouth every 12 (twelve) hours. Patient not taking: Reported on 08/31/2017 07/14/16   Angiulli, Lavon Paganini, PA-C  docusate sodium (COLACE) 100 MG capsule Take 1 capsule (100 mg total) by mouth 2  (two) times daily. Patient not taking: Reported on 08/31/2017 07/07/16   Danae Orleans, PA-C  ferrous sulfate 325 (65 FE) MG tablet Take 1 tablet (325 mg total) by mouth 3 (three) times daily after meals. Patient not taking: Reported on 08/31/2017 07/14/16   Angiulli, Lavon Paganini, PA-C  methocarbamol (ROBAXIN) 500 MG tablet Take 1 tablet (500 mg total) by mouth every 6 (six) hours as needed for muscle spasms. Patient not taking: Reported on 08/31/2017 07/14/16   Angiulli, Lavon Paganini, PA-C  oxyCODONE 10 MG TABS Take 1 tablet (10 mg total) by mouth every 4 (four) hours as needed for moderate pain. Patient not taking: Reported on 08/31/2017 07/14/16   Angiulli, Lavon Paganini, PA-C  rivaroxaban (XARELTO) 10 MG TABS tablet Take 1 tablet (10 mg total) by mouth daily. 07/14/16 07/28/16  Angiulli, Lavon Paganini, PA-C     Allergies:    Allergies  Allergen Reactions  . Penicillins Anaphylaxis and Swelling    Has patient had a PCN reaction causing immediate rash, facial/tongue/throat swelling, SOB or lightheadedness with hypotension:Yes Has patient had a PCN reaction causing severe rash involving mucus membranes or skin necrosis:No Has patient had a PCN reaction that required hospitalization:No Has patient had a PCN reaction occurring within the last 10 years:No If all of the above answers are "NO", then may proceed with Cephalosporin use.   . Tetracyclines & Related Hives    Social History:   Social History   Socioeconomic History  . Marital status: Married    Spouse name: Not on file  . Number of children: Not on file  . Years of education: Not on file  . Highest education level: Not on file  Social Needs  . Financial resource strain: Not on file  . Food insecurity - worry: Not on file  . Food insecurity - inability: Not on file  . Transportation needs - medical: Not on file  . Transportation needs - non-medical: Not on file  Occupational History  . Not on file  Tobacco Use  . Smoking status:  Never Smoker  . Smokeless tobacco: Never Used  Substance and Sexual Activity  . Alcohol use: Yes    Comment: social x3-4 week  . Drug use: No  . Sexual activity: Yes    Birth control/protection: None  Other Topics Concern  . Not on file  Social History Narrative  . Not on file    Family History:   The patient's family history includes Breast cancer in her mother; Heart attack in her paternal grandfather; Stroke in her father.    ROS:  Please see the history of present illness.  All other ROS reviewed and negative.     Physical Exam/Data:   Vitals:   09/01/17 0515 09/01/17 0530 09/01/17 0600 09/01/17 0630  BP: 125/79 (!) 114/103 129/76 125/74  Pulse: (!) 59 70 61 63  Resp: 16 (!) 24 14 11   SpO2: 97% 98% 98% 100%  Weight:      Height:        Intake/Output Summary (Last 24  hours) at 09/01/2017 0817 Last data filed at 09/01/2017 0348 Gross per 24 hour  Intake 1000 ml  Output -  Net 1000 ml   Filed Weights   08/31/17 2158  Weight: 155 lb (70.3 kg)   Body mass index is 24.28 kg/m.  General:  Well nourished, well developed, in no acute distress HEENT: normal Lymph: no adenopathy Neck: no JVD Endocrine:  No thryomegaly Vascular: No carotid bruits; FA pulses 2+ bilaterally without bruits  Cardiac:  normal S1, S2; RRR;  Soft systolic murmur  Lungs:  clear to auscultation bilaterally, no wheezing, rhonchi or rales  Abd: soft, nontender, no hepatomegaly  Ext: no edema Musculoskeletal:  No deformities, BUE and BLE strength normal and equal Skin: warm and dry  Neuro:  CNs 2-12 intact, no focal abnormalities noted Psych:  Normal affect    EKG:  The ECG that was done  was personally reviewed and demonstrates NSR with diffuse and nonspecific mild St depression   Relevant CV Studies: Negative CT angio of the chest a week or so ago - pulmonary embolus ruled out.   Laboratory Data:  Chemistry Recent Labs  Lab 08/31/17 1852  NA 137  K 2.7*  CL 107  CO2 17*    GLUCOSE 121*  BUN 18  CREATININE 0.78  CALCIUM 8.5*  GFRNONAA >60  GFRAA >60  ANIONGAP 13    Recent Labs  Lab 08/31/17 1852 09/01/17 0210  PROT 6.5 5.8*  ALBUMIN 4.0 3.5  AST 38 25  ALT 35 31  ALKPHOS 77 71  BILITOT 0.5 0.6   Hematology Recent Labs  Lab 08/31/17 1852 09/01/17 0210  WBC 9.1 6.7  RBC 4.60 4.22  HGB 14.4 13.1  HCT 42.2 39.1  MCV 91.7 92.7  MCH 31.3 31.0  MCHC 34.1 33.5  RDW 12.7 12.9  PLT 224 199   Cardiac Enzymes Recent Labs  Lab 08/31/17 1852 09/01/17 0210  TROPONINI <0.03 <0.03    Recent Labs  Lab 08/31/17 1901  TROPIPOC 0.00    BNPNo results for input(s): BNP, PROBNP in the last 168 hours.  DDimer No results for input(s): DDIMER in the last 168 hours.  Radiology/Studies:  Dg Chest Port 1 View  Result Date: 08/31/2017 CLINICAL DATA:  66 year old female with chest pain. EXAM: PORTABLE CHEST 1 VIEW COMPARISON:  Chest CT dated 08/21/2017 FINDINGS: The heart size and mediastinal contours are within normal limits. Both lungs are clear. The visualized skeletal structures are unremarkable. IMPRESSION: No active disease. Electronically Signed   By: Anner Crete M.D.   On: 08/31/2017 19:27    Assessment and Plan:   1. Possible unstable angina: Patient presents with episodes of chest discomfort that radiates through to her back.  These are associated with some lightheadedness and some diaphoresis.  Her cardiac enzymes are negative so far.  Her EKG shows some very mild ST segment abnormalities but these are relatively nonspecific.  She is currently pain-free.  We will schedule her for a stress Myoview study today.  2.  Hypokalemia: Potassium level was normal last year.  We will repeat the level.  This could be lab error.   She has lisinopril listed as 1 of her medications.  She does not have a diuretic listed but it is possible that she has been taking a diuretic which is because this hypokalemia.  3.  Hyperlipidemia: Continue simvastatin  for now.  4.  History of bilateral knee replacements: Continue Xarelto 10 mg for now.  5.  Hypertension: She reports  having a slight cough with lisinopril.  We will change her to losartan 25  mg a day.   Severity of Illness: The appropriate patient status for this patient is OBSERVATION. Observation status is judged to be reasonable and necessary in order to provide the required intensity of service to ensure the patient's safety. The patient's presenting symptoms, physical exam findings, and initial radiographic and laboratory data in the context of their medical condition is felt to place them at decreased risk for further clinical deterioration. Furthermore, it is anticipated that the patient will be medically stable for discharge from the hospital within 2 midnights of admission. The following factors support the patient status of observation.   " The patient's presenting symptoms include chest pain . " The physical exam findings include none . " The initial radiographic and laboratory data are .     For questions or updates, please contact [place] Please consult [email or web address] for contact info under Cardiology/STEMI.    Signed, Mertie Moores, MD  09/01/2017 8:17 AM

## 2017-09-01 NOTE — Progress Notes (Signed)
ANTICOAGULATION CONSULT NOTE - f/u Consult  Pharmacy Consult for heparin Indication: chest pain/ACS  Allergies  Allergen Reactions  . Penicillins Anaphylaxis and Swelling    Has patient had a PCN reaction causing immediate rash, facial/tongue/throat swelling, SOB or lightheadedness with hypotension:Yes Has patient had a PCN reaction causing severe rash involving mucus membranes or skin necrosis:No Has patient had a PCN reaction that required hospitalization:No Has patient had a PCN reaction occurring within the last 10 years:No If all of the above answers are "NO", then may proceed with Cephalosporin use.   . Tetracyclines & Related Hives    Patient Measurements: Height: 5\' 7"  (170.2 cm) Weight: 155 lb (70.3 kg) IBW/kg (Calculated) : 61.6 Heparin Dosing Weight: 70.3 kg  Vital Signs: BP: 117/66 (12/29 1315) Pulse Rate: 73 (12/29 1315)  Labs: Recent Labs    08/31/17 1852 09/01/17 0210 09/01/17 0521 09/01/17 0756 09/01/17 1342  HGB 14.4 13.1  --   --   --   HCT 42.2 39.1  --   --   --   PLT 224 199  --   --   --   APTT 23*  --   --   --   --   LABPROT 13.4  --   --   --   --   INR 1.03  --   --   --   --   HEPARINUNFRC  --   --  0.85*  --  0.16*  CREATININE 0.78  --   --  0.80  --   TROPONINI <0.03 <0.03  --  <0.03  --     Estimated Creatinine Clearance: 68.2 mL/min (by C-G formula based on SCr of 0.8 mg/dL).   Medical History: Past Medical History:  Diagnosis Date  . Arthritis    osteoarthritis knees mostly    Medications:  No anticoagulants PTA  Assessment: 66 yo F with [place].  Pharmacy consulted to dose heparin for chest pain/ACS.   Wt 70 kg, SCr 0.78, CBC WNL.  Today, 12/29 0521 HL=0.85 above goal, no infusion or bleeding issues per RN.  1343 HL is 0.16, subtherapeutic, no line issues or bleeding issues per RN   Goal of Therapy:  Heparin level 0.3-0.7 units/ml Monitor platelets by anticoagulation protocol: Yes   Plan:  Heparin 2000 unit  bolus Increase heparin drip to 850 units/hr Recheck HL in 6 hours Daily HL and CBC   Royetta Asal, PharmD, BCPS Pager [phone] 09/01/2017 3:11 PM

## 2017-09-02 ENCOUNTER — Other Ambulatory Visit: Payer: Self-pay

## 2017-09-02 DIAGNOSIS — I200 Unstable angina: Secondary | ICD-10-CM | POA: Diagnosis not present

## 2017-09-02 LAB — CBC
HEMATOCRIT: 41.9 % (ref 36.0–46.0)
HEMOGLOBIN: 13.6 g/dL (ref 12.0–15.0)
MCH: 30.8 pg (ref 26.0–34.0)
MCHC: 32.5 g/dL (ref 30.0–36.0)
MCV: 95 fL (ref 78.0–100.0)
Platelets: 181 10*3/uL (ref 150–400)
RBC: 4.41 MIL/uL (ref 3.87–5.11)
RDW: 13.2 % (ref 11.5–15.5)
WBC: 5.5 10*3/uL (ref 4.0–10.5)

## 2017-09-02 LAB — HEPARIN LEVEL (UNFRACTIONATED)
HEPARIN UNFRACTIONATED: 0.68 [IU]/mL (ref 0.30–0.70)
HEPARIN UNFRACTIONATED: 0.76 [IU]/mL — AB (ref 0.30–0.70)

## 2017-09-02 LAB — TROPONIN I: Troponin I: <0.03 ng/mL (ref <0.03)

## 2017-09-02 MED ORDER — SODIUM CHLORIDE 0.9% FLUSH: 3.0000 mL | INTRAVENOUS | Status: DC | PRN

## 2017-09-02 MED ORDER — SODIUM CHLORIDE 0.9 % WEIGHT BASED INFUSION: 1.0000 mL/kg/h | INTRAVENOUS | Status: DC

## 2017-09-02 MED ORDER — SODIUM CHLORIDE 0.9% FLUSH: 3.0000 mL | Freq: Two times a day (BID) | INTRAVENOUS | Status: DC

## 2017-09-02 MED ORDER — SODIUM CHLORIDE 0.9 % WEIGHT BASED INFUSION: 3.0000 mL/kg/h | INTRAVENOUS | Status: DC

## 2017-09-02 MED ORDER — NITROGLYCERIN IN D5W 200-5 MCG/ML-% IV SOLN
0.0000 ug/min | INTRAVENOUS | Status: DC
  Administered 2017-09-02: 10 ug/min via INTRAVENOUS

## 2017-09-02 MED ORDER — SODIUM CHLORIDE 0.9 % IV SOLN: 250.0000 mL | INTRAVENOUS | Status: DC | PRN

## 2017-09-02 MED ORDER — SODIUM CHLORIDE 0.9 % WEIGHT BASED INFUSION
1.0000 mL/kg/h | INTRAVENOUS | Status: DC
  Administered 2017-09-03: 1 mL/kg/h via INTRAVENOUS

## 2017-09-02 MED ORDER — ASPIRIN 81 MG PO CHEW
81.0000 mg | CHEWABLE_TABLET | ORAL | Status: AC
  Administered 2017-09-03: 81 mg via ORAL
  Filled 2017-09-02: qty 1

## 2017-09-02 MED ORDER — SODIUM CHLORIDE 0.9% FLUSH
3.0000 mL | Freq: Two times a day (BID) | INTRAVENOUS | Status: DC
Start: 2017-09-02 — End: 2017-09-03

## 2017-09-02 MED ORDER — SODIUM CHLORIDE 0.9 % WEIGHT BASED INFUSION
3.0000 mL/kg/h | INTRAVENOUS | Status: DC
  Administered 2017-09-03: 3 mL/kg/h via INTRAVENOUS

## 2017-09-02 MED ORDER — HEPARIN (PORCINE) IN NACL 100-0.45 UNIT/ML-% IJ SOLN
750.0000 [IU]/h | INTRAMUSCULAR | Status: DC
  Administered 2017-09-03: 750 [IU]/h via INTRAVENOUS
  Filled 2017-09-02: qty 250

## 2017-09-02 MED ORDER — NITROGLYCERIN IN D5W 200-5 MCG/ML-% IV SOLN
INTRAVENOUS | Status: AC
  Administered 2017-09-02: 10 ug/min via INTRAVENOUS
  Filled 2017-09-02: qty 250

## 2017-09-02 MED ORDER — ASPIRIN 81 MG PO CHEW: 81.0000 mg | CHEWABLE_TABLET | ORAL | Status: DC

## 2017-09-02 NOTE — Progress Notes (Signed)
Progress Note  Patient Name: [name] Date of Encounter: 09/02/2017  Primary Cardiologist: New, Nahser   Subjective   66 year old female with a history of arthritis who was admitted to [facility] with anginal-like symptoms.  She had a stress Myoview study yesterday which revealed some defects and she was transferred to [facility] in anticipation for having a heart catheterization on Monday.  Troponin levels remain negative.  She is on IV heparin.   Inpatient Medications    Scheduled Meds: . acetaminophen  1,000 mg Oral Q8H  . aspirin EC  81 mg Oral Daily  . docusate sodium  100 mg Oral BID  . losartan  25 mg Oral Daily  . pantoprazole  40 mg Oral Daily  . simvastatin  40 mg Oral QPM   Continuous Infusions: . sodium chloride Stopped (09/01/17 0348)  . heparin     PRN Meds: acetaminophen, aspirin-acetaminophen-caffeine, nitroGLYCERIN, ondansetron (ZOFRAN) IV   Vital Signs    Vitals:   09/01/17 1627 09/01/17 2339 09/02/17 0407 09/02/17 0806  BP: (!) 143/87 123/87 119/76 106/62  Pulse: 66 68 72 66  Resp: 17 17 (!) 21 18  Temp: 98.4 F (36.9 C) 97.6 F (36.4 C) 97.7 F (36.5 C) 98.1 F (36.7 C)  TempSrc: Oral Oral Oral Oral  SpO2: 98% 99% 100% 95%  Weight: 156 lb 4.9 oz (70.9 kg)     Height: 5\' 7"  (1.702 m)       Intake/Output Summary (Last 24 hours) at 09/02/2017 1114 Last data filed at 09/02/2017 0800 Gross per 24 hour  Intake 582 ml  Output -  Net 582 ml   Filed Weights   08/31/17 2158 09/01/17 1600 09/01/17 1627  Weight: 155 lb (70.3 kg) 156 lb 4.9 oz (70.9 kg) 156 lb 4.9 oz (70.9 kg)    Telemetry    NSR  - Personally Reviewed  ECG     NSR  - Personally Reviewed  Physical Exam   GEN: No acute distress.   Neck: No JVD Cardiac: RRR,  Soft systolic murmur  Respiratory: Clear to auscultation bilaterally. GI: Soft, nontender, non-distended  MS: No edema; No deformity. Neuro:  Nonfocal  Psych: Normal affect   Labs      Chemistry Recent Labs  Lab 08/31/17 1852 09/01/17 0210 09/01/17 0756  NA 137  --  140  K 2.7*  --  3.8  CL 107  --  111  CO2 17*  --  23  GLUCOSE 121*  --  101*  BUN 18  --  12  CREATININE 0.78  --  0.80  CALCIUM 8.5*  --  8.2*  PROT 6.5 5.8*  --   ALBUMIN 4.0 3.5  --   AST 38 25  --   ALT 35 31  --   ALKPHOS 77 71  --   BILITOT 0.5 0.6  --   GFRNONAA >60  --  >60  GFRAA >60  --  >60  ANIONGAP 13  --  6     Hematology Recent Labs  Lab 08/31/17 1852 09/01/17 0210 09/02/17 0238  WBC 9.1 6.7 5.5  RBC 4.60 4.22 4.41  HGB 14.4 13.1 13.6  HCT 42.2 39.1 41.9  MCV 91.7 92.7 95.0  MCH 31.3 31.0 30.8  MCHC 34.1 33.5 32.5  RDW 12.7 12.9 13.2  PLT 224 199 181    Cardiac Enzymes Recent Labs  Lab 08/31/17 1852 09/01/17 0210 09/01/17 0756  TROPONINI <0.03 <0.03 <0.03    Recent  Labs  Lab 08/31/17 1901  TROPIPOC 0.00     BNPNo results for input(s): BNP, PROBNP in the last 168 hours.   DDimer No results for input(s): DDIMER in the last 168 hours.   Radiology    Nm Myocar Multi W/spect W/wall Motion / Ef  Result Date: 09/01/2017 CLINICAL DATA:  Chest pain. EXAM: MYOCARDIAL IMAGING WITH SPECT (REST AND PHARMACOLOGIC-STRESS) GATED LEFT VENTRICULAR WALL MOTION STUDY LEFT VENTRICULAR EJECTION FRACTION TECHNIQUE: Standard myocardial SPECT imaging was performed after resting intravenous injection of 10 mCi Tc-45m tetrofosmin. Subsequently, intravenous infusion of Lexiscan was performed under the supervision of the Cardiology staff. At peak effect of the drug, 30 mCi Tc-75m tetrofosmin was injected intravenously and standard myocardial SPECT imaging was performed. Quantitative gated imaging was also performed to evaluate left ventricular wall motion, and estimate left ventricular ejection fraction. COMPARISON:  Chest radiograph 08/31/2017.  Chest CT 08/21/2017. FINDINGS: Perfusion: No rest defects. With stress there is an area of mild reversibility which is medium in size and  involves the mid to basilar segment of the anteroseptal wall. Wall Motion: Normal left ventricular wall motion. No left ventricular dilation. Left Ventricular Ejection Fraction: 66 % End diastolic volume 353 ml End systolic volume 35 ml IMPRESSION: 1. Medium size area of reversibility involving the mid to basilar segment of the anteroseptal wall. Suspicious for inducible ischemia. 2. Normal left ventricular wall motion. 3. Left ventricular ejection fraction 66% 4. Non invasive risk stratification*: Intermediate *2012 Appropriate Use Criteria for Coronary Revascularization Focused Update: J Am Coll Cardiol. 2992;42(6):834-196. http://content.airportbarriers.com.aspx?articleid=[email or web address] These results will be called to the ordering clinician or representative by the Radiologist Assistant, and communication documented in the PACS or zVision Dashboard. Electronically Signed   By: Abigail Miyamoto M.D.   On: 09/01/2017 13:36   Dg Chest Port 1 View  Result Date: 08/31/2017 CLINICAL DATA:  66 year old female with chest pain. EXAM: PORTABLE CHEST 1 VIEW COMPARISON:  Chest CT dated 08/21/2017 FINDINGS: The heart size and mediastinal contours are within normal limits. Both lungs are clear. The visualized skeletal structures are unremarkable. IMPRESSION: No active disease. Electronically Signed   By: Anner Crete M.D.   On: 08/31/2017 19:27    Cardiac Studies   Myoview   IMPRESSION: 1. Medium size area of reversibility involving the mid to basilar segment of the anteroseptal wall. Suspicious for inducible ischemia.  2. Normal left ventricular wall motion.  3. Left ventricular ejection fraction 66%  Patient Profile     66 y.o. female admitted with symptoms of angina.  Assessment & Plan    1.  Possible coronary artery disease: Patient presented with episodes of chest discomfort.  Myoview study reveals a medium sized area of reversible ischemia of the anterior wall.  This could be shifting breast  artifact but also could be ischemia.   The decision was made to proceed with cath on Monday  We have discussed the risks, benefits, and options concerning heart catheterization.  She understands and agrees to proceed.  For questions or updates, please contact [place] Please consult [email or web address] for contact info under Cardiology/STEMI.      Signed, Mertie Moores, MD  09/02/2017, 11:14 AM

## 2017-09-02 NOTE — Progress Notes (Signed)
ANTICOAGULATION CONSULT NOTE - Follow Up Consult  Pharmacy Consult for heparin (PTA Xarelto on hold) Indication: chest pain/ACS  Allergies  Allergen Reactions  . Penicillins Anaphylaxis and Swelling    Has patient had a PCN reaction causing immediate rash, facial/tongue/throat swelling, SOB or lightheadedness with hypotension:Yes Has patient had a PCN reaction causing severe rash involving mucus membranes or skin necrosis:No Has patient had a PCN reaction that required hospitalization:No Has patient had a PCN reaction occurring within the last 10 years:No If all of the above answers are "NO", then may proceed with Cephalosporin use.   . Tetracyclines & Related Hives   Patient Measurements: Height: 5\' 7"  (170.2 cm) Weight: 156 lb 4.9 oz (70.9 kg) IBW/kg (Calculated) : 61.6 Heparin Dosing Weight: 70.9 kg  Vital Signs: Temp: 98.1 F (36.7 C) (12/30 0806) Temp Source: Oral (12/30 0806) BP: 106/62 (12/30 0806) Pulse Rate: 66 (12/30 0806)  Labs: Recent Labs    08/31/17 1852 09/01/17 0210  09/01/17 0756 09/01/17 1342 09/02/17 0238 09/02/17 0841  HGB 14.4 13.1  --   --   --  13.6  --   HCT 42.2 39.1  --   --   --  41.9  --   PLT 224 199  --   --   --  181  --   APTT 23*  --   --   --   --   --   --   LABPROT 13.4  --   --   --   --   --   --   INR 1.03  --   --   --   --   --   --   HEPARINUNFRC  --   --    < >  --  0.16* 0.68 0.76*  CREATININE 0.78  --   --  0.80  --   --   --   TROPONINI <0.03 <0.03  --  <0.03  --   --   --    < > = values in this interval not displayed.    Estimated Creatinine Clearance: 68.2 mL/min (by C-G formula based on SCr of 0.8 mg/dL).  Medications: Heparin @ 850 units/hr  Assessment: 66 yo F started on heparin for unstable angina. [facility]red [facility] to [facility] for cardiology evaluation.   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 Initial aPTT was normal and heparin  level has trended down to subtherapeutic (0.16) prior to transfer to [facility]. Will use heparin levels for dosing.   Heparin level is slightly above goal today at 0.76. CBC Stable. No bleeding. Stress test with possible ischemia. Noted plan for cath tomorrow.  Goal of Therapy:  Heparin level 0.3-0.7 units/ml Monitor platelets by anticoagulation protocol: Yes   Plan:  1) Decrease heparin to 750 units/hr 2) Daily heparin level and CBC   Nena Jordan, PharmD, BCPS 09/02/17 10:55 AM

## 2017-09-02 NOTE — Progress Notes (Signed)
Pt had episode of chest pressure.  EKG was performed.  3 NTG tablets were given per protocol.  Pt stated relief after.

## 2017-09-02 NOTE — Progress Notes (Addendum)
ANTICOAGULATION CONSULT NOTE - f/u consult  Pharmacy Consult for heparin (PTA Xarelto on hold) Indication: chest pain/ACS  Allergies  Allergen Reactions  . Penicillins Anaphylaxis and Swelling    Has patient had a PCN reaction causing immediate rash, facial/tongue/throat swelling, SOB or lightheadedness with hypotension:Yes Has patient had a PCN reaction causing severe rash involving mucus membranes or skin necrosis:No Has patient had a PCN reaction that required hospitalization:No Has patient had a PCN reaction occurring within the last 10 years:No If all of the above answers are "NO", then may proceed with Cephalosporin use.   . Tetracyclines & Related Hives   Patient Measurements: Height: 5\' 7"  (170.2 cm) Weight: 156 lb 4.9 oz (70.9 kg) IBW/kg (Calculated) : 61.6 Heparin Dosing Weight: 70.9 kg  Vital Signs: Temp: 97.6 F (36.4 C) (12/29 2339) Temp Source: Oral (12/29 2339) BP: 123/87 (12/29 2339) Pulse Rate: 68 (12/29 2339)  Labs: Recent Labs    08/31/17 1852 09/01/17 0210 09/01/17 0521 09/01/17 0756 09/01/17 1342 09/02/17 0238  HGB 14.4 13.1  --   --   --  13.6  HCT 42.2 39.1  --   --   --  41.9  PLT 224 199  --   --   --  181  APTT 23*  --   --   --   --   --   LABPROT 13.4  --   --   --   --   --   INR 1.03  --   --   --   --   --   HEPARINUNFRC  --   --  0.85*  --  0.16* 0.68  CREATININE 0.78  --   --  0.80  --   --   TROPONINI <0.03 <0.03  --  <0.03  --   --     Estimated Creatinine Clearance: 68.2 mL/min (by C-G formula based on SCr of 0.8 mg/dL).   Medical History: Past Medical History:  Diagnosis Date  . Arthritis    osteoarthritis knees mostly   Assessment: 66 yo F with unstable angina. Transferred from [place] to [facility] for cardiology evaluation and possible cath. Pharmacy consulted to dose heparin for chest pain/ACS. Heparin started at [facility] prior to transfer.   Per cardiology notes, patient on Xarelto 10mg  daily PTA for DVT prophylaxis after  bilateral knee replacements. Initial aPTT was normal and heparin level has trended down to subtherapeutic (0.16) prior to transfer to [facility]. Will use heparin levels for dosing.   Heparin level therapeutic at 0.68. CBC stable and no bleeding reported.   Goal of Therapy:  Heparin level 0.3-0.7 units/ml Monitor platelets by anticoagulation protocol: Yes   Plan:  Continue heparin at 850 units/hr Heparin level in 6 hrs to confirm  Daily heparin level and CBC Monitor for s/s bleeding F/u cath plans   Lavonda Jumbo, PharmD Clinical Pharmacist 09/02/17 3:59 AM

## 2017-09-03 ENCOUNTER — Other Ambulatory Visit: Payer: Self-pay

## 2017-09-03 ENCOUNTER — Encounter (HOSPITAL_COMMUNITY): Payer: Self-pay | Admitting: Internal Medicine

## 2017-09-03 ENCOUNTER — Encounter (HOSPITAL_COMMUNITY)
Admission: EM | Disposition: A | Discharge status: Home / Self Care | Payer: Self-pay | Source: Home / Self Care | Attending: Interventional Cardiology

## 2017-09-03 DIAGNOSIS — I200 Unstable angina: Secondary | ICD-10-CM | POA: Diagnosis not present

## 2017-09-03 DIAGNOSIS — I10 Essential (primary) hypertension: Secondary | ICD-10-CM

## 2017-09-03 DIAGNOSIS — I25110 Atherosclerotic heart disease of native coronary artery with unstable angina pectoris: Secondary | ICD-10-CM | POA: Diagnosis not present

## 2017-09-03 HISTORY — PX: LEFT HEART CATH AND CORONARY ANGIOGRAPHY: CATH118249

## 2017-09-03 LAB — BASIC METABOLIC PANEL
Anion gap: 7 (ref 5–15)
BUN: 8 mg/dL (ref 6–20)
CHLORIDE: 105 mmol/L (ref 101–111)
CO2: 26 mmol/L (ref 22–32)
CREATININE: 0.95 mg/dL (ref 0.44–1.00)
Calcium: 9.2 mg/dL (ref 8.9–10.3)
Glucose, Bld: 113 mg/dL — ABNORMAL HIGH (ref 65–99)
POTASSIUM: 4.1 mmol/L (ref 3.5–5.1)
SODIUM: 138 mmol/L (ref 135–145)

## 2017-09-03 LAB — CBC
HCT: 42.3 % (ref 36.0–46.0)
Hemoglobin: 14.2 g/dL (ref 12.0–15.0)
MCH: 31.6 pg (ref 26.0–34.0)
MCHC: 33.6 g/dL (ref 30.0–36.0)
MCV: 94 fL (ref 78.0–100.0)
PLATELETS: 192 10*3/uL (ref 150–400)
RBC: 4.5 MIL/uL (ref 3.87–5.11)
RDW: 13.1 % (ref 11.5–15.5)
WBC: 6 10*3/uL (ref 4.0–10.5)

## 2017-09-03 LAB — PROTIME-INR
INR: 1.06
PROTHROMBIN TIME: 13.8 s (ref 11.4–15.2)

## 2017-09-03 LAB — TROPONIN I
Troponin I: <0.03 ng/mL (ref <0.03)
Troponin I: <0.03 ng/mL (ref <0.03)

## 2017-09-03 LAB — HEPARIN LEVEL (UNFRACTIONATED): HEPARIN UNFRACTIONATED: 0.64 [IU]/mL (ref 0.30–0.70)

## 2017-09-03 SURGERY — LEFT HEART CATH AND CORONARY ANGIOGRAPHY
Anesthesia: LOCAL

## 2017-09-03 MED ORDER — MIDAZOLAM HCL 2 MG/2ML IJ SOLN
INTRAMUSCULAR | Status: DC | PRN
  Administered 2017-09-03: 1 mg via INTRAVENOUS

## 2017-09-03 MED ORDER — ISOSORBIDE MONONITRATE ER 30 MG PO TB24
30.0000 mg | ORAL_TABLET | Freq: Every day | ORAL | Status: DC
  Administered 2017-09-03: 30 mg via ORAL
  Filled 2017-09-03: qty 1

## 2017-09-03 MED ORDER — HEPARIN (PORCINE) IN NACL 2-0.9 UNIT/ML-% IJ SOLN
INTRAMUSCULAR | Status: AC
  Filled 2017-09-03: qty 1000

## 2017-09-03 MED ORDER — VERAPAMIL HCL 2.5 MG/ML IV SOLN
INTRAVENOUS | Status: AC
  Filled 2017-09-03: qty 2

## 2017-09-03 MED ORDER — FENTANYL CITRATE (PF) 100 MCG/2ML IJ SOLN
INTRAMUSCULAR | Status: AC
  Filled 2017-09-03: qty 2

## 2017-09-03 MED ORDER — LIDOCAINE HCL (PF) 1 % IJ SOLN
INTRAMUSCULAR | Status: DC | PRN
  Administered 2017-09-03: 2 mL

## 2017-09-03 MED ORDER — HEPARIN (PORCINE) IN NACL 2-0.9 UNIT/ML-% IJ SOLN
INTRAMUSCULAR | Status: AC | PRN
  Administered 2017-09-03: 1000 mL

## 2017-09-03 MED ORDER — SODIUM CHLORIDE 0.9% FLUSH: 3.0000 mL | INTRAVENOUS | Status: DC | PRN

## 2017-09-03 MED ORDER — HEPARIN SODIUM (PORCINE) 1000 UNIT/ML IJ SOLN
INTRAMUSCULAR | Status: DC | PRN
  Administered 2017-09-03: 3500 [IU] via INTRAVENOUS

## 2017-09-03 MED ORDER — VERAPAMIL HCL 2.5 MG/ML IV SOLN
INTRAVENOUS | Status: DC | PRN
  Administered 2017-09-03: 10 mL via INTRA_ARTERIAL

## 2017-09-03 MED ORDER — HEPARIN SODIUM (PORCINE) 1000 UNIT/ML IJ SOLN
INTRAMUSCULAR | Status: AC
  Filled 2017-09-03: qty 1

## 2017-09-03 MED ORDER — SODIUM CHLORIDE 0.9 % IV SOLN
INTRAVENOUS | Status: DC
  Administered 2017-09-03: 14:00:00 via INTRAVENOUS

## 2017-09-03 MED ORDER — LIDOCAINE HCL (PF) 1 % IJ SOLN
INTRAMUSCULAR | Status: AC
  Filled 2017-09-03: qty 30

## 2017-09-03 MED ORDER — ENOXAPARIN SODIUM 40 MG/0.4ML ~~LOC~~ SOLN: 40.0000 mg | SUBCUTANEOUS | Status: DC

## 2017-09-03 MED ORDER — IOPAMIDOL (ISOVUE-370) INJECTION 76%
INTRAVENOUS | Status: AC
  Filled 2017-09-03: qty 100

## 2017-09-03 MED ORDER — IOPAMIDOL (ISOVUE-370) INJECTION 76%
INTRAVENOUS | Status: DC | PRN
  Administered 2017-09-03: 45 mL via INTRA_ARTERIAL

## 2017-09-03 MED ORDER — SODIUM CHLORIDE 0.9 % IV SOLN: 250.0000 mL | INTRAVENOUS | Status: DC | PRN

## 2017-09-03 MED ORDER — LOSARTAN POTASSIUM 25 MG PO TABS: 25.0000 mg | ORAL_TABLET | Freq: Every day | ORAL | 6 refills | Status: DC

## 2017-09-03 MED ORDER — ISOSORBIDE MONONITRATE ER 30 MG PO TB24: 30.0000 mg | ORAL_TABLET | Freq: Every day | ORAL | 6 refills | Status: DC

## 2017-09-03 MED ORDER — SODIUM CHLORIDE 0.9% FLUSH: 3.0000 mL | Freq: Two times a day (BID) | INTRAVENOUS | Status: DC

## 2017-09-03 MED ORDER — MIDAZOLAM HCL 2 MG/2ML IJ SOLN
INTRAMUSCULAR | Status: AC
  Filled 2017-09-03: qty 2

## 2017-09-03 MED ORDER — FENTANYL CITRATE (PF) 100 MCG/2ML IJ SOLN
INTRAMUSCULAR | Status: DC | PRN
  Administered 2017-09-03: 50 ug via INTRAVENOUS

## 2017-09-03 MED ORDER — NITROGLYCERIN 0.4 MG SL SUBL: 0.4000 mg | SUBLINGUAL_TABLET | SUBLINGUAL | 3 refills | Status: DC | PRN

## 2017-09-03 SURGICAL SUPPLY — 9 items
CATH IMPULSE 5F ANG/FL3.5 (CATHETERS) ×2 IMPLANT
DEVICE RAD COMP TR BAND LRG (VASCULAR PRODUCTS) ×2 IMPLANT
GLIDESHEATH SLEND SS 6F .021 (SHEATH) ×2 IMPLANT
GUIDEWIRE INQWIRE 1.5J.035X260 (WIRE) ×1 IMPLANT
INQWIRE 1.5J .035X260CM (WIRE) ×2
KIT HEART LEFT (KITS) ×2 IMPLANT
PACK CARDIAC CATHETERIZATION (CUSTOM PROCEDURE TRAY) ×2 IMPLANT
TRANSDUCER W/STOPCOCK (MISCELLANEOUS) ×2 IMPLANT
TUBING CIL FLEX 10 FLL-RA (TUBING) ×2 IMPLANT

## 2017-09-03 NOTE — Care Management Note (Signed)
Case Management Note Marvetta Gibbons RN, BSN Unit 4E-Case Manager [phone]  Patient Details  Name: LEXANDRA RETTKE MRN: [id number] Date of Birth: 1950/10/13  Subjective/Objective:  Pt admitted with chest pain-  Myoview study that showed ischemia-- plan for cardiac cath today 12/31                Action/Plan: PTA pt lived at home with spouse- CM to follow for transition needs.  Expected Discharge Date:  09/04/17               Expected Discharge Plan:  Home/Self Care  In-House Referral:     Discharge planning Services  CM Consult  Post Acute Care Choice:    Choice offered to:     DME Arranged:    DME Agency:     HH Arranged:    HH Agency:     Status of Service:  In process, will continue to follow  If discussed at Long Length of Stay Meetings, dates discussed:    Discharge Disposition:   Additional Comments:  Dawayne Patricia, RN 09/03/2017, 2:38 PM

## 2017-09-03 NOTE — Discharge Instructions (Signed)
**PLEASE REMEMBER TO BRING ALL OF YOUR MEDICATIONS TO EACH OF YOUR FOLLOW-UP OFFICE VISITS.  Radial Site Care Refer to this sheet in the next few weeks. These instructions provide you with information on caring for yourself after your procedure. Your caregiver may also give you more specific instructions. Your treatment has been planned according to current medical practices, but problems sometimes occur. Call your caregiver if you have any problems or questions after your procedure. HOME CARE INSTRUCTIONS  You may shower the day after the procedure.Remove the bandage (dressing) and gently wash the site with plain soap and water.Gently pat the site dry.   Do not apply powder or lotion to the site.   Do not submerge the affected site in water for 3 to 5 days.   Inspect the site at least twice daily.   Do not flex or bend the affected arm for 24 hours.   No lifting over 5 pounds (2.3 kg) for 5 days after your procedure.   Do not drive home if you are discharged the same day of the procedure. Have someone else drive you.   You may drive 24 hours after the procedure unless otherwise instructed by your caregiver.  What to expect:  Any bruising will usually fade within 1 to 2 weeks.   Blood that collects in the tissue (hematoma) may be painful to the touch. It should usually decrease in size and tenderness within 1 to 2 weeks.  SEEK IMMEDIATE MEDICAL CARE IF:  You have unusual pain at the radial site.   You have redness, warmth, swelling, or pain at the radial site.   You have drainage (other than a small amount of blood on the dressing).   You have chills.   You have a fever or persistent symptoms for more than 72 hours.   You have a fever and your symptoms suddenly get worse.   Your arm becomes pale, cool, tingly, or numb.   You have heavy bleeding from the site. Hold pressure on the site.  _____________      Dennis Bast have received care from [facility]  during this hospital stay and we look forward to continuing to provide you with excellent care in our office settings after you've left the hospital.  In order to assure a smoother transition to home following your discharge from the hospital, we will likely have you see one of our nurse practitioners or physician assistants within a few weeks of discharge.  Our advanced practice providers work closely with your physician in order to address all of your heart's needs in a timely manner.  More information about all of our providers may be found here: [email or web address]  Please plan to bring all of your prescriptions to your follow-up appointment and don't hesitate to contact us with questions or concerns.  [place] [address] - [place] [phone] [facility] [place] [facility] [place] - [phone] [place] [phone] [place] [phone]      10 Habits of Highly Healthy [place] wants to help you get well and stay well.  Live a longer, healthier life by practicing healthy habits every day.  1.  Visit your primary care provider regularly. 2.  Make time for family and friends.  Healthy relationships are important. 3.  Take medications as directed by your provider. 4.  Maintain a healthy weight and a trim waistline. 5.  Eat healthy meals and snacks, rich in fruits, vegetables, whole grains, and lean proteins. 6.  Get moving every day - aim for 150 minutes of moderate physical activity each week. 7.  Don't smoke. 8.  Avoid alcohol or drink in moderation. 9.  Manage stress through meditation or mindful relaxation. 10.  Get seven to nine hours of quality sleep each night.  Want more information on healthy  habits?  To learn more about these and other healthy habits, visit [email or web address]. _____________

## 2017-09-03 NOTE — Progress Notes (Signed)
ANTICOAGULATION CONSULT NOTE - Follow Up Consult  Pharmacy Consult for heparin (PTA Xarelto on hold) Indication: chest pain/ACS  Allergies  Allergen Reactions  . Penicillins Anaphylaxis and Swelling    Has patient had a PCN reaction causing immediate rash, facial/tongue/throat swelling, SOB or lightheadedness with hypotension:Yes Has patient had a PCN reaction causing severe rash involving mucus membranes or skin necrosis:No Has patient had a PCN reaction that required hospitalization:No Has patient had a PCN reaction occurring within the last 10 years:No If all of the above answers are "NO", then may proceed with Cephalosporin use.   . Tetracyclines & Related Hives   Patient Measurements: Height: 5\' 7"  (170.2 cm) Weight: 156 lb 4.9 oz (70.9 kg) IBW/kg (Calculated) : 61.6 Heparin Dosing Weight: 70.9 kg  Vital Signs: Temp: 97.8 F (36.6 C) (12/31 0735) Temp Source: Oral (12/31 0735) BP: 129/74 (12/31 0735) Pulse Rate: 66 (12/31 0735)  Labs: Recent Labs    08/31/17 1852 09/01/17 0210  09/01/17 0756  09/02/17 0238 09/02/17 0841 09/02/17 1811 09/03/17 0011 09/03/17 0455  HGB 14.4 13.1  --   --   --  13.6  --   --   --  14.2  HCT 42.2 39.1  --   --   --  41.9  --   --   --  42.3  PLT 224 199  --   --   --  181  --   --   --  192  APTT 23*  --   --   --   --   --   --   --   --   --   LABPROT 13.4  --   --   --   --   --   --   --   --  13.8  INR 1.03  --   --   --   --   --   --   --   --  1.06  HEPARINUNFRC  --   --    < >  --    < > 0.68 0.76*  --   --  0.64  CREATININE 0.78  --   --  0.80  --   --   --   --   --  0.95  TROPONINI <0.03 <0.03  --  <0.03  --   --   --  <0.03 <0.03 <0.03   < > = values in this interval not displayed.    Estimated Creatinine Clearance: 57.4 mL/min (by C-G formula based on SCr of 0.95 mg/dL).  Medications: Heparin @ 750 units/hr  Assessment: 66 yo F started on heparin for unstable angina. Transferred from [facility] to [facility] for  cardiology evaluation.   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 Initial aPTT was normal and heparin level has trended down to subtherapeutic (0.16) prior to transfer to [facility]. Will use heparin levels for dosing.   Heparin level therapeutic this AM. CBC Stable. No bleeding. Stress test with possible ischemia. Noted plan for cath today.  Goal of Therapy:  Heparin level 0.3-0.7 units/ml Monitor platelets by anticoagulation protocol: Yes   Plan:  Continue heparin at 750 units/hr Daily heparin level and CBC Monitor for s/sx bleeding   Elicia Lamp, PharmD, BCPS Clinical Pharmacist Clinical phone for 09/03/2017 until 3:30pm: [phone] If after 3:30pm, please call main pharmacy at: [phone] 09/03/2017 8:15 AM

## 2017-09-03 NOTE — Progress Notes (Signed)
Progress Note  Patient Name: [name] Date of Encounter: 09/03/2017  Primary Cardiologist: New, Brookelynn Hamor   Subjective   66 year old female with a history of arthritis who was admitted to [facility] long hospital with anginal-like symptoms.  She had a stress Myoview study yesterday which revealed some defects and she was transferred to [facility] in anticipation for having a heart catheterization on Monday.  Pt currently pain-free on heparin and IV Nitro, had CP yesterday>>did not get ongoing relief till nitro started. Has a headache from it.    Inpatient Medications    Scheduled Meds: . acetaminophen  1,000 mg Oral Q8H  . aspirin EC  81 mg Oral Daily  . docusate sodium  100 mg Oral BID  . losartan  25 mg Oral Daily  . pantoprazole  40 mg Oral Daily  . simvastatin  40 mg Oral QPM  . sodium chloride flush  3 mL Intravenous Q12H  . sodium chloride flush  3 mL Intravenous Q12H   Continuous Infusions: . sodium chloride Stopped (09/01/17 0348)  . sodium chloride    . sodium chloride    . sodium chloride 1 mL/kg/hr (09/03/17 0623)  . heparin 750 Units/hr (09/03/17 0507)  . nitroGLYCERIN 10 mcg/min (09/03/17 0500)   PRN Meds: sodium chloride, sodium chloride, acetaminophen, aspirin-acetaminophen-caffeine, nitroGLYCERIN, ondansetron (ZOFRAN) IV, sodium chloride flush, sodium chloride flush   Vital Signs    Vitals:   09/03/17 0400 09/03/17 0500 09/03/17 0600 09/03/17 0735  BP: 129/81 119/84 129/89 129/74  Pulse: 71 63 63 66  Resp: 19 18 15 17   Temp: 97.9 F (36.6 C)   97.8 F (36.6 C)  TempSrc: Oral   Oral  SpO2: 98% 99% 98% 99%  Weight:      Height:        Intake/Output Summary (Last 24 hours) at 09/03/2017 0810 Last data filed at 09/03/2017 0500 Gross per 24 hour  Intake 669.5 ml  Output -  Net 669.5 ml   Filed Weights   08/31/17 2158 09/01/17 1600 09/01/17 1627  Weight: 70.3 kg (155 lb) 70.9 kg (156 lb 4.9 oz) 70.9 kg (156 lb 4.9 oz)    Telemetry      SR, no sig ectopy, rare bradycardia in the 50s, not sustained  - Personally Reviewed  ECG    12/31 SR, no acute changes  - Personally Reviewed  Physical Exam   GEN: NAD  Neck: No JVD seen Cardiac: Reg R&R, 1-2/6 SEM Respiratory: CTA bilaterally GI: +BS, NT, ND, soft MS: no edema or deformity, distal pulses intact Neuro:  No deficits seen Psych: Nl affect  Labs    Chemistry Recent Labs  Lab 08/31/17 1852 09/01/17 0210 09/01/17 0756 09/03/17 0455  NA 137  --  140 138  K 2.7*  --  3.8 4.1  CL 107  --  111 105  CO2 17*  --  23 26  GLUCOSE 121*  --  101* 113*  BUN 18  --  12 8  CREATININE 0.78  --  0.80 0.95  CALCIUM 8.5*  --  8.2* 9.2  PROT 6.5 5.8*  --   --   ALBUMIN 4.0 3.5  --   --   AST 38 25  --   --   ALT 35 31  --   --   ALKPHOS 77 71  --   --   BILITOT 0.5 0.6  --   --   GFRNONAA >60  --  >60 >60  GFRAA >  60  --  >60 >60  ANIONGAP 13  --  6 7     Hematology Recent Labs  Lab 09/01/17 0210 09/02/17 0238 09/03/17 0455  WBC 6.7 5.5 6.0  RBC 4.22 4.41 4.50  HGB 13.1 13.6 14.2  HCT 39.1 41.9 42.3  MCV 92.7 95.0 94.0  MCH 31.0 30.8 31.6  MCHC 33.5 32.5 33.6  RDW 12.9 13.2 13.1  PLT 199 181 192    Cardiac Enzymes Recent Labs  Lab 09/01/17 0756 09/02/17 1811 09/03/17 0011 09/03/17 0455  TROPONINI <0.03 <0.03 <0.03 <0.03    Recent Labs  Lab 08/31/17 1901  TROPIPOC 0.00     Radiology    Nm Myocar Multi W/spect W/wall Motion / Ef  Result Date: 09/01/2017 CLINICAL DATA:  Chest pain. EXAM: MYOCARDIAL IMAGING WITH SPECT (REST AND PHARMACOLOGIC-STRESS) GATED LEFT VENTRICULAR WALL MOTION STUDY LEFT VENTRICULAR EJECTION FRACTION TECHNIQUE: Standard myocardial SPECT imaging was performed after resting intravenous injection of 10 mCi Tc-35m tetrofosmin. Subsequently, intravenous infusion of Lexiscan was performed under the supervision of the Cardiology staff. At peak effect of the drug, 30 mCi Tc-46m tetrofosmin was injected intravenously and  standard myocardial SPECT imaging was performed. Quantitative gated imaging was also performed to evaluate left ventricular wall motion, and estimate left ventricular ejection fraction. COMPARISON:  Chest radiograph 08/31/2017.  Chest CT 08/21/2017. FINDINGS: Perfusion: No rest defects. With stress there is an area of mild reversibility which is medium in size and involves the mid to basilar segment of the anteroseptal wall. Wall Motion: Normal left ventricular wall motion. No left ventricular dilation. Left Ventricular Ejection Fraction: 66 % End diastolic volume 161 ml End systolic volume 35 ml IMPRESSION: 1. Medium size area of reversibility involving the mid to basilar segment of the anteroseptal wall. Suspicious for inducible ischemia. 2. Normal left ventricular wall motion. 3. Left ventricular ejection fraction 66% 4. Non invasive risk stratification*: Intermediate *2012 Appropriate Use Criteria for Coronary Revascularization Focused Update: J Am Coll Cardiol. 0960;45(4):098-119. http://content.airportbarriers.com.aspx?articleid=1201161 These results will be called to the ordering clinician or representative by the Radiologist Assistant, and communication documented in the PACS or zVision Dashboard. Electronically Signed   By: Abigail Miyamoto M.D.   On: 09/01/2017 13:36    Cardiac Studies   Myoview   IMPRESSION: 1. Medium size area of reversibility involving the mid to basilar segment of the anteroseptal wall. Suspicious for inducible ischemia.  2. Normal left ventricular wall motion.  3. Left ventricular ejection fraction 66%  Patient Profile     66 year old female with a history of arthritis, FH CAD, who was admitted to [facility] long hospital with anginal-like symptoms.  She had a stress Myoview study 12/30 which revealed some defects and she was transferred to [facility] in anticipation for having a heart catheterization on Monday.  Assessment & Plan     1.  Possible coronary artery  disease: - HA but no other problems on hep, nitro, ASA, ARB, statin - MV results above, abnl, hope this is a false+ - FH CAD, otherwise no CRF except age, pt lives a healthy life.  - no questions or concerns about cath  2. Hyperlipidemia - pt admits last profile w/ total chol 240, HDL stays up, she exercises a lot. - eats healthy, on Zocor 40 mg>>LDL still 105 - change to high-dose statin>>Lipitor 80 mg if cath +CAD    For questions or updates, please contact [place] Please consult [email or web address] for contact info under Cardiology/STEMI.      Signed, Suanne Marker  Barrett, PA-C  09/03/2017, 8:10 AM    Attending Note:   The patient was seen and examined.  Agree with assessment and plan as noted above.  Changes made to the above note as needed.  Patient seen and independently examined with Rosaria Ferries, PA .   We discussed all aspects of the encounter. I agree with the assessment and plan as stated above.  1.  Chest discomfort: Patient has had some recurrent episodes of chest pain-seems to be relieved with nitroglycerin. He had a Myoview study that showed ischemia.  She is scheduled for heart catheterization later today.   I have spent a total of 40 minutes with patient reviewing hospital  notes , telemetry, EKGs, labs and examining patient as well as establishing an assessment and plan that was discussed with the patient. > 50% of time was spent in direct patient care.    Thayer Headings, Brooke Bonito., MD, [facility] 09/03/2017, 9:16 AM [address]  [place] Pager [phone]

## 2017-09-03 NOTE — Interval H&P Note (Signed)
History and Physical Interval Note:  09/03/2017 12:38 PM  [name]  has presented today for cardiac catheterization, with the diagnosis of unstable angina  The various methods of treatment have been discussed with the patient and family. After consideration of risks, benefits and other options for treatment, the patient has consented to  Procedure(s): LEFT HEART CATH AND CORONARY ANGIOGRAPHY (N/A) as a surgical intervention .  The patient's history has been reviewed, patient examined, no change in status, stable for surgery.  I have reviewed the patient's chart and labs.  Questions were answered to the patient's satisfaction.    Cath Lab Visit (complete for each Cath Lab visit)  Clinical Evaluation Leading to the Procedure:   ACS: Yes.    Non-ACS:  N/A  [name]

## 2017-09-03 NOTE — H&P (View-Only) (Signed)
Progress Note  Patient Name: [name] Date of Encounter: 09/03/2017  Primary Cardiologist: New, Nahser   Subjective   66 year old female with a history of arthritis who was admitted to [facility] long hospital with anginal-like symptoms.  She had a stress Myoview study yesterday which revealed some defects and she was transferred to [facility] in anticipation for having a heart catheterization on Monday.  Pt currently pain-free on heparin and IV Nitro, had CP yesterday>>did not get ongoing relief till nitro started. Has a headache from it.    Inpatient Medications    Scheduled Meds: . acetaminophen  1,000 mg Oral Q8H  . aspirin EC  81 mg Oral Daily  . docusate sodium  100 mg Oral BID  . losartan  25 mg Oral Daily  . pantoprazole  40 mg Oral Daily  . simvastatin  40 mg Oral QPM  . sodium chloride flush  3 mL Intravenous Q12H  . sodium chloride flush  3 mL Intravenous Q12H   Continuous Infusions: . sodium chloride Stopped (09/01/17 0348)  . sodium chloride    . sodium chloride    . sodium chloride 1 mL/kg/hr (09/03/17 0623)  . heparin 750 Units/hr (09/03/17 0507)  . nitroGLYCERIN 10 mcg/min (09/03/17 0500)   PRN Meds: sodium chloride, sodium chloride, acetaminophen, aspirin-acetaminophen-caffeine, nitroGLYCERIN, ondansetron (ZOFRAN) IV, sodium chloride flush, sodium chloride flush   Vital Signs    Vitals:   09/03/17 0400 09/03/17 0500 09/03/17 0600 09/03/17 0735  BP: 129/81 119/84 129/89 129/74  Pulse: 71 63 63 66  Resp: 19 18 15 17   Temp: 97.9 F (36.6 C)   97.8 F (36.6 C)  TempSrc: Oral   Oral  SpO2: 98% 99% 98% 99%  Weight:      Height:        Intake/Output Summary (Last 24 hours) at 09/03/2017 0810 Last data filed at 09/03/2017 0500 Gross per 24 hour  Intake 669.5 ml  Output -  Net 669.5 ml   Filed Weights   08/31/17 2158 09/01/17 1600 09/01/17 1627  Weight: 70.3 kg (155 lb) 70.9 kg (156 lb 4.9 oz) 70.9 kg (156 lb 4.9 oz)    Telemetry      SR, no sig ectopy, rare bradycardia in the 50s, not sustained  - Personally Reviewed  ECG    12/31 SR, no acute changes  - Personally Reviewed  Physical Exam   GEN: NAD  Neck: No JVD seen Cardiac: Reg R&R, 1-2/6 SEM Respiratory: CTA bilaterally GI: +BS, NT, ND, soft MS: no edema or deformity, distal pulses intact Neuro:  No deficits seen Psych: Nl affect  Labs    Chemistry Recent Labs  Lab 08/31/17 1852 09/01/17 0210 09/01/17 0756 09/03/17 0455  NA 137  --  140 138  K 2.7*  --  3.8 4.1  CL 107  --  111 105  CO2 17*  --  23 26  GLUCOSE 121*  --  101* 113*  BUN 18  --  12 8  CREATININE 0.78  --  0.80 0.95  CALCIUM 8.5*  --  8.2* 9.2  PROT 6.5 5.8*  --   --   ALBUMIN 4.0 3.5  --   --   AST 38 25  --   --   ALT 35 31  --   --   ALKPHOS 77 71  --   --   BILITOT 0.5 0.6  --   --   GFRNONAA >60  --  >60 >60  GFRAA >  60  --  >60 >60  ANIONGAP 13  --  6 7     Hematology Recent Labs  Lab 09/01/17 0210 09/02/17 0238 09/03/17 0455  WBC 6.7 5.5 6.0  RBC 4.22 4.41 4.50  HGB 13.1 13.6 14.2  HCT 39.1 41.9 42.3  MCV 92.7 95.0 94.0  MCH 31.0 30.8 31.6  MCHC 33.5 32.5 33.6  RDW 12.9 13.2 13.1  PLT 199 181 192    Cardiac Enzymes Recent Labs  Lab 09/01/17 0756 09/02/17 1811 09/03/17 0011 09/03/17 0455  TROPONINI <0.03 <0.03 <0.03 <0.03    Recent Labs  Lab 08/31/17 1901  TROPIPOC 0.00     Radiology    Nm Myocar Multi W/spect W/wall Motion / Ef  Result Date: 09/01/2017 CLINICAL DATA:  Chest pain. EXAM: MYOCARDIAL IMAGING WITH SPECT (REST AND PHARMACOLOGIC-STRESS) GATED LEFT VENTRICULAR WALL MOTION STUDY LEFT VENTRICULAR EJECTION FRACTION TECHNIQUE: Standard myocardial SPECT imaging was performed after resting intravenous injection of 10 mCi Tc-67m tetrofosmin. Subsequently, intravenous infusion of Lexiscan was performed under the supervision of the Cardiology staff. At peak effect of the drug, 30 mCi Tc-77m tetrofosmin was injected intravenously and  standard myocardial SPECT imaging was performed. Quantitative gated imaging was also performed to evaluate left ventricular wall motion, and estimate left ventricular ejection fraction. COMPARISON:  Chest radiograph 08/31/2017.  Chest CT 08/21/2017. FINDINGS: Perfusion: No rest defects. With stress there is an area of mild reversibility which is medium in size and involves the mid to basilar segment of the anteroseptal wall. Wall Motion: Normal left ventricular wall motion. No left ventricular dilation. Left Ventricular Ejection Fraction: 66 % End diastolic volume 502 ml End systolic volume 35 ml IMPRESSION: 1. Medium size area of reversibility involving the mid to basilar segment of the anteroseptal wall. Suspicious for inducible ischemia. 2. Normal left ventricular wall motion. 3. Left ventricular ejection fraction 66% 4. Non invasive risk stratification*: Intermediate *2012 Appropriate Use Criteria for Coronary Revascularization Focused Update: J Am Coll Cardiol. 7741;28(7):867-672. http://content.airportbarriers.com.aspx?articleid=[email or web address] These results will be called to the ordering clinician or representative by the Radiologist Assistant, and communication documented in the PACS or zVision Dashboard. Electronically Signed   By: Abigail Miyamoto M.D.   On: 09/01/2017 13:36    Cardiac Studies   Myoview   IMPRESSION: 1. Medium size area of reversibility involving the mid to basilar segment of the anteroseptal wall. Suspicious for inducible ischemia.  2. Normal left ventricular wall motion.  3. Left ventricular ejection fraction 66%  Patient Profile     66 year old female with a history of arthritis, FH CAD, who was admitted to [facility] long hospital with anginal-like symptoms.  She had a stress Myoview study 12/30 which revealed some defects and she was transferred to [facility] in anticipation for having a heart catheterization on Monday.  Assessment & Plan     1.  Possible coronary artery  disease: - HA but no other problems on hep, nitro, ASA, ARB, statin - MV results above, abnl, hope this is a false+ - FH CAD, otherwise no CRF except age, pt lives a healthy life.  - no questions or concerns about cath  2. Hyperlipidemia - pt admits last profile w/ total chol 240, HDL stays up, she exercises a lot. - eats healthy, on Zocor 40 mg>>LDL still 105 - change to high-dose statin>>Lipitor 80 mg if cath +CAD    For questions or updates, please contact [place] Please consult [email or web address] for contact info under Cardiology/STEMI.      Signed, Suanne Marker  Barrett, PA-C  09/03/2017, 8:10 AM    Attending Note:   The patient was seen and examined.  Agree with assessment and plan as noted above.  Changes made to the above note as needed.  Patient seen and independently examined with Rosaria Ferries, PA .   We discussed all aspects of the encounter. I agree with the assessment and plan as stated above.  1.  Chest discomfort: Patient has had some recurrent episodes of chest pain-seems to be relieved with nitroglycerin. He had a Myoview study that showed ischemia.  She is scheduled for heart catheterization later today.   I have spent a total of 40 minutes with patient reviewing hospital  notes , telemetry, EKGs, labs and examining patient as well as establishing an assessment and plan that was discussed with the patient. > 50% of time was spent in direct patient care.    Thayer Headings, Brooke Bonito., MD, [facility] 09/03/2017, 9:16 AM [address],  [place] Pager [phone]

## 2017-09-03 NOTE — Discharge Summary (Signed)
Discharge Summary    Patient ID: [name],  MRN: [id number], DOB/AGE: 65-Mar-1952 65 y.o.  Admit date: 08/31/2017 Discharge date: 09/03/2017  Primary Care Provider: Jani Guerra Primary Cardiologist: Dr. Wyvonne Guerra  Discharge Diagnoses    Principal Problem:   Unstable angina [facility]) Active Problems:   Essential hypertension  Allergies Allergies  Allergen Reactions  . Penicillins Anaphylaxis and Swelling    Has patient had a PCN reaction causing immediate rash, facial/tongue/throat swelling, SOB or lightheadedness with hypotension:Yes Has patient had a PCN reaction causing severe rash involving mucus membranes or skin necrosis:No Has patient had a PCN reaction that required hospitalization:No Has patient had a PCN reaction occurring within the last 10 years:No If all of the above answers are "NO", then may proceed with Cephalosporin use.   . Tetracyclines & Related Hives    Diagnostic Studies/Procedures    Cardiac Cath 09/03/17:  Conclusions: 1. 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 No significant disease to explain patient's chest pain. 2. Normal to hyperdynamic left ventricular contraction with normal filling pressure.  Recommendations: 1. Medical therapy for possible vasospasm and/or microvascular dysfunction. Will discontinue nitroglycerin infusion and initiate isosorbide mononitrate. 2. If chest pain continues, consider work-up for non-cardiac causes. 3. If chest pain free and weaned off nitroglycerin infusion, consider discharge later this afternoon or tomorrow. _____________   History of Present Illness     [name] is a 66yo F with hx of arthritis who is admitted to the hospital with several weeks of progressive anginal-like symptoms on 08/31/17. She reports that the her chest pain symptoms were intermittent, lasting for several minutes in duration which radiated through her back and in between her shoulder blades. She  had some diaphoresis and generalized dizziness associated with her discomfort. There was no radiation to her arms or jaw. The pain did not worsen with deep breathing or exercise. On 08/31/17 the patient had recurrent episodes of chest discomfort while sitting at the movies. She proceeded to dinner and the pain was still present. She felt the need to proceed to [facility] ED for further evaluation.   Hospital Course     Consultants: None  In the ED, she received SL NTG which subsided her pain. Her troponin levels have been negative. EKG showed very mild non-specific ST segment abnormalities. She was scheduled for a Myoview stress test on 09/01/17.   Her stress test came back as intermediate risk and abnormal, revealing an area of mild reversibility involving the mid to basilar segment of the anterolateral wall suspicious for inducible ischemia. EF was 66%.   Heparin and NTG gtts were started and the pt was transferred to [facility] in anticipation of a cardiac cath. A cardiac catheterization was subsequently scheduled and completed on 16/01/09 without complications. Her cath showed mild, non-obstructive CAD involving the LAD and CIRC with no significant disease to explain the patients chest pain. Recommendations were made to treat medically for possible vasospasm and/or micro vascular dysfunction.  Imdur was added to her current medication regimen.   The patient was seen and examined by Dr. Acie Guerra who feels that she is stable and ready for discharge.  _____________  Discharge Vitals Blood pressure (!) 108/58, pulse 70, temperature 97.8 F (36.6 C), temperature source Oral, resp. rate 16, height 5\' 7"  (1.702 m), weight 156 lb 4.9 oz (70.9 kg), SpO2 98 %.  Filed Weights   08/31/17 2158 09/01/17 1600 09/01/17 1627  Weight: 155 lb (70.3 kg) 156 lb 4.9 oz (70.9  kg) 156 lb 4.9 oz (70.9 kg)    Labs & Radiologic Studies    CBC Recent Labs    08/31/17 1852 09/01/17 0210 09/02/17 0238 09/03/17 0455  WBC 9.1  6.7 5.5 6.0  NEUTROABS 5.6 4.4  --   --   HGB 14.4 13.1 13.6 14.2  HCT 42.2 39.1 41.9 42.3  MCV 91.7 92.7 95.0 94.0  PLT 224 199 181 299   Basic Metabolic Panel Recent Labs    09/01/17 0756 09/03/17 0455  NA 140 138  K 3.8 4.1  CL 111 105  CO2 23 26  GLUCOSE 101* 113*  BUN 12 8  CREATININE 0.80 0.95  CALCIUM 8.2* 9.2   Liver Function Tests Recent Labs    08/31/17 1852 09/01/17 0210  AST 38 25  ALT 35 31  ALKPHOS 77 71  BILITOT 0.5 0.6  PROT 6.5 5.8*  ALBUMIN 4.0 3.5   Recent Labs    08/31/17 1852  LIPASE 36   Cardiac Enzymes Recent Labs    09/02/17 1811 09/03/17 0011 09/03/17 0455  TROPONINI <0.03 <0.03 <0.03    Hemoglobin A1C Recent Labs    09/01/17 0211  HGBA1C 5.6   Fasting Lipid Panel Recent Labs    08/31/17 1852  CHOL 189  HDL 72  LDLCALC 105*  TRIG 60  CHOLHDL 2.6   Thyroid Function Tests Recent Labs    09/01/17 0211  TSH 7.632*   _____________  Ct Angio Chest Pe W Or Wo Contrast  Result Date: 08/21/2017 CLINICAL DATA:  Chest pain EXAM: CT ANGIOGRAPHY CHEST WITH CONTRAST TECHNIQUE: Multidetector CT imaging of the chest was performed using the standard protocol during bolus administration of intravenous contrast. Multiplanar CT image reconstructions and MIPs were obtained to evaluate the vascular anatomy. CONTRAST:  10mL ISOVUE-370 IOPAMIDOL (ISOVUE-370) INJECTION 76% COMPARISON:  None. FINDINGS: Cardiovascular: No filling defects in the pulmonary arteries to suggestpulmonary emboli. Heart is normal size. Aorta is normal caliber. Mediastinum/Nodes: No mediastinal, hilar, or axillary adenopathy. Trachea and esophagus are unremarkable. Thyroid unremarkable. Lungs/Pleura: Lungs are clear. No focal airspace opacities or suspicious nodules. No effusions. Upper Abdomen: Imaging into the upper abdomen shows no acute findings. Musculoskeletal: Chest wall soft tissues are unremarkable. No acute bony abnormality. Review of the MIP images confirms  the above findings. IMPRESSION: No evidence of pulmonary embolus. No acute cardiopulmonary disease. Electronically Signed   By: [name] M.D.   On: 08/21/2017 13:07   Nm Myocar Multi W/spect W/wall Motion / Ef  Result Date: 09/01/2017 CLINICAL DATA:  Chest pain. EXAM: MYOCARDIAL IMAGING WITH SPECT (REST AND PHARMACOLOGIC-STRESS) GATED LEFT VENTRICULAR WALL MOTION STUDY LEFT VENTRICULAR EJECTION FRACTION TECHNIQUE: Standard myocardial SPECT imaging was performed after resting intravenous injection of 10 mCi Tc-81m tetrofosmin. Subsequently, intravenous infusion of Lexiscan was performed under the supervision of the Cardiology staff. At peak effect of the drug, 30 mCi Tc-27m tetrofosmin was injected intravenously and standard myocardial SPECT imaging was performed. Quantitative gated imaging was also performed to evaluate left ventricular wall motion, and estimate left ventricular ejection fraction. COMPARISON:  Chest radiograph 08/31/2017.  Chest CT 08/21/2017. FINDINGS: Perfusion: No rest defects. With stress there is an area of mild reversibility which is medium in size and involves the mid to basilar segment of the anteroseptal wall. Wall Motion: Normal left ventricular wall motion. No left ventricular dilation. Left Ventricular Ejection Fraction: 66 % End diastolic volume 371 ml End systolic volume 35 ml IMPRESSION: 1. Medium size area of reversibility involving the mid to  basilar segment of the anteroseptal wall. Suspicious for inducible ischemia. 2. Normal left ventricular wall motion. 3. Left ventricular ejection fraction 66% 4. Non invasive risk stratification*: Intermediate *2012 Appropriate Use Criteria for Coronary Revascularization Focused Update: J Am Coll Cardiol. 1749;44(9):675-916. http://content.[email or web address].aspx?articleid=[email or web address] These results will be called to the ordering clinician or representative by the Radiologist Assistant, and communication documented in the PACS or  zVision Dashboard. Electronically Signed   By: Abigail Miyamoto M.D.   On: 09/01/2017 13:36   Dg Chest Port 1 View  Result Date: 08/31/2017 CLINICAL DATA:  66 year old female with chest pain. EXAM: PORTABLE CHEST 1 VIEW COMPARISON:  Chest CT dated 08/21/2017 FINDINGS: The heart size and mediastinal contours are within normal limits. Both lungs are clear. The visualized skeletal structures are unremarkable. IMPRESSION: No active disease. Electronically Signed   By: Anner Crete M.D.   On: 08/31/2017 19:27   Disposition   Pt is being discharged home today in good condition.  Follow-up Plans & Appointments    Follow-up Information    [place], Crista Luria, [place] Follow up on 09/20/2017.   Specialty:  Cardiology Why:  8:00 AM Dr. Elmarie Shiley PA Contact information: [place] [address] [phone]           Discharge Medications   Allergies as of 09/03/2017      Reactions   Penicillins Anaphylaxis, Swelling   Has patient had a PCN reaction causing immediate rash, facial/tongue/throat swelling, SOB or lightheadedness with hypotension:Yes Has patient had a PCN reaction causing severe rash involving mucus membranes or skin necrosis:No Has patient had a PCN reaction that required hospitalization:No Has patient had a PCN reaction occurring within the last 10 years:No If all of the above answers are "NO", then may proceed with Cephalosporin use.   Tetracyclines & Related Hives      Medication List    STOP taking these medications   acetaminophen 500 MG tablet Commonly known as:  TYLENOL   lisinopril 5 MG tablet Commonly known as:  PRINIVIL,ZESTRIL   rivaroxaban 10 MG Tabs tablet Commonly known as:  XARELTO     TAKE these medications   aspirin EC 81 MG tablet Take 81 mg by mouth at bedtime.   aspirin-acetaminophen-caffeine 250-250-65 MG tablet Commonly known as:  EXCEDRIN MIGRAINE Take 2 tablets by mouth every 6 (six) hours as needed for headache.    Cholecalciferol 1000 units tablet Take 1,000 Units by mouth 2 (two) times daily.   isosorbide mononitrate 30 MG 24 hr tablet Commonly known as:  IMDUR Take 1 tablet (30 mg total) by mouth daily. Start taking on:  09/04/2017   losartan 25 MG tablet Commonly known as:  COZAAR Take 1 tablet (25 mg total) by mouth daily. Start taking on:  09/04/2017   nitroGLYCERIN 0.4 MG SL tablet Commonly known as:  NITROSTAT Place 1 tablet (0.4 mg total) under the tongue every 5 (five) minutes x 3 doses as needed for chest pain.   omeprazole 20 MG capsule Commonly known as:  PRILOSEC Take 20 mg by mouth daily.   polyethylene glycol packet Commonly known as:  MIRALAX / GLYCOLAX Take 17 g by mouth 2 (two) times daily.   simvastatin 40 MG tablet Commonly known as:  ZOCOR Take 40 mg by mouth every evening.       Outstanding Labs/Studies   None  Duration of Discharge Encounter   Greater than 30 minutes including physician time.  SignedMurray Hodgkins NP 09/03/2017, 6:05 PM  Attending Note:  The patient was seen and examined.  Agree with assessment and plan as noted above.  Changes made to the above note as needed.  Patient seen and independently examined with Ignacia Bayley, NP .   We discussed all aspects of the encounter. I agree with the assessment and plan as stated above.  1.   Chest pain :    myoview showed some defects Cath showed no epicardial disease.   She had some evidence of slow flow - thought possibly to be due to small vessel disease. Her CP is somewhat atypical Will try her on Imdur.   We discussed possible side effects of Imudr. Will have her follow up with [place] in several weeks    I have spent a total of 40 minutes with patient reviewing hospital  notes , telemetry, EKGs, labs and examining patient as well as establishing an assessment and plan that was discussed with the patient. > 50% of time was spent in direct patient care.    Thayer Headings, Brooke Bonito., MD,  [facility] 09/04/2017, 9:22 AM [address]  [place] Pager [phone]

## 2017-09-06 DIAGNOSIS — R079 Chest pain, unspecified: Secondary | ICD-10-CM | POA: Diagnosis not present

## 2017-09-06 DIAGNOSIS — E7800 Pure hypercholesterolemia, unspecified: Secondary | ICD-10-CM | POA: Diagnosis not present

## 2017-09-10 DIAGNOSIS — Z880 Allergy status to penicillin: Secondary | ICD-10-CM | POA: Diagnosis not present

## 2017-09-10 DIAGNOSIS — M1990 Unspecified osteoarthritis, unspecified site: Secondary | ICD-10-CM | POA: Diagnosis not present

## 2017-09-10 DIAGNOSIS — G5782 Other specified mononeuropathies of left lower limb: Secondary | ICD-10-CM | POA: Diagnosis not present

## 2017-09-10 DIAGNOSIS — Z9889 Other specified postprocedural states: Secondary | ICD-10-CM | POA: Diagnosis not present

## 2017-09-10 DIAGNOSIS — Z881 Allergy status to other antibiotic agents status: Secondary | ICD-10-CM | POA: Diagnosis not present

## 2017-09-10 DIAGNOSIS — G5681 Other specified mononeuropathies of right upper limb: Secondary | ICD-10-CM | POA: Diagnosis not present

## 2017-09-10 DIAGNOSIS — M25512 Pain in left shoulder: Secondary | ICD-10-CM | POA: Diagnosis not present

## 2017-09-10 DIAGNOSIS — Z96653 Presence of artificial knee joint, bilateral: Secondary | ICD-10-CM | POA: Diagnosis not present

## 2017-09-10 DIAGNOSIS — G8929 Other chronic pain: Secondary | ICD-10-CM | POA: Diagnosis not present

## 2017-09-18 DIAGNOSIS — K219 Gastro-esophageal reflux disease without esophagitis: Secondary | ICD-10-CM | POA: Diagnosis not present

## 2017-09-18 DIAGNOSIS — Z1211 Encounter for screening for malignant neoplasm of colon: Secondary | ICD-10-CM | POA: Diagnosis not present

## 2017-09-18 DIAGNOSIS — R079 Chest pain, unspecified: Secondary | ICD-10-CM | POA: Diagnosis not present

## 2017-09-18 DIAGNOSIS — R1310 Dysphagia, unspecified: Secondary | ICD-10-CM | POA: Diagnosis not present

## 2017-09-19 NOTE — Progress Notes (Signed)
Cardiology Office Note    Date:  09/20/2017   ID:  Lusero, Nordlund 04-Apr-1951, MRN [id number]  PCP:  [name], MD  Cardiologist:  Dr. Acie Fredrickson  Chief Complaint: Hospital  follow up  History of Present Illness:   AHSLEY Guerra is a 67 y.o. female with hx of arthritis who presents for hospital follow up.   Admitted to the hospital with several weeks of progressive anginal-like symptoms on 08/31/17. She reports that the her chest pain symptoms were intermittent, lasting for several minutes in duration which radiated through her back and in between her shoulder blades.  In the ED, she received SL NTG which subsided her pain. Her stress test came back as intermediate risk and abnormal, revealing an area of mild reversibility involving the mid to basilar segment of the anterolateral wall suspicious for inducible ischemia. EF was 66%. Heparin and NTG gtts were started and the pt was transferred to [facility] in anticipation of a cardiac cath. A cardiac catheterization was subsequently showed mild, non-obstructive CAD involving the LAD and CIRC with no significant disease to explain the patients chest pain. Recommendations were made to treat medically for possible vasospasm and/or micro vascular dysfunction.  Imdur was added to her current medication regimen.    Patient is here for follow-up. she has discontinued her Imdur by herself due to headache.  Now resolved after discontinuation.  Patient continues to have intermittent epigastric pain.  Symptoms improving after increasing her Prilosec to 20 mg twice daily.  Patient has seen by GI and plan for EGD in upcoming weeks.  No recurrent exacerbating symptoms concerning for angina.  Patient denies shortness of breath, lower extremity edema, nausea, vomiting, orthopnea, PND or syncope.  Past Medical History:  Diagnosis Date  . Arthritis    osteoarthritis knees mostly  . Chest pain    a. 08/2017 MV: mid-basilar anteroseptal ischemia;  b. 08/2017 Cath:  LM nl, LAD 76m, D2 30, RI nl, LCX 20ost, RCA large, nl, EF 65%.  . Diastolic dysfunction    a. 08/2017 Echo: EF 60-65%, no rwma, Gr1 DD.    Past Surgical History:  Procedure Laterality Date  . DIAGNOSTIC LAPAROSCOPY     ovarian cystectomy/ BSO done.  Marland Kitchen HARDWARE REMOVAL Left 07/03/2016   Procedure: LEFT KNEE HARDWARE REMOVAL;  Surgeon: Paralee Cancel, MD;  Location: [facility];  Service: Orthopedics;  Laterality: Left;  . KNEE SURGERY Bilateral    ACL x2, meniscectomy x3, open patella(bone from other knee)  . LEFT HEART CATH AND CORONARY ANGIOGRAPHY N/A 09/03/2017   Procedure: LEFT HEART CATH AND CORONARY ANGIOGRAPHY;  Surgeon: Nelva Bush, MD;  Location: [facility];  Service: Cardiovascular;  Laterality: N/A;  . ROTATOR CUFF REPAIR Right   . TOTAL KNEE ARTHROPLASTY Bilateral 07/03/2016   Procedure: TOTAL KNEE BILATERAL ARTHROPLASTY;  Surgeon: Paralee Cancel, MD;  Location: [facility];  Service: Orthopedics;  Laterality: Bilateral;    Current Medications: Prior to Admission medications   Medication Sig Start Date End Date Taking? Authorizing Provider  aspirin EC 81 MG tablet Take 81 mg by mouth at bedtime.    [provider]  aspirin-acetaminophen-caffeine (EXCEDRIN MIGRAINE) 303 [id number] MG tablet Take 2 tablets by mouth every 6 (six) hours as needed for headache.    [provider]  Cholecalciferol 1000 units tablet Take 1,000 Units by mouth 2 (two) times daily.    [provider]  isosorbide mononitrate (IMDUR) 30 MG 24 hr tablet Take 1 tablet (30 mg total) by  mouth daily. 09/04/17   Theora Gianotti, NP  losartan (COZAAR) 25 MG tablet Take 1 tablet (25 mg total) by mouth daily. 09/04/17   Theora Gianotti, NP  nitroGLYCERIN (NITROSTAT) 0.4 MG SL tablet Place 1 tablet (0.4 mg total) under the tongue every 5 (five) minutes x 3 doses as needed for chest pain. 09/03/17   Theora Gianotti, NP  omeprazole (PRILOSEC) 20 MG capsule Take 20 mg  by mouth daily. 08/20/17   [provider]  polyethylene glycol (MIRALAX / GLYCOLAX) packet Take 17 g by mouth 2 (two) times daily. 07/07/16   Danae Orleans, PA-C  simvastatin (ZOCOR) 40 MG tablet Take 40 mg by mouth every evening. 08/20/17   [provider]    Allergies:   Penicillins and Tetracyclines & related   Social History   Socioeconomic History  . Marital status: Married    Spouse name: None  . Number of children: None  . Years of education: None  . Highest education level: None  Social Needs  . Financial resource strain: None  . Food insecurity - worry: None  . Food insecurity - inability: None  . Transportation needs - medical: None  . Transportation needs - non-medical: None  Occupational History  . None  Tobacco Use  . Smoking status: Never Smoker  . Smokeless tobacco: Never Used  Substance and Sexual Activity  . Alcohol use: Yes    Comment: social x3-4 week  . Drug use: No  . Sexual activity: Yes    Birth control/protection: None  Other Topics Concern  . None  Social History Narrative  . None     Family History:  The patient's family history includes Breast cancer in her mother; Heart attack in her paternal grandfather; Stroke in her father.   ROS:   Please see the history of present illness.    ROS All other systems reviewed and are negative.   PHYSICAL EXAM:   VS:  BP 108/60   Pulse 64   Resp 16   Ht 5\' 7"  (1.702 m)   Wt 157 lb (71.2 kg)   SpO2 99%   BMI 24.59 kg/m    GEN: Well nourished, well developed, in no acute distress  HEENT: normal  Neck: no JVD, carotid bruits, or masses Cardiac: RRR; no murmurs, rubs, or gallops,no edema  Respiratory:  clear to auscultation bilaterally, normal work of breathing GI: soft, nontender, nondistended, + BS MS: no deformity or atrophy  Skin: warm and dry, no rash Neuro:  Alert and Oriented x 3, Strength and sensation are intact Psych: euthymic mood, full affect  Wt Readings from  Last 3 Encounters:  09/20/17 157 lb (71.2 kg)  09/01/17 156 lb 4.9 oz (70.9 kg)  07/12/16 158 lb 8 oz (71.9 kg)      Studies/Labs Reviewed:   EKG:  EKG is not  ordered today.    Recent Labs: 09/01/2017: ALT 31; TSH 7.632 09/03/2017: BUN 8; Creatinine, Ser 0.95; Hemoglobin 14.2; Platelets 192; Potassium 4.1; Sodium 138   Lipid Panel    Component Value Date/Time   CHOL 189 08/31/2017 1852   TRIG 60 08/31/2017 1852   HDL 72 08/31/2017 1852   CHOLHDL 2.6 08/31/2017 1852   VLDL 12 08/31/2017 1852   LDLCALC 105 (H) 08/31/2017 1852    Additional studies/ records that were reviewed today include:   Cardiac Cath 09/03/17:  Conclusions: 1. 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 No significant disease to explain patient's chest  pain. 2. Normal to hyperdynamic left ventricular contraction with normal filling pressure.  Recommendations: 1. Medical therapy for possible vasospasm and/or microvascular dysfunction. Will discontinue nitroglycerin infusion and initiate isosorbide mononitrate. 2. If chest pain continues, consider work-up for non-cardiac causes. 3. If chest pain free and weaned off nitroglycerin infusion, consider discharge later this afternoon or tomorrow.      ASSESSMENT & PLAN:    1. Mild non obstructive CAD -Continue aspirin and statin. Imdur discontinued due to headache.  Likely her symptoms from a suspicious spasm rather than cardiac vasospasm.  See below. Statin will be followed by PCP. Plan to DC/Reduce Zocor in few months.    2.  Epigastric pain -Likely GI etiology.  Symptoms improving after increasing Prilosec.  Plan for EGD in upcoming weeks.    3.  Hypertension -Stable on current regimen.  Medication Adjustments/Labs and Tests Ordered: Current medicines are reviewed at length with the patient today.  Concerns regarding medicines are outlined above.  Medication changes, Labs and Tests ordered today are listed in the  Patient Instructions below. Patient Instructions  Medication Instructions:   Your physician recommends that you continue on your current medications as directed. Please refer to the Current Medication list given to you today.   If you need a refill on your cardiac medications before your next appointment, please call your pharmacy.  Labwork: NONE ORDERED  TODAY     Testing/Procedures: NONE ORDERED  TODAY    Follow-Up: CONTACT [facility] [phone] AS NEEDED FOR  ANY CARDIAC RELATED SYMPTOMS    Any Other Special Instructions Will Be Listed Below (If Applicable).                                                                                                                                                      Jarrett Soho, [place]  09/20/2017 8:30 AM    [address], [place], [place]  [address] Phone: [phone]; Fax: [phone]

## 2017-09-20 ENCOUNTER — Encounter: Payer: Self-pay | Admitting: Physician Assistant

## 2017-09-20 ENCOUNTER — Ambulatory Visit: Payer: Medicare HMO | Admitting: Physician Assistant

## 2017-09-20 VITALS — BP 108/60 | HR 64 | Resp 16 | Ht 67.0 in | Wt 157.0 lb

## 2017-09-20 DIAGNOSIS — R1013 Epigastric pain: Secondary | ICD-10-CM

## 2017-09-20 DIAGNOSIS — I2510 Atherosclerotic heart disease of native coronary artery without angina pectoris: Secondary | ICD-10-CM

## 2017-09-20 DIAGNOSIS — I10 Essential (primary) hypertension: Secondary | ICD-10-CM

## 2017-09-20 NOTE — Patient Instructions (Signed)

## 2017-10-01 DIAGNOSIS — H2513 Age-related nuclear cataract, bilateral: Secondary | ICD-10-CM | POA: Diagnosis not present

## 2017-10-01 DIAGNOSIS — H40013 Open angle with borderline findings, low risk, bilateral: Secondary | ICD-10-CM | POA: Diagnosis not present

## 2017-10-08 DIAGNOSIS — K640 First degree hemorrhoids: Secondary | ICD-10-CM | POA: Diagnosis not present

## 2017-10-08 DIAGNOSIS — K219 Gastro-esophageal reflux disease without esophagitis: Secondary | ICD-10-CM | POA: Diagnosis not present

## 2017-10-08 DIAGNOSIS — L219 Seborrheic dermatitis, unspecified: Secondary | ICD-10-CM | POA: Diagnosis not present

## 2017-10-08 DIAGNOSIS — K5140 Inflammatory polyps of colon without complications: Secondary | ICD-10-CM | POA: Diagnosis not present

## 2017-10-08 DIAGNOSIS — D126 Benign neoplasm of colon, unspecified: Secondary | ICD-10-CM | POA: Diagnosis not present

## 2017-10-08 DIAGNOSIS — Z1211 Encounter for screening for malignant neoplasm of colon: Secondary | ICD-10-CM | POA: Diagnosis not present

## 2017-10-08 DIAGNOSIS — R1310 Dysphagia, unspecified: Secondary | ICD-10-CM | POA: Diagnosis not present

## 2017-10-10 DIAGNOSIS — M549 Dorsalgia, unspecified: Secondary | ICD-10-CM | POA: Diagnosis not present

## 2017-10-10 DIAGNOSIS — G894 Chronic pain syndrome: Secondary | ICD-10-CM | POA: Diagnosis not present

## 2017-10-10 DIAGNOSIS — M792 Neuralgia and neuritis, unspecified: Secondary | ICD-10-CM | POA: Diagnosis not present

## 2017-10-10 DIAGNOSIS — G5780 Other specified mononeuropathies of unspecified lower limb: Secondary | ICD-10-CM | POA: Diagnosis not present

## 2017-10-11 DIAGNOSIS — Z1211 Encounter for screening for malignant neoplasm of colon: Secondary | ICD-10-CM | POA: Diagnosis not present

## 2017-10-11 DIAGNOSIS — L219 Seborrheic dermatitis, unspecified: Secondary | ICD-10-CM | POA: Diagnosis not present

## 2017-10-11 DIAGNOSIS — D126 Benign neoplasm of colon, unspecified: Secondary | ICD-10-CM | POA: Diagnosis not present

## 2017-10-11 DIAGNOSIS — K5140 Inflammatory polyps of colon without complications: Secondary | ICD-10-CM | POA: Diagnosis not present

## 2017-11-05 DIAGNOSIS — M792 Neuralgia and neuritis, unspecified: Secondary | ICD-10-CM | POA: Diagnosis not present

## 2017-11-05 DIAGNOSIS — Z79899 Other long term (current) drug therapy: Secondary | ICD-10-CM | POA: Diagnosis not present

## 2017-11-05 DIAGNOSIS — Z5181 Encounter for therapeutic drug level monitoring: Secondary | ICD-10-CM | POA: Diagnosis not present

## 2017-11-06 DIAGNOSIS — E7800 Pure hypercholesterolemia, unspecified: Secondary | ICD-10-CM | POA: Diagnosis not present

## 2017-11-12 DIAGNOSIS — E7800 Pure hypercholesterolemia, unspecified: Secondary | ICD-10-CM | POA: Diagnosis not present

## 2017-11-12 DIAGNOSIS — E559 Vitamin D deficiency, unspecified: Secondary | ICD-10-CM | POA: Diagnosis not present

## 2017-11-12 DIAGNOSIS — R739 Hyperglycemia, unspecified: Secondary | ICD-10-CM | POA: Diagnosis not present

## 2017-11-27 DIAGNOSIS — R42 Dizziness and giddiness: Secondary | ICD-10-CM | POA: Diagnosis not present

## 2017-11-27 DIAGNOSIS — R1031 Right lower quadrant pain: Secondary | ICD-10-CM | POA: Diagnosis not present

## 2017-11-27 DIAGNOSIS — R1011 Right upper quadrant pain: Secondary | ICD-10-CM | POA: Diagnosis not present

## 2017-11-27 DIAGNOSIS — J111 Influenza due to unidentified influenza virus with other respiratory manifestations: Secondary | ICD-10-CM | POA: Diagnosis not present

## 2017-11-29 DIAGNOSIS — R1011 Right upper quadrant pain: Secondary | ICD-10-CM | POA: Diagnosis not present

## 2017-12-17 DIAGNOSIS — G894 Chronic pain syndrome: Secondary | ICD-10-CM | POA: Diagnosis not present

## 2017-12-17 DIAGNOSIS — G8929 Other chronic pain: Secondary | ICD-10-CM | POA: Diagnosis not present

## 2017-12-17 DIAGNOSIS — M792 Neuralgia and neuritis, unspecified: Secondary | ICD-10-CM | POA: Diagnosis not present

## 2017-12-17 DIAGNOSIS — G5780 Other specified mononeuropathies of unspecified lower limb: Secondary | ICD-10-CM | POA: Diagnosis not present

## 2017-12-17 DIAGNOSIS — M7918 Myalgia, other site: Secondary | ICD-10-CM | POA: Diagnosis not present

## 2017-12-17 DIAGNOSIS — M25562 Pain in left knee: Secondary | ICD-10-CM | POA: Diagnosis not present

## 2018-02-01 DIAGNOSIS — Z881 Allergy status to other antibiotic agents status: Secondary | ICD-10-CM | POA: Diagnosis not present

## 2018-02-01 DIAGNOSIS — Z8249 Family history of ischemic heart disease and other diseases of the circulatory system: Secondary | ICD-10-CM | POA: Diagnosis not present

## 2018-02-01 DIAGNOSIS — Z803 Family history of malignant neoplasm of breast: Secondary | ICD-10-CM | POA: Diagnosis not present

## 2018-02-01 DIAGNOSIS — Z823 Family history of stroke: Secondary | ICD-10-CM | POA: Diagnosis not present

## 2018-02-01 DIAGNOSIS — K219 Gastro-esophageal reflux disease without esophagitis: Secondary | ICD-10-CM | POA: Diagnosis not present

## 2018-02-01 DIAGNOSIS — I10 Essential (primary) hypertension: Secondary | ICD-10-CM | POA: Diagnosis not present

## 2018-02-01 DIAGNOSIS — Z880 Allergy status to penicillin: Secondary | ICD-10-CM | POA: Diagnosis not present

## 2018-02-01 DIAGNOSIS — E785 Hyperlipidemia, unspecified: Secondary | ICD-10-CM | POA: Diagnosis not present

## 2018-02-01 DIAGNOSIS — G8929 Other chronic pain: Secondary | ICD-10-CM | POA: Diagnosis not present

## 2018-02-01 DIAGNOSIS — M792 Neuralgia and neuritis, unspecified: Secondary | ICD-10-CM | POA: Diagnosis not present

## 2018-03-11 DIAGNOSIS — G5780 Other specified mononeuropathies of unspecified lower limb: Secondary | ICD-10-CM | POA: Diagnosis not present

## 2018-03-11 DIAGNOSIS — M792 Neuralgia and neuritis, unspecified: Secondary | ICD-10-CM | POA: Diagnosis not present

## 2018-03-11 DIAGNOSIS — M25562 Pain in left knee: Secondary | ICD-10-CM | POA: Diagnosis not present

## 2018-03-11 DIAGNOSIS — G894 Chronic pain syndrome: Secondary | ICD-10-CM | POA: Diagnosis not present

## 2018-03-18 ENCOUNTER — Other Ambulatory Visit: Payer: Self-pay | Admitting: Cardiovascular Disease

## 2018-03-18 MED ORDER — LOSARTAN POTASSIUM 25 MG PO TABS: 25.0000 mg | ORAL_TABLET | Freq: Every day | ORAL | 1 refills | Status: DC

## 2018-03-22 ENCOUNTER — Other Ambulatory Visit: Payer: Self-pay | Admitting: *Deleted

## 2018-03-22 MED ORDER — LOSARTAN POTASSIUM 25 MG PO TABS: 25.0000 mg | ORAL_TABLET | Freq: Every day | ORAL | 4 refills | Status: DC

## 2018-05-20 DIAGNOSIS — E7800 Pure hypercholesterolemia, unspecified: Secondary | ICD-10-CM | POA: Diagnosis not present

## 2018-05-20 DIAGNOSIS — E559 Vitamin D deficiency, unspecified: Secondary | ICD-10-CM | POA: Diagnosis not present

## 2018-05-22 DIAGNOSIS — K219 Gastro-esophageal reflux disease without esophagitis: Secondary | ICD-10-CM | POA: Diagnosis not present

## 2018-05-22 DIAGNOSIS — E7800 Pure hypercholesterolemia, unspecified: Secondary | ICD-10-CM | POA: Diagnosis not present

## 2018-05-22 DIAGNOSIS — E559 Vitamin D deficiency, unspecified: Secondary | ICD-10-CM | POA: Diagnosis not present

## 2018-05-22 DIAGNOSIS — R739 Hyperglycemia, unspecified: Secondary | ICD-10-CM | POA: Diagnosis not present

## 2018-06-27 DIAGNOSIS — G5782 Other specified mononeuropathies of left lower limb: Secondary | ICD-10-CM | POA: Diagnosis not present

## 2018-06-27 DIAGNOSIS — M792 Neuralgia and neuritis, unspecified: Secondary | ICD-10-CM | POA: Diagnosis not present

## 2018-07-22 DIAGNOSIS — M1712 Unilateral primary osteoarthritis, left knee: Secondary | ICD-10-CM | POA: Diagnosis not present

## 2018-07-22 DIAGNOSIS — Z881 Allergy status to other antibiotic agents status: Secondary | ICD-10-CM | POA: Diagnosis not present

## 2018-07-22 DIAGNOSIS — Z880 Allergy status to penicillin: Secondary | ICD-10-CM | POA: Diagnosis not present

## 2018-07-22 DIAGNOSIS — T84093A Other mechanical complication of internal left knee prosthesis, initial encounter: Secondary | ICD-10-CM | POA: Diagnosis not present

## 2018-07-22 DIAGNOSIS — G8929 Other chronic pain: Secondary | ICD-10-CM | POA: Diagnosis not present

## 2018-07-22 DIAGNOSIS — M25562 Pain in left knee: Secondary | ICD-10-CM | POA: Diagnosis not present

## 2018-07-22 DIAGNOSIS — M792 Neuralgia and neuritis, unspecified: Secondary | ICD-10-CM | POA: Diagnosis not present

## 2018-07-22 DIAGNOSIS — G5782 Other specified mononeuropathies of left lower limb: Secondary | ICD-10-CM | POA: Diagnosis not present

## 2018-07-24 DIAGNOSIS — Z23 Encounter for immunization: Secondary | ICD-10-CM | POA: Diagnosis not present

## 2018-07-24 DIAGNOSIS — M7701 Medial epicondylitis, right elbow: Secondary | ICD-10-CM | POA: Diagnosis not present

## 2018-07-24 DIAGNOSIS — K219 Gastro-esophageal reflux disease without esophagitis: Secondary | ICD-10-CM | POA: Diagnosis not present

## 2018-08-06 DIAGNOSIS — M25521 Pain in right elbow: Secondary | ICD-10-CM | POA: Diagnosis not present

## 2018-08-16 DIAGNOSIS — M25521 Pain in right elbow: Secondary | ICD-10-CM | POA: Diagnosis not present

## 2018-08-20 DIAGNOSIS — M25521 Pain in right elbow: Secondary | ICD-10-CM | POA: Diagnosis not present

## 2018-08-23 DIAGNOSIS — M25521 Pain in right elbow: Secondary | ICD-10-CM | POA: Diagnosis not present

## 2018-09-02 DIAGNOSIS — M7701 Medial epicondylitis, right elbow: Secondary | ICD-10-CM | POA: Diagnosis not present

## 2018-09-02 DIAGNOSIS — M25521 Pain in right elbow: Secondary | ICD-10-CM | POA: Diagnosis not present

## 2018-09-10 DIAGNOSIS — M25521 Pain in right elbow: Secondary | ICD-10-CM | POA: Diagnosis not present

## 2018-09-16 DIAGNOSIS — M792 Neuralgia and neuritis, unspecified: Secondary | ICD-10-CM | POA: Diagnosis not present

## 2018-09-16 DIAGNOSIS — G5780 Other specified mononeuropathies of unspecified lower limb: Secondary | ICD-10-CM | POA: Diagnosis not present

## 2018-09-16 DIAGNOSIS — Z79899 Other long term (current) drug therapy: Secondary | ICD-10-CM | POA: Diagnosis not present

## 2018-09-16 DIAGNOSIS — G894 Chronic pain syndrome: Secondary | ICD-10-CM | POA: Diagnosis not present

## 2018-09-16 DIAGNOSIS — Z5181 Encounter for therapeutic drug level monitoring: Secondary | ICD-10-CM | POA: Diagnosis not present

## 2018-09-17 DIAGNOSIS — Z124 Encounter for screening for malignant neoplasm of cervix: Secondary | ICD-10-CM | POA: Diagnosis not present

## 2018-09-17 DIAGNOSIS — Z01419 Encounter for gynecological examination (general) (routine) without abnormal findings: Secondary | ICD-10-CM | POA: Diagnosis not present

## 2018-09-17 DIAGNOSIS — Z1389 Encounter for screening for other disorder: Secondary | ICD-10-CM | POA: Diagnosis not present

## 2018-09-17 DIAGNOSIS — Z681 Body mass index (BMI) 19 or less, adult: Secondary | ICD-10-CM | POA: Diagnosis not present

## 2018-10-01 DIAGNOSIS — M7701 Medial epicondylitis, right elbow: Secondary | ICD-10-CM | POA: Diagnosis not present

## 2018-12-12 DIAGNOSIS — M25521 Pain in right elbow: Secondary | ICD-10-CM | POA: Diagnosis not present

## 2018-12-12 DIAGNOSIS — M7701 Medial epicondylitis, right elbow: Secondary | ICD-10-CM | POA: Diagnosis not present

## 2018-12-26 DIAGNOSIS — M25521 Pain in right elbow: Secondary | ICD-10-CM | POA: Diagnosis not present

## 2018-12-26 DIAGNOSIS — M7701 Medial epicondylitis, right elbow: Secondary | ICD-10-CM | POA: Diagnosis not present

## 2019-01-01 DIAGNOSIS — G5780 Other specified mononeuropathies of unspecified lower limb: Secondary | ICD-10-CM | POA: Diagnosis not present

## 2019-01-01 DIAGNOSIS — G894 Chronic pain syndrome: Secondary | ICD-10-CM | POA: Diagnosis not present

## 2019-01-01 DIAGNOSIS — M792 Neuralgia and neuritis, unspecified: Secondary | ICD-10-CM | POA: Diagnosis not present

## 2019-01-16 DIAGNOSIS — M7701 Medial epicondylitis, right elbow: Secondary | ICD-10-CM | POA: Diagnosis not present

## 2019-01-22 DIAGNOSIS — Z471 Aftercare following joint replacement surgery: Secondary | ICD-10-CM | POA: Diagnosis not present

## 2019-01-22 DIAGNOSIS — Z96653 Presence of artificial knee joint, bilateral: Secondary | ICD-10-CM | POA: Diagnosis not present

## 2019-01-22 DIAGNOSIS — M25562 Pain in left knee: Secondary | ICD-10-CM | POA: Diagnosis not present

## 2019-01-31 DIAGNOSIS — Z881 Allergy status to other antibiotic agents status: Secondary | ICD-10-CM | POA: Diagnosis not present

## 2019-01-31 DIAGNOSIS — I10 Essential (primary) hypertension: Secondary | ICD-10-CM | POA: Diagnosis not present

## 2019-01-31 DIAGNOSIS — M792 Neuralgia and neuritis, unspecified: Secondary | ICD-10-CM | POA: Diagnosis not present

## 2019-01-31 DIAGNOSIS — M1990 Unspecified osteoarthritis, unspecified site: Secondary | ICD-10-CM | POA: Diagnosis not present

## 2019-01-31 DIAGNOSIS — G8929 Other chronic pain: Secondary | ICD-10-CM | POA: Diagnosis not present

## 2019-01-31 DIAGNOSIS — Z7952 Long term (current) use of systemic steroids: Secondary | ICD-10-CM | POA: Diagnosis not present

## 2019-01-31 DIAGNOSIS — K219 Gastro-esophageal reflux disease without esophagitis: Secondary | ICD-10-CM | POA: Diagnosis not present

## 2019-01-31 DIAGNOSIS — E785 Hyperlipidemia, unspecified: Secondary | ICD-10-CM | POA: Diagnosis not present

## 2019-01-31 DIAGNOSIS — Z96653 Presence of artificial knee joint, bilateral: Secondary | ICD-10-CM | POA: Diagnosis not present

## 2019-01-31 DIAGNOSIS — Z880 Allergy status to penicillin: Secondary | ICD-10-CM | POA: Diagnosis not present

## 2019-02-06 DIAGNOSIS — M25469 Effusion, unspecified knee: Secondary | ICD-10-CM | POA: Diagnosis not present

## 2019-02-06 DIAGNOSIS — Z96652 Presence of left artificial knee joint: Secondary | ICD-10-CM | POA: Diagnosis not present

## 2019-02-06 DIAGNOSIS — M25562 Pain in left knee: Secondary | ICD-10-CM | POA: Diagnosis not present

## 2019-02-06 DIAGNOSIS — G8929 Other chronic pain: Secondary | ICD-10-CM | POA: Diagnosis not present

## 2019-02-13 DIAGNOSIS — M7701 Medial epicondylitis, right elbow: Secondary | ICD-10-CM | POA: Diagnosis not present

## 2019-02-18 DIAGNOSIS — M25562 Pain in left knee: Secondary | ICD-10-CM | POA: Diagnosis not present

## 2019-02-18 DIAGNOSIS — M1990 Unspecified osteoarthritis, unspecified site: Secondary | ICD-10-CM | POA: Diagnosis not present

## 2019-02-18 DIAGNOSIS — M25462 Effusion, left knee: Secondary | ICD-10-CM | POA: Diagnosis not present

## 2019-02-18 DIAGNOSIS — R768 Other specified abnormal immunological findings in serum: Secondary | ICD-10-CM | POA: Diagnosis not present

## 2019-02-18 DIAGNOSIS — R700 Elevated erythrocyte sedimentation rate: Secondary | ICD-10-CM | POA: Diagnosis not present

## 2019-02-20 DIAGNOSIS — M79652 Pain in left thigh: Secondary | ICD-10-CM | POA: Diagnosis not present

## 2019-02-20 DIAGNOSIS — R600 Localized edema: Secondary | ICD-10-CM | POA: Diagnosis not present

## 2019-02-20 DIAGNOSIS — M79605 Pain in left leg: Secondary | ICD-10-CM | POA: Diagnosis not present

## 2019-02-21 DIAGNOSIS — M79605 Pain in left leg: Secondary | ICD-10-CM | POA: Diagnosis not present

## 2019-02-24 ENCOUNTER — Encounter: Payer: Self-pay | Admitting: Orthopaedic Surgery

## 2019-02-24 ENCOUNTER — Ambulatory Visit (INDEPENDENT_AMBULATORY_CARE_PROVIDER_SITE_OTHER): Payer: Medicare HMO

## 2019-02-24 ENCOUNTER — Ambulatory Visit (INDEPENDENT_AMBULATORY_CARE_PROVIDER_SITE_OTHER): Payer: Medicare HMO | Admitting: Orthopaedic Surgery

## 2019-02-24 ENCOUNTER — Other Ambulatory Visit: Payer: Self-pay

## 2019-02-24 DIAGNOSIS — G8929 Other chronic pain: Secondary | ICD-10-CM | POA: Diagnosis not present

## 2019-02-24 DIAGNOSIS — M25562 Pain in left knee: Secondary | ICD-10-CM | POA: Diagnosis not present

## 2019-02-24 NOTE — Progress Notes (Signed)
Office Visit Note   Patient: [name]           Date of Birth: [birth year]           MRN: [id number] Visit Date: 02/24/2019              Requested by: [name], [address] [place],  [place] [address] PCP: [name], MD   Assessment & Plan: Visit Diagnoses:  1. Chronic pain of left knee     Plan: I was able to place some lidocaine into the left knee that gave her relief of her symptoms.  The knee is warm.  I was able to aspirate only about 10 cc of fluid from her knee that showed a large amount of sediment.  Certainly she may have some type of reactive synovitis in her knee.  I will send this off for Gram stain and culture and see her back in just a week to determine what the next option may be for her knee.  All question concerns were answered and addressed.  Follow-Up Instructions: Return in about 1 week (around 03/03/2019).   Orders:  Orders Placed This Encounter  Procedures   XR Knee 1-2 Views Left   No orders of the defined types were placed in this encounter.     Procedures: No procedures performed   Clinical Data: No additional findings.   Subjective: Chief Complaint  Patient presents with   Left Knee - Pain  The patient is a very pleasant 68 year old female comes in for second opinion due to left knee pain and swelling.  She has a complicated history with her knees.  She has a history of bilateral total knee arthroplasties done 3 years ago.  However her left knee has had at least 7 surgeries over the years.  She has had also radiofrequency ablation of the nerves around her knee.  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  She reports that 10 weeks ago she was on a exercise bike and did some sprinting on the bike and after that developed significant left knee pain and swelling that she had not had before.  She also sees a pain specialist.  She said the other surgeon in town did attempt to aspirate fluid from her knee and was  unsuccessful.  He tried a steroid taper as well as Neurontin.  She comes here today for [place] to take a look at her knee from a different perspective she feels.  She is not a smoker not a diabetic.  HPI  Review of Systems She currently denies any headache, chest pain, shortness of breath, fever, chills, nausea, vomiting  Objective: Vital Signs: There were no vitals taken for this visit.  Physical Exam She is alert and orient x3 and in no acute distress Ortho Exam Examination of both her knees show well-healed surgical incisions.  The right knee has no effusion at all.  The left knee appears to have a large swollen effusion.  Some of this may be just bone swelling and not a true effusion and may just be soft tissue related.  She has multiple scars on that left knee from previous surgeries.  Her flexion and extension are limited.  I cannot flex her to 90 degrees.  She states that before 10 weeks ago she can only flex to about 100 degrees and this is been a chronic issue due to her multiple surgeries.  She reports that 10 weeks ago  she was not having significant issues with that knee. Specialty Comments:  No specialty comments available.  Imaging: Xr Knee 1-2 Views Left  Result Date: 02/24/2019 2 views of the left knee show a total knee arthroplasty with no evidence of loosening or complicating features.  There is no malalignment.  There is retained hardware laterally from a previous ligamentous reconstruction.  There is fullness of the soft tissue.  There is no evidence of osteolysis.    PMFS History: Patient Active Problem List   Diagnosis Date Noted   Essential hypertension 09/03/2017   Unstable angina ([place]) 08/31/2017   S/p total knee replacement, bilateral 07/07/2016   Constipation due to pain medication    Post-operative pain    Abnormality of gait    Acute blood loss anemia 07/06/2016   S/P bilateral TKAs 07/03/2016   Past Medical History:  Diagnosis Date   Arthritis      osteoarthritis knees mostly   Chest pain    a. 08/2017 MV: mid-basilar anteroseptal ischemia;  b. 08/2017 Cath: LM nl, LAD 31m, D2 30, RI nl, LCX 20ost, RCA large, nl, EF 65%.   Diastolic dysfunction    a. 08/2017 Echo: EF 60-65%, no rwma, Gr1 DD.    Family History  Problem Relation Age of Onset   Breast cancer Mother    Stroke Father    Heart attack Paternal Grandfather     Past Surgical History:  Procedure Laterality Date   DIAGNOSTIC LAPAROSCOPY     ovarian cystectomy/ BSO done.   HARDWARE REMOVAL Left 07/03/2016   Procedure: LEFT KNEE HARDWARE REMOVAL;  Surgeon: Paralee Cancel, MD;  Location: [facility];  Service: Orthopedics;  Laterality: Left;   KNEE SURGERY Bilateral    ACL x2, meniscectomy x3, open patella(bone from other knee)   LEFT HEART CATH AND CORONARY ANGIOGRAPHY N/A 09/03/2017   Procedure: LEFT HEART CATH AND CORONARY ANGIOGRAPHY;  Surgeon: Nelva Bush, MD;  Location: [facility];  Service: Cardiovascular;  Laterality: N/A;   ROTATOR CUFF REPAIR Right    TOTAL KNEE ARTHROPLASTY Bilateral 07/03/2016   Procedure: TOTAL KNEE BILATERAL ARTHROPLASTY;  Surgeon: Paralee Cancel, MD;  Location: [facility];  Service: Orthopedics;  Laterality: Bilateral;   Social History   Occupational History   Not on file  Tobacco Use   Smoking status: Never Smoker   Smokeless tobacco: Never Used  Substance and Sexual Activity   Alcohol use: Yes    Comment: social x3-4 week   Drug use: No   Sexual activity: Yes    Birth control/protection: None

## 2019-02-24 NOTE — Addendum Note (Signed)
Addended by: Jacklyn Shell on: 02/24/2019 02:26 PM   Modules accepted: Orders

## 2019-02-26 LAB — GRAM STAIN
MICRO NUMBER:: 593456
SPECIMEN QUALITY:: ADEQUATE

## 2019-02-26 LAB — SYNOVIAL CELL COUNT + DIFF, W/ CRYSTALS
Basophils, %: 0 %
Eosinophils-Synovial: 0 % (ref 0–2)
Lymphocytes-Synovial Fld: 2 % (ref 0–74)
Monocyte/Macrophage: 5 % (ref 0–69)
Neutrophil, Synovial: 93 % — ABNORMAL HIGH (ref 0–24)
Synoviocytes, %: 0 % (ref 0–15)
WBC, Synovial: 17260 cells/uL — ABNORMAL HIGH (ref <150)

## 2019-03-01 LAB — BODY FLUID CULTURE

## 2019-03-03 ENCOUNTER — Other Ambulatory Visit: Payer: Self-pay

## 2019-03-03 ENCOUNTER — Encounter: Payer: Self-pay | Admitting: Orthopaedic Surgery

## 2019-03-03 ENCOUNTER — Ambulatory Visit (INDEPENDENT_AMBULATORY_CARE_PROVIDER_SITE_OTHER): Payer: Medicare HMO | Admitting: Orthopaedic Surgery

## 2019-03-03 DIAGNOSIS — M659 Synovitis and tenosynovitis, unspecified: Secondary | ICD-10-CM | POA: Diagnosis not present

## 2019-03-03 DIAGNOSIS — M25462 Effusion, left knee: Secondary | ICD-10-CM

## 2019-03-03 NOTE — Progress Notes (Signed)
The patient is following up 1 week after I aspirated her left knee.  This is the knee that 1 of my colleagues in town replaced back in 2017.  She has a complex history with that knee.  She has had at least 7 operations over the years with that knee.  In 2018 she had to have a neuro lysis due to chronic pain around the knee.  She had bilateral knee replacements and the right knee is never been problematic.  About 10 weeks ago she had significant swelling in her left knee after some type of exercise activity.  I saw her in the office last week and was able to aspirate the knee and obtained only a small amount of fluid but there was significant sediment in the fluid.  She has not been on antibiotics and has not been sick feeling.  She denies any fevers and chills.  The fluid collection showed 17,000 white cells.  There was no organisms seen and the cultures remain negative after 5 days.  She comes in the office today with still a very large swollen and painful left knee.  On exam it is warm but not red.  She is not septic appearing and denies any fever and chills.  The knee hurts on exam.  I did tell her I am concerned about the white count being 17,000.  I feel partially that she needs an open synovectomy with new cultures obtained combined with a polyliner exchange and placement of antibiotic beads to treat her knee thoroughly.  I have explained this to her in detail and she understands this and would like for me to proceed with the surgery.  I do not feel that this is something we can wait on and we need to get her set up for surgery at least by next week.  I had a long and thorough discussion of what surgery would involve including the risk and benefits of surgery.  I showed her knee model and explained in detail what we would do.  Obviously the worst case scenario would be to remove all components in place antibiotic spacer but hopefully we will not need to go that far in terms of this neck surgery.  I do not  feel an arthroscopic intervention is warranted and could do enough to fully assess the knee given the high white blood cell count and likely the thickened synovium that she has they cannot be addressed with just an arthroscopic intervention.  I feel an open surgery is warranted.  She agrees with this.  All question concerns were answered and addressed.  We will work on getting her surgery scheduled and then see her back in 2 weeks postoperative.

## 2019-03-04 ENCOUNTER — Encounter: Payer: Self-pay | Admitting: Orthopaedic Surgery

## 2019-03-04 DIAGNOSIS — G5780 Other specified mononeuropathies of unspecified lower limb: Secondary | ICD-10-CM | POA: Diagnosis not present

## 2019-03-04 DIAGNOSIS — G894 Chronic pain syndrome: Secondary | ICD-10-CM | POA: Diagnosis not present

## 2019-03-04 DIAGNOSIS — M792 Neuralgia and neuritis, unspecified: Secondary | ICD-10-CM | POA: Diagnosis not present

## 2019-03-10 ENCOUNTER — Other Ambulatory Visit (HOSPITAL_COMMUNITY): Payer: Self-pay | Admitting: *Deleted

## 2019-03-10 ENCOUNTER — Other Ambulatory Visit: Payer: Self-pay | Admitting: Physician Assistant

## 2019-03-10 NOTE — Patient Instructions (Addendum)
YOU NEED TO HAVE A COVID 19 TEST ON_______ @_______ , THIS TEST MUST BE DONE BEFORE SURGERY, COME TO [facility] ENTRANCE. ONCE YOUR COVID TEST IS COMPLETED, PLEASE BEGIN THE QUARANTINE INSTRUCTIONS AS OUTLINED IN YOUR HANDOUT.                Amanda DRUMGOOLE    Your procedure is scheduled on: 03-14-2019  Report to [facility] Main  Entrance              Report to admitting at 1155 AM      Call this number if you have problems the morning of surgery [phone]    Remember: Do not eat food :After Midnight. CLEAR LIQUIDS FROM MIDNIGHT UNTIL 1125 AM.NOTHING BY MOUTH AFTER  1125 am  . BRUSH YOUR TEETH MORNING OF SURGERY AND RINSE YOUR MOUTH OUT, NO CHEWING GUM CANDY OR MINTS.   NO SOLID FOOD AFTER MIDNIGHT THE NIGHT PRIOR TO SURGERY. NOTHING BY MOUTH EXCEPT CLEAR LIQUIDS UNTIL 3 HOURS PRIOR TO [place] SURGERY. PLEASE FINISH ENSURE DRINK PER SURGEON ORDER 3 HOURS PRIOR TO SCHEDULED SURGERY TIME WHICH NEEDS TO BE COMPLETED AT __1125 am then nothing by mouth__________.   Take these medicines the morning of surgery with A SIP OF WATER: omeprazole, cymbalta                               You may not have any metal on your body including hair pins and              piercings  Do not wear jewelry, make-up, lotions, powders or perfumes, deodorant             Do not wear nail polish.  Do not shave  48 hours prior to surgery.                 Do not bring valuables to the hospital. [place].  Contacts, dentures or bridgework may not be worn into surgery.      ____________________________________________________________________           [facility] - Preparing for Surgery Before surgery, you can play an important role.  Because skin is not sterile, your skin needs to be as free of germs as possible.  You can reduce the number of germs on your skin by washing with CHG (chlorahexidine gluconate) soap before  surgery.  CHG is an antiseptic cleaner which kills germs and bonds with the skin to continue killing germs even after washing. Please DO NOT use if you have an allergy to CHG or antibacterial soaps.  If your skin becomes reddened/irritated stop using the CHG and inform your nurse when you arrive at Short Stay. Do not shave (including legs and underarms) for at least 48 hours prior to the first CHG shower.  You may shave your face/neck. Please follow these instructions carefully:  1.  Shower with CHG Soap the night before surgery and the  morning of Surgery.  2.  If you choose to wash your hair, wash your hair first as usual with your  normal  shampoo.  3.  After you shampoo, rinse your hair and body thoroughly to remove the  shampoo.  4.  Use CHG as you would any other liquid soap.  You can apply chg directly  to the skin and wash                       Gently with a scrungie or clean washcloth.  5.  Apply the CHG Soap to your body ONLY FROM THE NECK DOWN.   Do not use on face/ open                           Wound or open sores. Avoid contact with eyes, ears mouth and genitals (private parts).                       Wash face,  Genitals (private parts) with your normal soap.             6.  Wash thoroughly, paying special attention to the area where your surgery  will be performed.  7.  Thoroughly rinse your body with warm water from the neck down.  8.  DO NOT shower/wash with your normal soap after using and rinsing off  the CHG Soap.                9.  Pat yourself dry with a clean towel.            10.  Wear clean pajamas.            11.  Place clean sheets on your bed the night of your first shower and do not  sleep with pets. Day of Surgery : Do not apply any lotions/deodorants the morning of surgery.  Please wear clean clothes to the hospital/surgery center.  FAILURE TO FOLLOW THESE INSTRUCTIONS MAY RESULT IN THE CANCELLATION OF YOUR SURGERY PATIENT  SIGNATURE_________________________________  NURSE SIGNATURE__________________________________  ________________________________________________________________________  WHAT IS A BLOOD TRANSFUSION? Blood Transfusion Information  A transfusion is the replacement of blood or some of its parts. Blood is made up of multiple cells which provide different functions.  Red blood cells carry oxygen and are used for blood loss replacement.  White blood cells fight against infection.  Platelets control bleeding.  Plasma helps clot blood.  Other blood products are available for specialized needs, such as hemophilia or other clotting disorders. BEFORE THE TRANSFUSION  Who gives blood for transfusions?   Healthy volunteers who are fully evaluated to make sure their blood is safe. This is blood bank blood. Transfusion therapy is the safest it has ever been in the practice of medicine. Before blood is taken from a donor, a complete history is taken to make sure that person has no history of diseases nor engages in risky social behavior (examples are intravenous drug use or sexual activity with multiple partners). The donor's travel history is screened to minimize risk of transmitting infections, such as malaria. The donated blood is tested for signs of infectious diseases, such as HIV and hepatitis. The blood is then tested to be sure it is compatible with you in order to minimize the chance of a transfusion reaction. If you or a relative donates blood, this is often done in anticipation of surgery and is not appropriate for emergency situations. It takes many days to process the donated blood. RISKS AND COMPLICATIONS Although transfusion therapy is very safe and saves many lives, the main dangers of transfusion include:   Getting an infectious disease.  Developing a transfusion reaction. This  is an allergic reaction to something in the blood you were given. Every precaution is taken to prevent  this. The decision to have a blood transfusion has been considered carefully by your caregiver before blood is given. Blood is not given unless the benefits outweigh the risks. AFTER THE TRANSFUSION  Right after receiving a blood transfusion, you will usually feel much better and more energetic. This is especially true if your red blood cells have gotten low (anemic). The transfusion raises the level of the red blood cells which carry oxygen, and this usually causes an energy increase.  The nurse administering the transfusion will monitor you carefully for complications. HOME CARE INSTRUCTIONS  No special instructions are needed after a transfusion. You may find your energy is better. Speak with your caregiver about any limitations on activity for underlying diseases you may have. SEEK MEDICAL CARE IF:   Your condition is not improving after your transfusion.  You develop redness or irritation at the intravenous (IV) site. SEEK IMMEDIATE MEDICAL CARE IF:  Any of the following symptoms occur over the next 12 hours:  Shaking chills.  You have a temperature by mouth above 102 F (38.9 C), not controlled by medicine.  Chest, back, or muscle pain.  People around you feel you are not acting correctly or are confused.  Shortness of breath or difficulty breathing.  Dizziness and fainting.  You get a rash or develop hives.  You have a decrease in urine output.  Your urine turns a dark color or changes to pink, red, or brown. Any of the following symptoms occur over the next 10 days:  You have a temperature by mouth above 102 F (38.9 C), not controlled by medicine.  Shortness of breath.  Weakness after normal activity.  The white part of the eye turns yellow (jaundice).  You have a decrease in the amount of urine or are urinating less often.  Your urine turns a dark color or changes to pink, red, or brown. Document Released: 08/18/2000 Document Revised: 11/13/2011 Document  Reviewed: 04/06/2008 ExitCare Patient Information 2014 [place].  _______________________________________________________________________  Incentive Spirometer  An incentive spirometer is a tool that can help keep your lungs clear and active. This tool measures how well you are filling your lungs with each breath. Taking long deep breaths may help reverse or decrease the chance of developing breathing (pulmonary) problems (especially infection) following:  A long period of time when you are unable to move or be active. BEFORE THE PROCEDURE   If the spirometer includes an indicator to show your best effort, your nurse or respiratory therapist will set it to a desired goal.  If possible, sit up straight or lean slightly forward. Try not to slouch.  Hold the incentive spirometer in an upright position. INSTRUCTIONS FOR USE  1. Sit on the edge of your bed if possible, or sit up as far as you can in bed or on a chair. 2. Hold the incentive spirometer in an upright position. 3. Breathe out normally. 4. Place the mouthpiece in your mouth and seal your lips tightly around it. 5. Breathe in slowly and as deeply as possible, raising the piston or the ball toward the top of the column. 6. Hold your breath for 3-5 seconds or for as long as possible. Allow the piston or ball to fall to the bottom of the column. 7. Remove the mouthpiece from your mouth and breathe out normally. 8. Rest for a few seconds and repeat Steps 1  through 7 at least 10 times every 1-2 hours when you are awake. Take your time and take a few normal breaths between deep breaths. 9. The spirometer may include an indicator to show your best effort. Use the indicator as a goal to work toward during each repetition. 10. After each set of 10 deep breaths, practice coughing to be sure your lungs are clear. If you have an incision (the cut made at the time of surgery), support your incision when coughing by placing a pillow or  rolled up towels firmly against it. Once you are able to get out of bed, walk around indoors and cough well. You may stop using the incentive spirometer when instructed by your caregiver.  RISKS AND COMPLICATIONS  Take your time so you do not get dizzy or light-headed.  If you are in pain, you may need to take or ask for pain medication before doing incentive spirometry. It is harder to take a deep breath if you are having pain. AFTER USE  Rest and breathe slowly and easily.  It can be helpful to keep track of a log of your progress. Your caregiver can provide you with a simple table to help with this. If you are using the spirometer at home, follow these instructions: [place] IF:   You are having difficultly using the spirometer.  You have trouble using the spirometer as often as instructed.  Your pain medication is not giving enough relief while using the spirometer.  You develop fever of 100.5 F (38.1 C) or higher. SEEK IMMEDIATE MEDICAL CARE IF:   You cough up bloody sputum that had not been present before.  You develop fever of 102 F (38.9 C) or greater.  You develop worsening pain at or near the incision site. MAKE SURE YOU:   Understand these instructions.  Will watch your condition.  Will get help right away if you are not doing well or get worse. Document Released: 01/01/2007 Document Revised: 11/13/2011 Document Reviewed: 03/04/2007 [facility] Patient Information 2014 [place], [place].   ________________________________________________________________________

## 2019-03-10 NOTE — Progress Notes (Signed)
lov cardiology bhavinkumar bhagat pa 09-20-17 epic Echo 09-01-17 epic Cardiac cath 09-03-17 epic

## 2019-03-10 NOTE — Progress Notes (Signed)
NEED ORDERS ASAP FOR 03-14-19 SURGERY. PRE OP IS 800 AM 03-11-19

## 2019-03-11 ENCOUNTER — Other Ambulatory Visit: Payer: Self-pay

## 2019-03-11 ENCOUNTER — Other Ambulatory Visit (HOSPITAL_COMMUNITY)
Admission: RE | Admit: 2019-03-11 | Discharge: 2019-03-11 | Disposition: A | Discharge status: Home / Self Care | Payer: Medicare HMO | Source: Ambulatory Visit | Attending: Orthopaedic Surgery | Admitting: Orthopaedic Surgery

## 2019-03-11 ENCOUNTER — Encounter (HOSPITAL_COMMUNITY): Payer: Self-pay

## 2019-03-11 ENCOUNTER — Encounter (HOSPITAL_COMMUNITY)
Admission: RE | Admit: 2019-03-11 | Discharge: 2019-03-11 | Disposition: A | Discharge status: Home / Self Care | Payer: Medicare HMO | Source: Ambulatory Visit | Attending: Orthopaedic Surgery | Admitting: Orthopaedic Surgery

## 2019-03-11 DIAGNOSIS — Z1159 Encounter for screening for other viral diseases: Secondary | ICD-10-CM | POA: Diagnosis not present

## 2019-03-11 DIAGNOSIS — Z8249 Family history of ischemic heart disease and other diseases of the circulatory system: Secondary | ICD-10-CM | POA: Diagnosis not present

## 2019-03-11 DIAGNOSIS — Z880 Allergy status to penicillin: Secondary | ICD-10-CM | POA: Diagnosis not present

## 2019-03-11 DIAGNOSIS — M65862 Other synovitis and tenosynovitis, left lower leg: Secondary | ICD-10-CM | POA: Diagnosis not present

## 2019-03-11 DIAGNOSIS — M170 Bilateral primary osteoarthritis of knee: Secondary | ICD-10-CM | POA: Diagnosis not present

## 2019-03-11 DIAGNOSIS — Z79899 Other long term (current) drug therapy: Secondary | ICD-10-CM | POA: Diagnosis not present

## 2019-03-11 DIAGNOSIS — Z96653 Presence of artificial knee joint, bilateral: Secondary | ICD-10-CM | POA: Diagnosis not present

## 2019-03-11 DIAGNOSIS — Z888 Allergy status to other drugs, medicaments and biological substances status: Secondary | ICD-10-CM | POA: Diagnosis not present

## 2019-03-11 DIAGNOSIS — I10 Essential (primary) hypertension: Secondary | ICD-10-CM | POA: Diagnosis not present

## 2019-03-11 DIAGNOSIS — I209 Angina pectoris, unspecified: Secondary | ICD-10-CM | POA: Diagnosis not present

## 2019-03-11 HISTORY — DX: Hypokalemia: E87.6

## 2019-03-11 LAB — BASIC METABOLIC PANEL
Anion gap: 9 (ref 5–15)
BUN: 14 mg/dL (ref 8–23)
CO2: 28 mmol/L (ref 22–32)
Calcium: 8.9 mg/dL (ref 8.9–10.3)
Chloride: 103 mmol/L (ref 98–111)
Creatinine, Ser: 0.84 mg/dL (ref 0.44–1.00)
GFR calc Af Amer: >60 mL/min (ref >60)
GFR calc non Af Amer: >60 mL/min (ref >60)
Glucose, Bld: 95 mg/dL (ref 70–99)
Potassium: 4.4 mmol/L (ref 3.5–5.1)
Sodium: 140 mmol/L (ref 135–145)

## 2019-03-11 LAB — SARS CORONAVIRUS 2 (TAT 6-24 HRS): SARS Coronavirus 2: NEGATIVE

## 2019-03-11 LAB — SURGICAL PCR SCREEN
MRSA, PCR: NEGATIVE
Staphylococcus aureus: NEGATIVE

## 2019-03-11 LAB — CBC
HCT: 40.6 % (ref 36.0–46.0)
Hemoglobin: 12.5 g/dL (ref 12.0–15.0)
MCH: 29.4 pg (ref 26.0–34.0)
MCHC: 30.8 g/dL (ref 30.0–36.0)
MCV: 95.5 fL (ref 80.0–100.0)
Platelets: 372 10*3/uL (ref 150–400)
RBC: 4.25 MIL/uL (ref 3.87–5.11)
RDW: 12.2 % (ref 11.5–15.5)
WBC: 6 10*3/uL (ref 4.0–10.5)
nRBC: 0 % (ref 0.0–0.2)

## 2019-03-12 NOTE — Anesthesia Preprocedure Evaluation (Addendum)
Anesthesia Evaluation  Patient identified by MRN, date of birth, ID band Patient awake    Reviewed: Allergy & Precautions, NPO status , Patient's Chart, lab work & pertinent test results  Airway Mallampati: II  TM Distance: >3 FB     Dental   Pulmonary    breath sounds clear to auscultation       Cardiovascular hypertension, + angina  Rhythm:Regular Rate:Normal     Neuro/Psych    GI/Hepatic negative GI ROS, Neg liver ROS,   Endo/Other  negative endocrine ROS  Renal/GU negative Renal ROS     Musculoskeletal  (+) Arthritis ,   Abdominal   Peds  Hematology   Anesthesia Other Findings   Reproductive/Obstetrics                            Anesthesia Physical Anesthesia Plan  ASA: III  Anesthesia Plan: General   Post-op Pain Management:    Induction: Intravenous  PONV Risk Score and Plan: 3 and Ondansetron, Dexamethasone, Midazolam and Treatment may vary due to age or medical condition  Airway Management Planned: Oral ETT  Additional Equipment:   Intra-op Plan:   Post-operative Plan: Extubation in OR  Informed Consent: I have reviewed the patients History and Physical, chart, labs and discussed the procedure including the risks, benefits and alternatives for the proposed anesthesia with the patient or authorized representative who has indicated his/her understanding and acceptance.     Dental advisory given  Plan Discussed with: CRNA, Anesthesiologist and Surgeon  Anesthesia Plan Comments: (See PAT note 03/11/2019, Konrad Felix, PA-C)      Anesthesia Quick Evaluation

## 2019-03-12 NOTE — Progress Notes (Signed)
Anesthesia Chart Review   Case: [id number] Date/Time: 03/14/19 1410   Procedure: IRRIGATION AND DEBRIDEMENT, OPEN SYNOVECTOMY LEFT KNEE WITH POLY EXCHANGE (Left )   Anesthesia type: Choice   Pre-op diagnosis: Synovitis left total knee   [facility] ROOM 10 / [facility]   Surgeon: Mcarthur Rossetti, MD      DISCUSSION:68 y.o. never smoker with h/o synovitis left total knee scheduled for above procedure 03/14/2019 with Dr. Jean Rosenthal.   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  Last seen by cardiology 09/20/17.  Seen by Leanor Kail, PA-C.  Stable at this visit, follow up as needed.   Anticipate pt can proceed with planned procedure barring acute status change.   VS: BP 116/76 (BP Location: Right Arm)   Pulse 91   Temp 36.9 C (Oral)   Resp 18   Ht 5\' 7"  (1.702 m)   Wt 67.2 kg   SpO2 100%   BMI 23.20 kg/m   PROVIDERS: [name], MD is PCP    LABS: Labs reviewed: Acceptable for surgery. (all labs ordered are listed, but only abnormal results are displayed)  Labs Reviewed  SURGICAL PCR SCREEN  BASIC METABOLIC PANEL  CBC     IMAGES:   EKG: 03/11/2019 Rate 84 bpm Normal sinus rhythm   CV: Cardiac Cath 09/03/2017 Conclusions: 1. 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 No significant disease to explain patient's chest pain. 2. Normal to hyperdynamic left ventricular contraction with normal filling pressure.  Recommendations: 1. Medical therapy for possible vasospasm and/or microvascular dysfunction. Will discontinue nitroglycerin infusion and initiate isosorbide mononitrate. 2. If chest pain continues, consider work-up for non-cardiac causes. 3. If chest pain free and weaned off nitroglycerin infusion, consider discharge later this afternoon or tomorrow.  Echo 09/01/17 Study Conclusions  - Left ventricle: The cavity size was normal. Wall thickness was   normal.  Systolic function was normal. The estimated ejection   fraction was in the range of 60% to 65%. Wall motion was normal;   there were no regional wall motion abnormalities. Doppler   parameters are consistent with abnormal left ventricular   relaxation (grade 1 diastolic dysfunction).  Myocardial Perfusion 09/01/17 IMPRESSION: 1. Medium size area of reversibility involving the mid to basilar segment of the anteroseptal wall. Suspicious for inducible ischemia.  2. Normal left ventricular wall motion.  3. Left ventricular ejection fraction 66%  4. Non invasive risk stratification*: Intermediate Past Medical History:  Diagnosis Date  . Arthritis    osteoarthritis knees mostly  . Chest pain    a. 08/2017 MV: mid-basilar anteroseptal ischemia;  b. 08/2017 Cath: LM nl, LAD 70m, D2 30, RI nl, LCX 20ost, RCA large, nl, EF 65%.  . Diastolic dysfunction    a. 08/2017 Echo: EF 60-65%, no rwma, Gr1 DD.  Marland Kitchen Hypokalemia    takes losartan for this    Past Surgical History:  Procedure Laterality Date  . DIAGNOSTIC LAPAROSCOPY     ovarian cystectomy/ BSO done.  Marland Kitchen HARDWARE REMOVAL Left 07/03/2016   Procedure: LEFT KNEE HARDWARE REMOVAL;  Surgeon: Paralee Cancel, MD;  Location: [facility];  Service: Orthopedics;  Laterality: Left;  . KNEE SURGERY Bilateral    ACL x2, meniscectomy x3, open patella(bone from other knee)  . LEFT HEART CATH AND CORONARY ANGIOGRAPHY N/A 09/03/2017   Procedure: LEFT HEART CATH AND CORONARY ANGIOGRAPHY;  Surgeon: Nelva Bush, MD;  Location: [facility];  Service: Cardiovascular;  Laterality: N/A;  .  ROTATOR CUFF REPAIR Right   . TOTAL KNEE ARTHROPLASTY Bilateral 07/03/2016   Procedure: TOTAL KNEE BILATERAL ARTHROPLASTY;  Surgeon: Paralee Cancel, MD;  [facility];  Service: Orthopedics;  Laterality: Bilateral;    MEDICATIONS: . aspirin-acetaminophen-caffeine (EXCEDRIN MIGRAINE) 250-250-65 MG tablet  . Cholecalciferol 50 MCG (2000 UT) CAPS  . DULoxetine  (CYMBALTA) 60 MG capsule  . losartan (COZAAR) 25 MG tablet  . nitroGLYCERIN (NITROSTAT) 0.4 MG SL tablet  . omeprazole (PRILOSEC) 40 MG capsule  . polyethylene glycol (MIRALAX / GLYCOLAX) packet  . simvastatin (ZOCOR) 20 MG tablet   No current facility-administered medications for this encounter.     Maia Plan [facility] Pre-Surgical Testing [phone] 03/12/19  4:22 PM

## 2019-03-14 ENCOUNTER — Other Ambulatory Visit: Payer: Self-pay

## 2019-03-14 ENCOUNTER — Inpatient Hospital Stay (HOSPITAL_COMMUNITY): Payer: Medicare HMO | Admitting: Certified Registered Nurse Anesthetist

## 2019-03-14 ENCOUNTER — Encounter (HOSPITAL_COMMUNITY)
Admission: RE | Disposition: A | Discharge status: Home / Self Care | Payer: Self-pay | Source: Home / Self Care | Attending: Orthopaedic Surgery

## 2019-03-14 ENCOUNTER — Inpatient Hospital Stay (HOSPITAL_COMMUNITY): Payer: Medicare HMO | Admitting: Physician Assistant

## 2019-03-14 ENCOUNTER — Encounter (HOSPITAL_COMMUNITY): Payer: Self-pay | Admitting: Certified Registered Nurse Anesthetist

## 2019-03-14 ENCOUNTER — Inpatient Hospital Stay (HOSPITAL_COMMUNITY)
Admission: RE | Admit: 2019-03-14 | Discharge: 2019-03-16 | DRG: 489 | Disposition: A | Discharge status: Home Health | Payer: Medicare HMO | Attending: Orthopaedic Surgery | Admitting: Orthopaedic Surgery

## 2019-03-14 DIAGNOSIS — Z1159 Encounter for screening for other viral diseases: Secondary | ICD-10-CM | POA: Diagnosis not present

## 2019-03-14 DIAGNOSIS — M170 Bilateral primary osteoarthritis of knee: Secondary | ICD-10-CM | POA: Diagnosis not present

## 2019-03-14 DIAGNOSIS — Z96652 Presence of left artificial knee joint: Secondary | ICD-10-CM

## 2019-03-14 DIAGNOSIS — Z803 Family history of malignant neoplasm of breast: Secondary | ICD-10-CM | POA: Diagnosis not present

## 2019-03-14 DIAGNOSIS — Z880 Allergy status to penicillin: Secondary | ICD-10-CM | POA: Diagnosis not present

## 2019-03-14 DIAGNOSIS — M65862 Other synovitis and tenosynovitis, left lower leg: Principal | ICD-10-CM | POA: Diagnosis present

## 2019-03-14 DIAGNOSIS — I200 Unstable angina: Secondary | ICD-10-CM | POA: Diagnosis not present

## 2019-03-14 DIAGNOSIS — Z888 Allergy status to other drugs, medicaments and biological substances status: Secondary | ICD-10-CM

## 2019-03-14 DIAGNOSIS — M659 Synovitis and tenosynovitis, unspecified: Secondary | ICD-10-CM

## 2019-03-14 DIAGNOSIS — T8454XA Infection and inflammatory reaction due to internal left knee prosthesis, initial encounter: Secondary | ICD-10-CM

## 2019-03-14 DIAGNOSIS — M1612 Unilateral primary osteoarthritis, left hip: Secondary | ICD-10-CM | POA: Diagnosis not present

## 2019-03-14 DIAGNOSIS — I209 Angina pectoris, unspecified: Secondary | ICD-10-CM | POA: Diagnosis present

## 2019-03-14 DIAGNOSIS — Z79899 Other long term (current) drug therapy: Secondary | ICD-10-CM | POA: Diagnosis not present

## 2019-03-14 DIAGNOSIS — Z8249 Family history of ischemic heart disease and other diseases of the circulatory system: Secondary | ICD-10-CM

## 2019-03-14 DIAGNOSIS — Z823 Family history of stroke: Secondary | ICD-10-CM

## 2019-03-14 DIAGNOSIS — Z96653 Presence of artificial knee joint, bilateral: Secondary | ICD-10-CM | POA: Diagnosis not present

## 2019-03-14 DIAGNOSIS — D649 Anemia, unspecified: Secondary | ICD-10-CM | POA: Diagnosis not present

## 2019-03-14 DIAGNOSIS — I10 Essential (primary) hypertension: Secondary | ICD-10-CM | POA: Diagnosis present

## 2019-03-14 HISTORY — PX: SYNOVECTOMY WITH POLY EXCHANGE: SHX6746

## 2019-03-14 SURGERY — SYNOVECTOMY WITH POLY EXCHANGE
Anesthesia: General | Site: Knee | Laterality: Left

## 2019-03-14 MED ORDER — VITAMIN D 25 MCG (1000 UNIT) PO TABS
2000.0000 [IU] | ORAL_TABLET | Freq: Every day | ORAL | Status: DC
  Administered 2019-03-15 – 2019-03-16 (×2): 2000 [IU] via ORAL
  Filled 2019-03-14 (×2): qty 2

## 2019-03-14 MED ORDER — LOSARTAN POTASSIUM 25 MG PO TABS
25.0000 mg | ORAL_TABLET | Freq: Every day | ORAL | Status: DC
  Administered 2019-03-14 – 2019-03-16 (×3): 25 mg via ORAL
  Filled 2019-03-14 (×3): qty 1

## 2019-03-14 MED ORDER — GABAPENTIN 100 MG PO CAPS
100.0000 mg | ORAL_CAPSULE | Freq: Three times a day (TID) | ORAL | Status: DC
  Administered 2019-03-14 – 2019-03-16 (×5): 100 mg via ORAL
  Filled 2019-03-14 (×5): qty 1

## 2019-03-14 MED ORDER — METHOCARBAMOL 500 MG PO TABS
500.0000 mg | ORAL_TABLET | Freq: Four times a day (QID) | ORAL | Status: DC | PRN
  Administered 2019-03-15 – 2019-03-16 (×3): 500 mg via ORAL
  Filled 2019-03-14 (×4): qty 1

## 2019-03-14 MED ORDER — PANTOPRAZOLE SODIUM 40 MG PO TBEC
40.0000 mg | DELAYED_RELEASE_TABLET | Freq: Every day | ORAL | Status: DC
  Administered 2019-03-14 – 2019-03-16 (×3): 40 mg via ORAL
  Filled 2019-03-14 (×3): qty 1

## 2019-03-14 MED ORDER — ROCURONIUM BROMIDE 10 MG/ML (PF) SYRINGE
PREFILLED_SYRINGE | INTRAVENOUS | Status: DC | PRN
  Administered 2019-03-14: 50 mg via INTRAVENOUS

## 2019-03-14 MED ORDER — SODIUM CHLORIDE 0.9 % IV SOLN
INTRAVENOUS | Status: DC
  Administered 2019-03-15: 02:00:00 via INTRAVENOUS

## 2019-03-14 MED ORDER — MENTHOL 3 MG MT LOZG: 1.0000 | LOZENGE | OROMUCOSAL | Status: DC | PRN

## 2019-03-14 MED ORDER — HYDROMORPHONE HCL 1 MG/ML IJ SOLN
INTRAMUSCULAR | Status: AC
  Filled 2019-03-14: qty 1

## 2019-03-14 MED ORDER — PHENOL 1.4 % MT LIQD
1.0000 | OROMUCOSAL | Status: DC | PRN
  Filled 2019-03-14: qty 177

## 2019-03-14 MED ORDER — LIDOCAINE 2% (20 MG/ML) 5 ML SYRINGE
INTRAMUSCULAR | Status: AC
  Filled 2019-03-14: qty 5

## 2019-03-14 MED ORDER — MIDAZOLAM HCL 2 MG/2ML IJ SOLN
1.0000 mg | INTRAMUSCULAR | Status: DC
  Filled 2019-03-14: qty 2

## 2019-03-14 MED ORDER — HYDROMORPHONE HCL 1 MG/ML IJ SOLN
INTRAMUSCULAR | Status: AC
  Administered 2019-03-14: 0.5 mg via INTRAVENOUS
  Filled 2019-03-14: qty 1

## 2019-03-14 MED ORDER — SIMVASTATIN 20 MG PO TABS
20.0000 mg | ORAL_TABLET | Freq: Every evening | ORAL | Status: DC
  Administered 2019-03-14 – 2019-03-15 (×2): 20 mg via ORAL
  Filled 2019-03-14 (×2): qty 1

## 2019-03-14 MED ORDER — ACETAMINOPHEN 325 MG PO TABS: 325.0000 mg | ORAL_TABLET | Freq: Four times a day (QID) | ORAL | Status: DC | PRN

## 2019-03-14 MED ORDER — ZOLPIDEM TARTRATE 5 MG PO TABS: 5.0000 mg | ORAL_TABLET | Freq: Every evening | ORAL | Status: DC | PRN

## 2019-03-14 MED ORDER — ONDANSETRON HCL 4 MG/2ML IJ SOLN
INTRAMUSCULAR | Status: DC | PRN
  Administered 2019-03-14: 4 mg via INTRAVENOUS

## 2019-03-14 MED ORDER — METHOCARBAMOL 500 MG IVPB - SIMPLE MED
500.0000 mg | Freq: Four times a day (QID) | INTRAVENOUS | Status: DC | PRN
  Administered 2019-03-14: 500 mg via INTRAVENOUS
  Filled 2019-03-14: qty 50

## 2019-03-14 MED ORDER — FENTANYL CITRATE (PF) 250 MCG/5ML IJ SOLN
INTRAMUSCULAR | Status: AC
  Filled 2019-03-14: qty 5

## 2019-03-14 MED ORDER — POVIDONE-IODINE 10 % EX SWAB
2.0000 "application " | Freq: Once | CUTANEOUS | Status: AC
  Administered 2019-03-14: 2 via TOPICAL

## 2019-03-14 MED ORDER — FENTANYL CITRATE (PF) 100 MCG/2ML IJ SOLN
INTRAMUSCULAR | Status: AC
  Filled 2019-03-14: qty 2

## 2019-03-14 MED ORDER — DEXAMETHASONE SODIUM PHOSPHATE 10 MG/ML IJ SOLN
INTRAMUSCULAR | Status: AC
  Filled 2019-03-14: qty 1

## 2019-03-14 MED ORDER — CLINDAMYCIN PHOSPHATE 600 MG/50ML IV SOLN
600.0000 mg | Freq: Four times a day (QID) | INTRAVENOUS | Status: AC
  Administered 2019-03-14 – 2019-03-15 (×2): 600 mg via INTRAVENOUS
  Filled 2019-03-14 (×2): qty 50

## 2019-03-14 MED ORDER — LACTATED RINGERS IV SOLN
INTRAVENOUS | Status: DC
  Administered 2019-03-14 (×2): via INTRAVENOUS

## 2019-03-14 MED ORDER — ASPIRIN 81 MG PO CHEW
81.0000 mg | CHEWABLE_TABLET | Freq: Two times a day (BID) | ORAL | Status: DC
  Administered 2019-03-14 – 2019-03-16 (×4): 81 mg via ORAL
  Filled 2019-03-14 (×4): qty 1

## 2019-03-14 MED ORDER — DOCUSATE SODIUM 100 MG PO CAPS
100.0000 mg | ORAL_CAPSULE | Freq: Two times a day (BID) | ORAL | Status: DC
  Administered 2019-03-14 – 2019-03-16 (×4): 100 mg via ORAL
  Filled 2019-03-14 (×4): qty 1

## 2019-03-14 MED ORDER — METOCLOPRAMIDE HCL 5 MG/ML IJ SOLN: 5.0000 mg | Freq: Three times a day (TID) | INTRAMUSCULAR | Status: DC | PRN

## 2019-03-14 MED ORDER — FENTANYL CITRATE (PF) 100 MCG/2ML IJ SOLN
INTRAMUSCULAR | Status: DC | PRN
  Administered 2019-03-14 (×2): 50 ug via INTRAVENOUS
  Administered 2019-03-14: 100 ug via INTRAVENOUS
  Administered 2019-03-14: 50 ug via INTRAVENOUS

## 2019-03-14 MED ORDER — ONDANSETRON HCL 4 MG/2ML IJ SOLN
INTRAMUSCULAR | Status: AC
  Filled 2019-03-14: qty 2

## 2019-03-14 MED ORDER — DIPHENHYDRAMINE HCL 12.5 MG/5ML PO ELIX: 12.5000 mg | ORAL_SOLUTION | ORAL | Status: DC | PRN

## 2019-03-14 MED ORDER — DULOXETINE HCL 60 MG PO CPEP
60.0000 mg | ORAL_CAPSULE | Freq: Two times a day (BID) | ORAL | Status: DC
  Administered 2019-03-14 – 2019-03-16 (×4): 60 mg via ORAL
  Filled 2019-03-14 (×4): qty 1

## 2019-03-14 MED ORDER — POLYETHYLENE GLYCOL 3350 17 G PO PACK: 17.0000 g | PACK | Freq: Every day | ORAL | Status: DC | PRN

## 2019-03-14 MED ORDER — VANCOMYCIN HCL 1000 MG IV SOLR
INTRAVENOUS | Status: DC | PRN
  Administered 2019-03-14: 1000 mg via INTRAVENOUS

## 2019-03-14 MED ORDER — PROPOFOL 10 MG/ML IV BOLUS
INTRAVENOUS | Status: DC | PRN
  Administered 2019-03-14: 150 mg via INTRAVENOUS

## 2019-03-14 MED ORDER — NITROGLYCERIN 0.4 MG SL SUBL: 0.4000 mg | SUBLINGUAL_TABLET | SUBLINGUAL | Status: DC | PRN

## 2019-03-14 MED ORDER — OXYCODONE HCL 5 MG PO TABS
5.0000 mg | ORAL_TABLET | ORAL | Status: DC | PRN
  Administered 2019-03-15: 10 mg via ORAL

## 2019-03-14 MED ORDER — VANCOMYCIN HCL IN DEXTROSE 1-5 GM/200ML-% IV SOLN
1000.0000 mg | Freq: Once | INTRAVENOUS | Status: DC
  Filled 2019-03-14: qty 200

## 2019-03-14 MED ORDER — ALUM & MAG HYDROXIDE-SIMETH 200-200-20 MG/5ML PO SUSP: 30.0000 mL | ORAL | Status: DC | PRN

## 2019-03-14 MED ORDER — DEXAMETHASONE SODIUM PHOSPHATE 10 MG/ML IJ SOLN
INTRAMUSCULAR | Status: DC | PRN
  Administered 2019-03-14: 8 mg via INTRAVENOUS

## 2019-03-14 MED ORDER — VANCOMYCIN HCL 1000 MG IV SOLR
INTRAVENOUS | Status: DC | PRN
  Administered 2019-03-14: 1000 mg via TOPICAL

## 2019-03-14 MED ORDER — VANCOMYCIN HCL 1000 MG IV SOLR
INTRAVENOUS | Status: AC
  Filled 2019-03-14: qty 1000

## 2019-03-14 MED ORDER — SODIUM CHLORIDE 0.9 % IR SOLN
Status: DC | PRN
  Administered 2019-03-14: 3000 mL

## 2019-03-14 MED ORDER — HYDROMORPHONE HCL 1 MG/ML IJ SOLN
0.2500 mg | INTRAMUSCULAR | Status: DC | PRN
  Administered 2019-03-14 (×4): 0.5 mg via INTRAVENOUS

## 2019-03-14 MED ORDER — ONDANSETRON HCL 4 MG/2ML IJ SOLN: 4.0000 mg | Freq: Four times a day (QID) | INTRAMUSCULAR | Status: DC | PRN

## 2019-03-14 MED ORDER — LIDOCAINE 2% (20 MG/ML) 5 ML SYRINGE
INTRAMUSCULAR | Status: DC | PRN
  Administered 2019-03-14: 50 mg via INTRAVENOUS

## 2019-03-14 MED ORDER — CHOLECALCIFEROL 50 MCG (2000 UT) PO CAPS: 2000.0000 [IU] | ORAL_CAPSULE | Freq: Every day | ORAL | Status: DC

## 2019-03-14 MED ORDER — PROPOFOL 10 MG/ML IV BOLUS
INTRAVENOUS | Status: AC
  Filled 2019-03-14: qty 20

## 2019-03-14 MED ORDER — SODIUM CHLORIDE 0.9 % IR SOLN
Status: DC | PRN
  Administered 2019-03-14: 1000 mL

## 2019-03-14 MED ORDER — HYDROMORPHONE HCL 1 MG/ML IJ SOLN
1.0000 mg | INTRAMUSCULAR | Status: DC | PRN
  Administered 2019-03-14: 1 mg via INTRAVENOUS

## 2019-03-14 MED ORDER — SUGAMMADEX SODIUM 200 MG/2ML IV SOLN
INTRAVENOUS | Status: DC | PRN
  Administered 2019-03-14: 140 mg via INTRAVENOUS

## 2019-03-14 MED ORDER — ONDANSETRON HCL 4 MG PO TABS: 4.0000 mg | ORAL_TABLET | Freq: Four times a day (QID) | ORAL | Status: DC | PRN

## 2019-03-14 MED ORDER — HYDROMORPHONE HCL 1 MG/ML IJ SOLN
0.5000 mg | INTRAMUSCULAR | Status: DC | PRN
  Administered 2019-03-14 – 2019-03-15 (×2): 1 mg via INTRAVENOUS
  Filled 2019-03-14: qty 1

## 2019-03-14 MED ORDER — METHOCARBAMOL 500 MG IVPB - SIMPLE MED
INTRAVENOUS | Status: AC
  Filled 2019-03-14: qty 50

## 2019-03-14 MED ORDER — FENTANYL CITRATE (PF) 100 MCG/2ML IJ SOLN
50.0000 ug | INTRAMUSCULAR | Status: DC
  Administered 2019-03-14: 100 ug via INTRAVENOUS
  Administered 2019-03-14 (×2): 50 ug via INTRAVENOUS
  Filled 2019-03-14: qty 2

## 2019-03-14 MED ORDER — OXYCODONE HCL 5 MG PO TABS
10.0000 mg | ORAL_TABLET | ORAL | Status: DC | PRN
  Administered 2019-03-15 – 2019-03-16 (×6): 15 mg via ORAL
  Filled 2019-03-14 (×5): qty 3
  Filled 2019-03-14: qty 2
  Filled 2019-03-14: qty 3

## 2019-03-14 MED ORDER — CHLORHEXIDINE GLUCONATE 4 % EX LIQD: 60.0000 mL | Freq: Once | CUTANEOUS | Status: DC

## 2019-03-14 MED ORDER — METOCLOPRAMIDE HCL 5 MG PO TABS: 5.0000 mg | ORAL_TABLET | Freq: Three times a day (TID) | ORAL | Status: DC | PRN

## 2019-03-14 SURGICAL SUPPLY — 36 items
ATTUNE PSRP INSR SZ6 8 KNEE (Insert) ×2 IMPLANT
BAG ZIPLOCK 12X15 (MISCELLANEOUS) ×2 IMPLANT
BANDAGE ACE 6X5 VEL STRL LF (GAUZE/BANDAGES/DRESSINGS) ×2 IMPLANT
COVER SURGICAL LIGHT HANDLE (MISCELLANEOUS) ×2 IMPLANT
COVER WAND RF STERILE (DRAPES) IMPLANT
CUFF TOURN SGL QUICK 34 (TOURNIQUET CUFF) ×1
CUFF TRNQT CYL 34X4.125X (TOURNIQUET CUFF) ×1 IMPLANT
DECANTER SPIKE VIAL GLASS SM (MISCELLANEOUS) IMPLANT
DRAPE U-SHAPE 47X51 STRL (DRAPES) ×2 IMPLANT
DRSG PAD ABDOMINAL 8X10 ST (GAUZE/BANDAGES/DRESSINGS) ×2 IMPLANT
ELECT REM PT RETURN 15FT ADLT (MISCELLANEOUS) ×2 IMPLANT
GAUZE SPONGE 4X4 12PLY STRL (GAUZE/BANDAGES/DRESSINGS) ×2 IMPLANT
GAUZE XEROFORM 5X9 LF (GAUZE/BANDAGES/DRESSINGS) ×2 IMPLANT
GLOVE BIO SURGEON STRL SZ7.5 (GLOVE) ×2 IMPLANT
GLOVE BIOGEL PI IND STRL 8 (GLOVE) ×2 IMPLANT
GLOVE BIOGEL PI INDICATOR 8 (GLOVE) ×2
GLOVE ECLIPSE 8.0 STRL XLNG CF (GLOVE) ×2 IMPLANT
GOWN STRL REUS W/TWL XL LVL3 (GOWN DISPOSABLE) ×4 IMPLANT
HANDPIECE INTERPULSE COAX TIP (DISPOSABLE) ×1
IMMOBILIZER KNEE 20 (SOFTGOODS)
IMMOBILIZER KNEE 20 THIGH 36 (SOFTGOODS) IMPLANT
KIT STIMULAN RAPID CURE  10CC (Orthopedic Implant) ×1 IMPLANT
KIT STIMULAN RAPID CURE 10CC (Orthopedic Implant) ×1 IMPLANT
KIT TURNOVER KIT A (KITS) IMPLANT
PACK TOTAL KNEE CUSTOM (KITS) ×2 IMPLANT
PADDING CAST COTTON 6X4 STRL (CAST SUPPLIES) ×2 IMPLANT
PROTECTOR NERVE ULNAR (MISCELLANEOUS) ×2 IMPLANT
SET HNDPC FAN SPRY TIP SCT (DISPOSABLE) ×1 IMPLANT
SUT ETHIBOND NAB CT1 #1 30IN (SUTURE) ×2 IMPLANT
SUT MNCRL AB 4-0 PS2 18 (SUTURE) ×2 IMPLANT
SUT VIC AB 1 CT1 36 (SUTURE) ×6 IMPLANT
SUT VIC AB 2-0 CT1 27 (SUTURE) ×3
SUT VIC AB 2-0 CT1 TAPERPNT 27 (SUTURE) ×3 IMPLANT
SWAB COLLECTION DEVICE MRSA (MISCELLANEOUS) ×2 IMPLANT
SWAB CULTURE ESWAB REG 1ML (MISCELLANEOUS) ×2 IMPLANT
WRAP KNEE MAXI GEL POST OP (GAUZE/BANDAGES/DRESSINGS) ×2 IMPLANT
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
## 2019-03-14 NOTE — Anesthesia Procedure Notes (Signed)
Procedure Name: Intubation Date/Time: 03/14/2019 2:47 PM Performed by: Anne Fu, CRNA Pre-anesthesia Checklist: Patient identified, Emergency Drugs available, Suction available, Patient being monitored and Timeout performed Patient Re-evaluated:Patient Re-evaluated prior to induction Oxygen Delivery Method: Circle system utilized Preoxygenation: Pre-oxygenation with 100% oxygen Induction Type: IV induction Ventilation: Mask ventilation without difficulty Laryngoscope Size: Mac and 4 Grade View: Grade II Tube type: Oral Tube size: 7.5 mm Number of attempts: 1 Airway Equipment and Method: Stylet Placement Confirmation: ETT inserted through vocal cords under direct vision,  positive ETCO2 and breath sounds checked- equal and bilateral Secured at: 21 cm Tube secured with: Tape Dental Injury: Teeth and Oropharynx as per pre-operative assessment  Comments: 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

## 2019-03-14 NOTE — Anesthesia Postprocedure Evaluation (Signed)
Anesthesia Post Note  Patient: DIMONIQUE BOURDEAU  Procedure(s) Performed: IRRIGATION AND DEBRIDEMENT, OPEN SYNOVECTOMY LEFT KNEE WITH POLY EXCHANGE, PLACEMENT OF STIMULAN BEADS (Left Knee)     Patient location during evaluation: PACU Anesthesia Type: General Level of consciousness: awake Pain management: pain level controlled Vital Signs Assessment: post-procedure vital signs reviewed and stable Respiratory status: spontaneous breathing Cardiovascular status: stable Postop Assessment: no apparent nausea or vomiting Anesthetic complications: no    Last Vitals:  Vitals:   03/14/19 1715 03/14/19 1815  BP:    Pulse:    Resp: 19   Temp:  36.7 C  SpO2:  96%    Last Pain:  Vitals:   03/14/19 1815  TempSrc:   PainSc: 10-Worst pain ever                 Thurlow Gallaga

## 2019-03-14 NOTE — Brief Op Note (Signed)
03/14/2019  4:08 PM  PATIENT:  Starleen Blue  68 y.o. female  PRE-OPERATIVE DIAGNOSIS:  Synovitis left total knee  POST-OPERATIVE DIAGNOSIS:  Synovitis left total knee  PROCEDURE:  Procedure(s): IRRIGATION AND DEBRIDEMENT, OPEN SYNOVECTOMY LEFT KNEE WITH POLY EXCHANGE, PLACEMENT OF STIMULAN BEADS (Left)  SURGEON:  Surgeon(s) and Role:    * Mcarthur Rossetti, MD - Primary  PHYSICIAN ASSISTANT: Benita Stabile, PA-C  ANESTHESIA:   general  EBL:  100 mL   COUNTS:  YES  TOURNIQUET:   Total Tourniquet Time Documented: Thigh (Left) - 35 minutes Total: Thigh (Left) - 35 minutes   DICTATION: .Other Dictation: Dictation Number [id number]  PLAN OF CARE: Admit to inpatient   PATIENT DISPOSITION:  PACU - hemodynamically stable.   Delay start of Pharmacological VTE agent (>24hrs) due to surgical blood loss or risk of bleeding: no

## 2019-03-14 NOTE — Transfer of Care (Signed)
Immediate Anesthesia Transfer of Care Note  Patient: [name]  Procedure(s) Performed: Procedure(s): IRRIGATION AND DEBRIDEMENT, OPEN SYNOVECTOMY LEFT KNEE WITH POLY EXCHANGE, PLACEMENT OF STIMULAN BEADS (Left)  Patient Location: PACU  Anesthesia Type:General  Level of Consciousness:  sedated, patient cooperative and responds to stimulation  Airway & Oxygen Therapy:Patient Spontanous Breathing and Patient connected to face mask oxgen  Post-op Assessment:  Report given to PACU RN and Post -op Vital signs reviewed and stable  Post vital signs:  Reviewed and stable  Last Vitals:  Vitals:   03/14/19 1209  BP: 122/75  Pulse: 92  Resp: 18  Temp: 37.5 C  SpO2: 542%    Complications: No apparent anesthesia complications
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
## 2019-03-15 MED ORDER — ASPIRIN 81 MG PO CHEW: 81.0000 mg | CHEWABLE_TABLET | Freq: Two times a day (BID) | ORAL | 0 refills | Status: DC

## 2019-03-15 MED ORDER — METHOCARBAMOL 500 MG PO TABS: 500.0000 mg | ORAL_TABLET | Freq: Four times a day (QID) | ORAL | 1 refills | Status: DC | PRN

## 2019-03-15 MED ORDER — OXYCODONE HCL 5 MG PO TABS: 5.0000 mg | ORAL_TABLET | Freq: Four times a day (QID) | ORAL | 0 refills | Status: DC | PRN

## 2019-03-15 NOTE — Progress Notes (Signed)
Physical Therapy Treatment Patient Details Name: [name] MRN: [id number] DOB: 1951-05-08 Today's Date: 03/15/2019    History of Present Illness s/p I and D, L knee arthrotomy with poly exchange, placement of abx beads; Hx: bil TKAs    PT Comments    Pt progressing with mobility; gentle soft tissue massage over L IT band, light traction L LE to assist with decr pain and decr muscle guarding. Lengthy discussion with pt regarding progression and PT options at d/c. Will continue to follow in acute setting   Follow Up Recommendations  Follow surgeon's recommendation for DC plan and follow-up therapies(benefit from OPPT)     Equipment Recommendations  None recommended by PT    Recommendations for Other Services       Precautions / Restrictions Precautions Precautions: Knee Restrictions Weight Bearing Restrictions: No Other Position/Activity Restrictions: WBAT    Mobility  Bed Mobility Overal bed mobility: Needs Assistance Bed Mobility: Supine to Sit     Supine to sit: Min guard     General bed mobility comments: pt in recliner  Transfers Overall transfer level: Needs assistance Equipment used: Rolling walker (2 wheeled) Transfers: Sit to/from Stand Sit to Stand: Min guard         General transfer comment: for safety  Ambulation/Gait Ambulation/Gait assistance: Min guard Gait Distance (Feet): 170 Feet Assistive device: Rolling walker (2 wheeled) Gait Pattern/deviations: Step-to pattern;Step-through pattern;Decreased weight shift to left Gait velocity: decr   General Gait Details: min/guard for safety--pt unsteady d/t pain meds, cues for knee flexion during swing phase, heel strike on L.    Stairs             Wheelchair Mobility    Modified Rankin (Stroke Patients Only)       Balance                                            Cognition Arousal/Alertness: Awake/alert Behavior During Therapy: WFL for tasks  assessed/performed Overall Cognitive Status: Within Functional Limits for tasks assessed                                        Exercises Total Joint Exercises Ankle Circles/Pumps: AROM;Both;10 reps Quad Sets: 10 reps;Both;AROM Knee Flexion: AAROM;Left;5 reps;Seated Goniometric ROM: grossly 20* to 65* L knee flexion aarom    General Comments        Pertinent Vitals/Pain Pain Assessment: 0-10 Pain Score: 6  Pain Location: L knee Pain Descriptors / Indicators: Grimacing;Guarding;Sore Pain Intervention(s): Limited activity within patient's tolerance;Monitored during session;Premedicated before session;Ice applied    Home Living                      Prior Function            PT Goals (current goals can now be found in the care plan section) Acute Rehab PT Goals Patient Stated Goal: to play tennis in 6 weeks PT Goal Formulation: With patient Time For Goal Achievement: 03/22/19 Potential to Achieve Goals: Good Progress towards PT goals: Progressing toward goals    Frequency    7X/week      PT Plan Current plan remains appropriate    Co-evaluation              AM-PAC PT "6  Clicks" Mobility   Outcome Measure  Help needed turning from your back to your side while in a flat bed without using bedrails?: A Little Help needed moving from lying on your back to sitting on the side of a flat bed without using bedrails?: A Little Help needed moving to and from a bed to a chair (including a wheelchair)?: A Little Help needed standing up from a chair using your arms (e.g., wheelchair or bedside chair)?: A Little Help needed to walk in hospital room?: A Little Help needed climbing 3-5 steps with a railing? : A Little 6 Click Score: 18    End of Session Equipment Utilized During Treatment: Gait belt Activity Tolerance: Patient tolerated treatment well Patient left: in chair;with call bell/phone within reach   PT Visit Diagnosis: Difficulty in  walking, not elsewhere classified (R26.2)     Time: 1425-1500 PT Time Calculation (min) (ACUTE ONLY): 35 min  Charges:  $Gait Training: 8-22 mins $Therapeutic Exercise: 8-22 mins                     Kenyon Ana, PT  Pager: [phone] Acute Rehab Dept [facility]): [phone]   03/15/2019    [facility] 03/15/2019, 3:10 PM

## 2019-03-15 NOTE — Evaluation (Signed)
Physical Therapy Evaluation Patient Details Name: [name] MRN: [id number] DOB: [birth year] Today's Date: 03/15/2019   History of Present Illness  s/p I and D, L knee arthrotomy with poly exchange, placement of abx beads; Hx: bil TKAs  Clinical Impression  Pt admitted with above diagnosis. Pt currently with functional limitations due to the deficits listed below (see PT Problem List).  Pt is very motivated, discussed progression and not overdoing it which pt admittedly has a hx of doing. Will continue to follow in acute setting.  Pt will benefit from skilled PT to increase their independence and safety with mobility to allow discharge to the venue listed below.       Follow Up Recommendations Follow surgeon's recommendation for DC plan and follow-up therapies    Equipment Recommendations  None recommended by PT    Recommendations for Other Services       Precautions / Restrictions Precautions Precautions: Knee Restrictions Weight Bearing Restrictions: No Other Position/Activity Restrictions: WBAT      Mobility  Bed Mobility Overal bed mobility: Needs Assistance Bed Mobility: Supine to Sit     Supine to sit: Min guard     General bed mobility comments: for safety  Transfers Overall transfer level: Needs assistance Equipment used: Rolling walker (2 wheeled) Transfers: Sit to/from Stand Sit to Stand: Min guard         General transfer comment: for safety  Ambulation/Gait Ambulation/Gait assistance: Min guard Gait Distance (Feet): 160 Feet Assistive device: Rolling walker (2 wheeled) Gait Pattern/deviations: Step-to pattern;Step-through pattern;Decreased weight shift to left Gait velocity: decr   General Gait Details: min/guard for safety  Stairs            Wheelchair Mobility    Modified Rankin (Stroke Patients Only)       Balance                                             Pertinent Vitals/Pain Pain Assessment:  0-10 Pain Score: 4  Pain Location: L knee Pain Descriptors / Indicators: Grimacing;Guarding;Sore Pain Intervention(s): Monitored during session;Limited activity within patient's tolerance;Premedicated before session    Tripp expects to be discharged to:: Private residence   Available Help at Discharge: Available PRN/intermittently Type of Home: House Home Access: Stairs to enter     Home Layout: Multi-level;1/2 bath on main level;Bed/bath upstairs Home Equipment: Walker - 2 wheels;Bedside commode      Prior Function Level of Independence: Independent         Comments: pt may d/c home with cousin who is an OT and has a one level home     Hand Dominance        Extremity/Trunk Assessment   Upper Extremity Assessment Upper Extremity Assessment: Overall WFL for tasks assessed    Lower Extremity Assessment Lower Extremity Assessment: LLE deficits/detail LLE Deficits / Details: ankle WFL, knee extension and hip flexion 3/5; knee AAROM grossly 15* to 45* AAROM flexion       Communication   Communication: No difficulties  Cognition Arousal/Alertness: Awake/alert Behavior During Therapy: WFL for tasks assessed/performed Overall Cognitive Status: Within Functional Limits for tasks assessed                                        General Comments  Exercises Total Joint Exercises Ankle Circles/Pumps: AROM;Both;10 reps Quad Sets: 10 reps;Both;AROM   Assessment/Plan    PT Assessment Patient needs continued PT services  PT Problem List Decreased strength;Decreased mobility;Decreased activity tolerance;Decreased knowledge of use of DME;Pain;Decreased range of motion       PT Treatment Interventions DME instruction;Gait training;Functional mobility training;Patient/family education;Therapeutic exercise;Stair training    PT Goals (Current goals can be found in the Care Plan section)  Acute Rehab PT Goals Patient Stated Goal:  to play tennis in 6 weeks PT Goal Formulation: With patient Time For Goal Achievement: 03/22/19 Potential to Achieve Goals: Good    Frequency 7X/week   Barriers to discharge        Co-evaluation               AM-PAC PT "6 Clicks" Mobility  Outcome Measure Help needed turning from your back to your side while in a flat bed without using bedrails?: A Little Help needed moving from lying on your back to sitting on the side of a flat bed without using bedrails?: A Little Help needed moving to and from a bed to a chair (including a wheelchair)?: A Little Help needed standing up from a chair using your arms (e.g., wheelchair or bedside chair)?: A Little Help needed to walk in hospital room?: A Little Help needed climbing 3-5 steps with a railing? : A Little 6 Click Score: 18    End of Session Equipment Utilized During Treatment: Gait belt Activity Tolerance: Patient tolerated treatment well Patient left: in chair;with call bell/phone within reach   PT Visit Diagnosis: Difficulty in walking, not elsewhere classified (R26.2)    Time: 1219-7588 PT Time Calculation (min) (ACUTE ONLY): 36 min   Charges:   PT Evaluation $PT Eval Low Complexity: 1 Low PT Treatments $Gait Training: 8-22 mins       Kenyon Ana, PT  Pager: [phone] Acute Rehab Dept [facility]): [phone]   03/15/2019   [facility] 03/15/2019, 11:42 AM

## 2019-03-15 NOTE — Progress Notes (Signed)
Subjective: 1 Day Post-Op Procedure(s) (LRB): IRRIGATION AND DEBRIDEMENT, OPEN SYNOVECTOMY LEFT KNEE WITH POLY EXCHANGE, PLACEMENT OF STIMULAN BEADS (Left) Patient reports pain as moderate.  Otherwise, feeling ok.   Objective: Vital signs in last 24 hours: Temp:  [97.7 F (36.5 C)-99.5 F (37.5 C)] 98.1 F (36.7 C) (07/11 0603) Pulse Rate:  [71-98] 72 (07/11 0603) Resp:  [12-20] 16 (07/11 0603) BP: (103-128)/(59-91) 109/65 (07/11 0603) SpO2:  [96 %-100 %] 100 % (07/11 0603) Weight:  [67.1 kg] 67.1 kg (07/10 1300)  Intake/Output from previous day: 07/10 0701 - 07/11 0700 In: 2708.2 [P.O.:600; I.V.:1958.2; IV Piggyback:150] Out: 800 [Urine:700; Blood:100] Intake/Output this shift: No intake/output data recorded.  No results for input(s): HGB in the last 72 hours. No results for input(s): WBC, RBC, HCT, PLT in the last 72 hours. No results for input(s): NA, K, CL, CO2, BUN, CREATININE, GLUCOSE, CALCIUM in the last 72 hours. No results for input(s): LABPT, INR in the last 72 hours.  Neurovascular intact Sensation intact distally Intact pulses distally Dorsiflexion/Plantar flexion intact Incision: scant drainage No cellulitis present Compartment soft   Assessment/Plan: 1 Day Post-Op Procedure(s) (LRB): IRRIGATION AND DEBRIDEMENT, OPEN SYNOVECTOMY LEFT KNEE WITH POLY EXCHANGE, PLACEMENT OF STIMULAN BEADS (Left) Advance diet Up with therapy D/C IV fluids  WBAT LLE Intra-op cx no growth to date Bandage changed by me today Please apply thigh high ted hose to BLE Will plan to stay through weekend to ensure there is no cx growth    Anticipated LOS equal to or greater than 2 midnights due to - Age 68 and older with one or more of the following:  - Obesity  - Expected need for hospital services (PT, OT, Nursing) required for safe  discharge  - Anticipated need for postoperative skilled nursing care or inpatient rehab  - Active co-morbidities: None OR   - Unanticipated  findings during/Post Surgery: Slow post-op progression: GI, pain control, mobility  - Patient is a high risk of re-admission due to: None    Aundra Dubin 03/15/2019, 8:02 AM

## 2019-03-16 NOTE — Progress Notes (Signed)
    Home health agencies that serve [phone].        Home Health Agencies Search Results  Results List Table  Home Health Agency Information Quality of Patient Care Rating Patient Survey Summary Rating  [facility] [phone] 4 out of 5 stars 4 out of 5 stars  [facility] [phone] 4  out of 5 stars 3 out of 5 stars  [facility] [phone] 4 out of 5 stars 4 out of 5 stars  [facility] [phone] 4 out of 5 stars 4 out of 5 stars  [facility] [phone] 3  out of 5 stars 4 out of 5 stars  [facility] [phone] 3 out of 5 stars 4 out of 5 stars  [facility] [phone] 4 out of 5 stars Not Available12  [facility] [phone] 3  out of 5 stars 3 out of 5 stars  [facility] [phone] 3  out of 5 stars 4 out of 5 stars  [facility] [phone] 3  out of 5 stars 3 out of 5 stars  [facility] [phone] 4  out of 5 stars 3 out of 5 stars  [facility] [phone] 4  out of 5 stars 2 out of 5 stars   Home Health Footnotes  Footnote number Footnote as displayed on Home Health Compare  1 This agency provides services under a federal waiver program to non-traditional, chronic long term population.  2 This agency provides services to a special needs population.  3 Not Available.  4 The number of patient episodes for this measure is too small to report.  5 This measure currently does not have data or provider has been certified/recertified for less than 6 months.  6 The national average for this measure is not provided because of state-to-state differences in data collection.  7 Medicare is not displaying rates for this measure for any home health agency, because of an issue with the data.  8 There were problems with the data and they are being corrected.  9 Zero, or very few,  patients met the survey's rules for inclusion. The scores shown, if any, reflect a very small number of surveys and may not accurately tell how an agency is doing.  10 Survey results are based on less than 12 months of data.  11 Fewer than 70 patients completed the survey. Use the scores shown, if any, with caution as the number of surveys may be too low to accurately tell how an agency is doing.  12 No survey results are available for this period.  13 Data suppressed by CMS for one or more quarters.    

## 2019-03-16 NOTE — Progress Notes (Signed)
Discharge instructions reviewed with patient utilizing teach back method. Patient waiting DME.

## 2019-03-16 NOTE — Discharge Instructions (Signed)

## 2019-03-16 NOTE — Progress Notes (Signed)
Subjective: 2 Days Post-Op Procedure(s) (LRB): IRRIGATION AND DEBRIDEMENT, OPEN SYNOVECTOMY LEFT KNEE WITH POLY EXCHANGE, PLACEMENT OF STIMULAN BEADS (Left) Patient reports pain as mild.    Objective: Vital signs in last 24 hours: Temp:  [97.7 F (36.5 C)-98.7 F (37.1 C)] 98.7 F (37.1 C) (07/12 0510) Pulse Rate:  [78-91] 91 (07/12 0510) Resp:  [15-16] 16 (07/12 0510) BP: (92-106)/(51-78) 102/59 (07/12 0510) SpO2:  [95 %-100 %] 96 % (07/12 0510)  Intake/Output from previous day: 07/11 0701 - 07/12 0700 In: 580 [P.O.:580] Out: 650 [Urine:650] Intake/Output this shift: Total I/O In: 100 [P.O.:100] Out: 100 [Urine:100]  No results for input(s): HGB in the last 72 hours. No results for input(s): WBC, RBC, HCT, PLT in the last 72 hours. No results for input(s): NA, K, CL, CO2, BUN, CREATININE, GLUCOSE, CALCIUM in the last 72 hours. No results for input(s): LABPT, INR in the last 72 hours.  Neurologically intact Neurovascular intact Sensation intact distally Intact pulses distally Dorsiflexion/Plantar flexion intact Incision: dressing C/D/I No cellulitis present Compartment soft   Assessment/Plan: 2 Days Post-Op Procedure(s) (LRB): IRRIGATION AND DEBRIDEMENT, OPEN SYNOVECTOMY LEFT KNEE WITH POLY EXCHANGE, PLACEMENT OF STIMULAN BEADS (Left) Advance diet Up with therapy D/C IV fluids Discharge home with home health as long as she continues to make progress with PT WBAT LLE Intra-op cx no growth to date     Anticipated LOS equal to or greater than 2 midnights due to - Age 67 and older with one or more of the following:  - Obesity  - Expected need for hospital services (PT, OT, Nursing) required for safe  discharge  - Anticipated need for postoperative skilled nursing care or inpatient rehab  - Active co-morbidities: None OR   - Unanticipated findings during/Post Surgery: Slow post-op progression: GI, pain control, mobility  - Patient is a high risk of re-admission  due to: None    Aundra Dubin 03/16/2019, 6:36 AM

## 2019-03-16 NOTE — TOC Initial Note (Signed)
Transition of Care [facility]) - Initial/Assessment Note    Patient Details  Name: [name] MRN: [id number] Date of Birth: October 03, 1950  [facility] CM/SW Contact:    Joaquin Courts, RN Phone Number: 03/16/2019, 1:43 PM  Clinical Narrative:                  CM spoke with patient at bedside. Patient set up with [facility] for [place]. [employer] to deliver rolling walker and 3-in-1 to bedside for home use.  Expected Discharge Plan: [place] Barriers to Discharge: No Barriers Identified   Patient Goals and CMS Choice Patient states their goals for this hospitalization and ongoing recovery are:: to go home CMS Medicare.gov Compare Post Acute Care list provided to:: Patient Choice offered to / list presented to : Patient  Expected Discharge Plan and Services Expected Discharge Plan: [place]   Discharge Planning Services: CM Consult   Living arrangements for the past 2 months: Single Family Home Expected Discharge Date: 03/16/19               DME Arranged: Berta Minor rolling DME Agency: [employer] Date DME Agency Contacted: 03/16/19 Time DME Agency Contacted: 1000 Representative spoke with at DME Agency: [place]: PT [place]: [facility] [place] Date [place]: 03/16/19 Time [place]: 1343 Representative spoke with at [place]: Glyn Ade  Prior Living Arrangements/Services Living arrangements for the past 2 months: Single Family Home Lives with:: Spouse Patient language and need for interpreter reviewed:: Yes Do you feel safe going back to the place where you live?: Yes      Need for Family Participation in Patient Care: Yes (Comment) Care giver support system in place?: Yes (comment)   Criminal Activity/Legal Involvement Pertinent to Current Situation/Hospitalization: No - Comment as needed  Activities of Daily Living      Permission Sought/Granted                   Emotional Assessment Appearance:: Appears stated age Attitude/Demeanor/Rapport: Engaged Affect (typically observed): Accepting Orientation: : Oriented to Place, Oriented to  Time, Oriented to Situation, Oriented to Self   Psych Involvement: No (comment)  Admission diagnosis:  Synovitis left total knee Patient Active Problem List   Diagnosis Date Noted  . Synovitis of left knee 03/14/2019  . Status post revision of total replacement of left knee 03/14/2019  . Essential hypertension 09/03/2017  . Unstable angina ([place]) 08/31/2017  . 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 Constipation due to pain medication   . Post-operative pain   . Abnormality of gait   . Acute blood loss anemia 07/06/2016  . 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

## 2019-03-16 NOTE — Progress Notes (Signed)
Physical Therapy Treatment Patient Details Name: [name] MRN: [id number] DOB: 04/26/1951 Today's Date: 03/16/2019    History of Present Illness s/p I and D, L knee arthrotomy with poly exchange, placement of abx beads; Hx: bil TKAs    PT Comments    Pt progressing well with mobility. Remains concerned about swelling, pain, progression. Lengthy discussion  Regarding these issues, as well as normal post op swelling and not over doing it.  Follow Up Recommendations  Follow surgeon's recommendation for DC plan and follow-up therapies(OPPT)     Equipment Recommendations  None recommended by PT    Recommendations for Other Services       Precautions / Restrictions Precautions Precautions: Knee Restrictions Weight Bearing Restrictions: No Other Position/Activity Restrictions: WBAT    Mobility  Bed Mobility Overal bed mobility: Needs Assistance Bed Mobility: Supine to Sit     Supine to sit: Modified independent (Device/Increase time)        Transfers Overall transfer level: Needs assistance Equipment used: Rolling walker (2 wheeled) Transfers: Sit to/from Stand Sit to Stand: Supervision         General transfer comment: for safety  Ambulation/Gait Ambulation/Gait assistance: Supervision;Modified independent (Device/Increase time) Gait Distance (Feet): 300 Feet Assistive device: Rolling walker (2 wheeled) Gait Pattern/deviations: Step-to pattern;Step-through pattern;Decreased weight shift to left     General Gait Details: cues for gait progression, improving fluidity of gait, equeal wt shift   Stairs Stairs: Yes Stairs assistance: Min guard;Supervision Stair Management: Step to pattern;One rail Right;Two rails;Forwards Number of Stairs: 14 General stair comments: cues for  sequence and safety   Wheelchair Mobility    Modified Rankin (Stroke Patients Only)       Balance                                             Cognition Arousal/Alertness: Awake/alert Behavior During Therapy: WFL for tasks assessed/performed Overall Cognitive Status: Within Functional Limits for tasks assessed                                        Exercises Total Joint Exercises Ankle Circles/Pumps: AROM;Both;10 reps Quad Sets: 10 reps;Both;AROM Straight Leg Raises: AROM;AAROM;Left;15 reps Goniometric ROM: grossly 14 to 65 degrees L knee flexion aarom    General Comments        Pertinent Vitals/Pain Pain Assessment: 0-10 Pain Score: 5  Pain Location: L knee Pain Descriptors / Indicators: Grimacing;Guarding;Sore Pain Intervention(s): Limited activity within patient's tolerance;Monitored during session;Ice applied;Premedicated before session    Home Living                      Prior Function            PT Goals (current goals can now be found in the care plan section) Acute Rehab PT Goals Patient Stated Goal: to play tennis in 6 weeks PT Goal Formulation: With patient Time For Goal Achievement: 03/22/19 Potential to Achieve Goals: Good Progress towards PT goals: Progressing toward goals    Frequency    7X/week      PT Plan Current plan remains appropriate    Co-evaluation              AM-PAC PT "6 Clicks" Mobility   Outcome Measure  Help needed  turning from your back to your side while in a flat bed without using bedrails?: None Help needed moving from lying on your back to sitting on the side of a flat bed without using bedrails?: None Help needed moving to and from a bed to a chair (including a wheelchair)?: None Help needed standing up from a chair using your arms (e.g., wheelchair or bedside chair)?: None Help needed to walk in hospital room?: None Help needed climbing 3-5 steps with a railing? : A Little 6 Click Score: 23    End of Session Equipment Utilized During Treatment: Gait belt Activity Tolerance: Patient tolerated treatment well Patient left: with  call bell/phone within reach;in bed   PT Visit Diagnosis: Difficulty in walking, not elsewhere classified (R26.2)     Time: 1000-1048 PT Time Calculation (min) (ACUTE ONLY): 48 min  Charges:  $Gait Training: 23-37 mins $Therapeutic Exercise: 8-22 mins                     Kenyon Ana, PT  Pager: [phone] Acute Rehab Dept [facility]): [phone]   03/16/2019    [facility] 03/16/2019, 11:10 AM

## 2019-03-16 NOTE — Discharge Summary (Signed)
Patient ID: [name] MRN: [id number] DOB/AGE: 01-26-1951 68 y.o.  Admit date: 03/14/2019 Discharge date: 03/16/2019  Admission Diagnoses:  Principal Problem:   Synovitis of left knee Active Problems:   Status post revision of total replacement of left knee   Discharge Diagnoses:  Same  Past Medical History:  Diagnosis Date  . Arthritis    osteoarthritis knees mostly  . Chest pain    a. 08/2017 MV: mid-basilar anteroseptal ischemia;  b. 08/2017 Cath: LM nl, LAD 25m, D2 30, RI nl, LCX 20ost, RCA large, nl, EF 65%.  . Diastolic dysfunction    a. 08/2017 Echo: EF 60-65%, no rwma, Gr1 DD.  Marland Kitchen Hypokalemia    takes losartan for this    Surgeries: Procedure(s): IRRIGATION AND DEBRIDEMENT, OPEN SYNOVECTOMY LEFT KNEE WITH POLY EXCHANGE, PLACEMENT OF STIMULAN BEADS on 03/14/2019   Consultants:   Discharged Condition: Improved  Hospital Course: [name] is an 68 y.o. female who was admitted 03/14/2019 for operative treatment ofSynovitis of left knee. Patient has severe unremitting pain that affects sleep, daily activities, and work/hobbies. After pre-op clearance the patient was taken to the operating room on 03/14/2019 and underwent  Procedure(s): IRRIGATION AND DEBRIDEMENT, OPEN SYNOVECTOMY LEFT KNEE WITH POLY EXCHANGE, PLACEMENT OF STIMULAN BEADS.    Patient was given perioperative antibiotics:  Anti-infectives (From admission, onward)   Start     Dose/Rate Route Frequency Ordered Stop   03/14/19 2000  clindamycin (CLEOCIN) IVPB 600 mg     600 mg 100 mL/hr over 30 Minutes Intravenous Every 6 hours 03/14/19 1835 03/15/19 0221   03/14/19 1539  vancomycin (VANCOCIN) powder  Status:  Discontinued       As needed 03/14/19 1540 03/14/19 1628   03/14/19 1300  vancomycin (VANCOCIN) IVPB 1000 mg/200 mL premix  Status:  Discontinued    Note to Pharmacy: Post culture   1,000 mg 200 mL/hr over 60 Minutes Intravenous  Once 03/14/19 1249 03/14/19 1835       Patient was given  sequential compression devices, early ambulation, and chemoprophylaxis to prevent DVT.  Patient benefited maximally from hospital stay and there were no complications.    Recent vital signs:  Patient Vitals for the past 24 hrs:  BP Temp Temp src Pulse Resp SpO2  03/16/19 0510 (!) 102/59 98.7 F (37.1 C) Oral 91 16 96 %  03/15/19 2014 106/63 98 F (36.7 C) - 85 16 99 %  03/15/19 1752 (!) 95/56 97.7 F (36.5 C) - 78 16 95 %  03/15/19 1349 99/78 97.9 F (36.6 C) Oral 84 15 97 %  03/15/19 0936 (!) 92/51 97.8 F (36.6 C) - 80 16 100 %     Recent laboratory studies: No results for input(s): WBC, HGB, HCT, PLT, NA, K, CL, CO2, BUN, CREATININE, GLUCOSE, INR, CALCIUM in the last 72 hours.  Invalid input(s): PT, 2   Discharge Medications:   Allergies as of 03/16/2019      Reactions   Penicillins Anaphylaxis, Swelling   Has patient had a PCN reaction causing immediate rash, facial/tongue/throat swelling, SOB or lightheadedness with hypotension:Yes Has patient had a PCN reaction causing severe rash involving mucus membranes or skin necrosis:No Has patient had a PCN reaction that required hospitalization:No Has patient had a PCN reaction occurring within the last 10 years:No If all of the above answers are "NO", then may proceed with Cephalosporin use.   Tetracyclines & Related Hives      Medication List    STOP taking these medications  aspirin-acetaminophen-caffeine 250-250-65 MG tablet Commonly known as: EXCEDRIN MIGRAINE     TAKE these medications   aspirin 81 MG chewable tablet Chew 1 tablet (81 mg total) by mouth 2 (two) times daily.   Cholecalciferol 50 MCG (2000 UT) Caps Take 2,000 Units by mouth daily.   DULoxetine 60 MG capsule Commonly known as: CYMBALTA Take 60 mg by mouth 2 (two) times daily.   losartan 25 MG tablet Commonly known as: COZAAR Take 1 tablet (25 mg total) by mouth daily.   methocarbamol 500 MG tablet Commonly known as: ROBAXIN Take 1 tablet  (500 mg total) by mouth every 6 (six) hours as needed for muscle spasms.   nitroGLYCERIN 0.4 MG SL tablet Commonly known as: NITROSTAT Place 1 tablet (0.4 mg total) under the tongue every 5 (five) minutes x 3 doses as needed for chest pain.   omeprazole 40 MG capsule Commonly known as: PRILOSEC Take 20 mg by mouth daily.   oxyCODONE 5 MG immediate release tablet Commonly known as: Oxy IR/ROXICODONE Take 1-2 tablets (5-10 mg total) by mouth every 6 (six) hours as needed for moderate pain (pain score 4-6).   polyethylene glycol 17 g packet Commonly known as: MIRALAX / GLYCOLAX Take 17 g by mouth 2 (two) times daily. What changed:   when to take this  reasons to take this   simvastatin 20 MG tablet Commonly known as: ZOCOR Take 20 mg by mouth every evening.            Durable Medical Equipment  (From admission, onward)         Start     Ordered   03/14/19 1726  DME 3 n 1  Once     03/14/19 1725   03/14/19 1726  DME Walker rolling  Once    Question:  Patient needs a walker to treat with the following condition  Answer:  Status post revision of total replacement of left knee   03/14/19 1725          Diagnostic Studies: Xr Knee 1-2 Views Left  Result Date: 02/24/2019 2 views of the left knee show a total knee arthroplasty with no evidence of loosening or complicating features.  There is no malalignment.  There is retained hardware laterally from a previous ligamentous reconstruction.  There is fullness of the soft tissue.  There is no evidence of osteolysis.   Disposition: Discharge disposition: 01-Home or Self Care         Follow-up Information    Mcarthur Rossetti, MD. Schedule an appointment as soon as possible for a visit in 2 week(s).   Specialty: Orthopedic Surgery Contact information: [place] [address] [phone]            Signed: Aundra Dubin 03/16/2019, 6:38 AM

## 2019-03-17 ENCOUNTER — Encounter (HOSPITAL_COMMUNITY): Payer: Self-pay | Admitting: Orthopaedic Surgery

## 2019-03-17 ENCOUNTER — Telehealth: Payer: Self-pay | Admitting: Orthopaedic Surgery

## 2019-03-17 NOTE — Telephone Encounter (Signed)
Patient aware we don't typically use the CPM machines. Also if no one has contacted her by tomorrow for HHPT, to give me a call

## 2019-03-17 NOTE — Telephone Encounter (Signed)
Pt lmom wanting to know if she needed the CPM machine for her leg and also she wanted to know when & where will she be doing her physical therapy (in home or outpatient). Callback # [phone]

## 2019-03-19 ENCOUNTER — Telehealth: Payer: Self-pay

## 2019-03-19 ENCOUNTER — Encounter: Payer: Self-pay | Admitting: Orthopaedic Surgery

## 2019-03-19 LAB — AEROBIC/ANAEROBIC CULTURE W GRAM STAIN (SURGICAL/DEEP WOUND): Culture: NO GROWTH

## 2019-03-19 NOTE — Telephone Encounter (Signed)
[name], can you help me? This patient just had surgery and was discharged and has been home since Sunday. Can [facility] get anyone out to see her ASAP? Hospital dropped the ball I guess and didn't schedule HHPT for her

## 2019-03-20 DIAGNOSIS — Z959 Presence of cardiac and vascular implant and graft, unspecified: Secondary | ICD-10-CM | POA: Diagnosis not present

## 2019-03-20 DIAGNOSIS — Z4789 Encounter for other orthopedic aftercare: Secondary | ICD-10-CM | POA: Diagnosis not present

## 2019-03-20 DIAGNOSIS — Z9181 History of falling: Secondary | ICD-10-CM | POA: Diagnosis not present

## 2019-03-20 DIAGNOSIS — Z96653 Presence of artificial knee joint, bilateral: Secondary | ICD-10-CM | POA: Diagnosis not present

## 2019-03-21 DIAGNOSIS — Z96653 Presence of artificial knee joint, bilateral: Secondary | ICD-10-CM | POA: Diagnosis not present

## 2019-03-21 DIAGNOSIS — Z9181 History of falling: Secondary | ICD-10-CM | POA: Diagnosis not present

## 2019-03-21 DIAGNOSIS — Z4789 Encounter for other orthopedic aftercare: Secondary | ICD-10-CM | POA: Diagnosis not present

## 2019-03-21 DIAGNOSIS — Z959 Presence of cardiac and vascular implant and graft, unspecified: Secondary | ICD-10-CM | POA: Diagnosis not present

## 2019-03-24 ENCOUNTER — Encounter: Payer: Self-pay | Admitting: Orthopaedic Surgery

## 2019-03-24 DIAGNOSIS — Z4789 Encounter for other orthopedic aftercare: Secondary | ICD-10-CM | POA: Diagnosis not present

## 2019-03-24 DIAGNOSIS — Z9181 History of falling: Secondary | ICD-10-CM | POA: Diagnosis not present

## 2019-03-24 DIAGNOSIS — Z96653 Presence of artificial knee joint, bilateral: Secondary | ICD-10-CM | POA: Diagnosis not present

## 2019-03-24 DIAGNOSIS — Z959 Presence of cardiac and vascular implant and graft, unspecified: Secondary | ICD-10-CM | POA: Diagnosis not present

## 2019-03-26 DIAGNOSIS — Z959 Presence of cardiac and vascular implant and graft, unspecified: Secondary | ICD-10-CM | POA: Diagnosis not present

## 2019-03-26 DIAGNOSIS — Z9181 History of falling: Secondary | ICD-10-CM | POA: Diagnosis not present

## 2019-03-26 DIAGNOSIS — Z4789 Encounter for other orthopedic aftercare: Secondary | ICD-10-CM | POA: Diagnosis not present

## 2019-03-26 DIAGNOSIS — Z96653 Presence of artificial knee joint, bilateral: Secondary | ICD-10-CM | POA: Diagnosis not present

## 2019-03-27 ENCOUNTER — Encounter: Payer: Self-pay | Admitting: Physician Assistant

## 2019-03-27 ENCOUNTER — Other Ambulatory Visit: Payer: Self-pay

## 2019-03-27 ENCOUNTER — Ambulatory Visit (INDEPENDENT_AMBULATORY_CARE_PROVIDER_SITE_OTHER): Payer: Medicare HMO | Admitting: Physician Assistant

## 2019-03-27 DIAGNOSIS — Z959 Presence of cardiac and vascular implant and graft, unspecified: Secondary | ICD-10-CM | POA: Diagnosis not present

## 2019-03-27 DIAGNOSIS — M659 Synovitis and tenosynovitis, unspecified: Secondary | ICD-10-CM

## 2019-03-27 DIAGNOSIS — Z96653 Presence of artificial knee joint, bilateral: Secondary | ICD-10-CM | POA: Diagnosis not present

## 2019-03-27 DIAGNOSIS — Z4789 Encounter for other orthopedic aftercare: Secondary | ICD-10-CM | POA: Diagnosis not present

## 2019-03-27 DIAGNOSIS — Z9181 History of falling: Secondary | ICD-10-CM | POA: Diagnosis not present

## 2019-03-27 MED ORDER — OXYCODONE HCL 5 MG PO TABS: 5.0000 mg | ORAL_TABLET | Freq: Four times a day (QID) | ORAL | 0 refills | Status: DC | PRN

## 2019-03-27 MED ORDER — METHOCARBAMOL 500 MG PO TABS: 500.0000 mg | ORAL_TABLET | Freq: Four times a day (QID) | ORAL | 1 refills | Status: DC | PRN

## 2019-03-27 NOTE — Progress Notes (Signed)
HPI: [name] is 68 year old female who returns today 2 weeks status post left knee irrigation and debridement with open synovectomy and poly-exchange and placement of stimulant beads.  Doing physical therapy at home.  Ambulates with a cane.  She has had no fevers chills shortness breath chest pain.  She is on aspirin 81 mg twice daily.  Physical exam: Left knee full extension able do leg lift on the left.  Staples well approximate wound.  Flexion to approximately 85 degrees full extension.  Calf supple nontender.  Impression: 2-week status post left knee irrigation debridement open synovectomy and poly-exchange and placement of stimulant beads  Plan: She will go to 81 mg aspirin once daily for the next week and then discontinue.  Refill on her pain medicine and muscle relaxants given today.  She will follow-up with [place] in 1 month sooner if there is any questions or concerns Staples removed Steri-Strips applied.  Questions encouraged and answered.

## 2019-03-31 DIAGNOSIS — R2689 Other abnormalities of gait and mobility: Secondary | ICD-10-CM | POA: Diagnosis not present

## 2019-03-31 DIAGNOSIS — R29898 Other symptoms and signs involving the musculoskeletal system: Secondary | ICD-10-CM | POA: Diagnosis not present

## 2019-03-31 DIAGNOSIS — M25562 Pain in left knee: Secondary | ICD-10-CM | POA: Diagnosis not present

## 2019-03-31 DIAGNOSIS — Z96652 Presence of left artificial knee joint: Secondary | ICD-10-CM | POA: Diagnosis not present

## 2019-03-31 DIAGNOSIS — M1990 Unspecified osteoarthritis, unspecified site: Secondary | ICD-10-CM | POA: Diagnosis not present

## 2019-03-31 DIAGNOSIS — Z4789 Encounter for other orthopedic aftercare: Secondary | ICD-10-CM | POA: Diagnosis not present

## 2019-03-31 DIAGNOSIS — M25662 Stiffness of left knee, not elsewhere classified: Secondary | ICD-10-CM | POA: Diagnosis not present

## 2019-04-03 DIAGNOSIS — R2689 Other abnormalities of gait and mobility: Secondary | ICD-10-CM | POA: Diagnosis not present

## 2019-04-03 DIAGNOSIS — Z96652 Presence of left artificial knee joint: Secondary | ICD-10-CM | POA: Diagnosis not present

## 2019-04-03 DIAGNOSIS — M1990 Unspecified osteoarthritis, unspecified site: Secondary | ICD-10-CM | POA: Diagnosis not present

## 2019-04-03 DIAGNOSIS — M25562 Pain in left knee: Secondary | ICD-10-CM | POA: Diagnosis not present

## 2019-04-03 DIAGNOSIS — Z4789 Encounter for other orthopedic aftercare: Secondary | ICD-10-CM | POA: Diagnosis not present

## 2019-04-03 DIAGNOSIS — R29898 Other symptoms and signs involving the musculoskeletal system: Secondary | ICD-10-CM | POA: Diagnosis not present

## 2019-04-03 DIAGNOSIS — M25662 Stiffness of left knee, not elsewhere classified: Secondary | ICD-10-CM | POA: Diagnosis not present

## 2019-04-05 DIAGNOSIS — R2689 Other abnormalities of gait and mobility: Secondary | ICD-10-CM | POA: Diagnosis not present

## 2019-04-05 DIAGNOSIS — M25662 Stiffness of left knee, not elsewhere classified: Secondary | ICD-10-CM | POA: Diagnosis not present

## 2019-04-05 DIAGNOSIS — Z96652 Presence of left artificial knee joint: Secondary | ICD-10-CM | POA: Diagnosis not present

## 2019-04-05 DIAGNOSIS — M25562 Pain in left knee: Secondary | ICD-10-CM | POA: Diagnosis not present

## 2019-04-05 DIAGNOSIS — Z4789 Encounter for other orthopedic aftercare: Secondary | ICD-10-CM | POA: Diagnosis not present

## 2019-04-05 DIAGNOSIS — R29898 Other symptoms and signs involving the musculoskeletal system: Secondary | ICD-10-CM | POA: Diagnosis not present

## 2019-04-05 DIAGNOSIS — M1990 Unspecified osteoarthritis, unspecified site: Secondary | ICD-10-CM | POA: Diagnosis not present

## 2019-04-07 DIAGNOSIS — M1990 Unspecified osteoarthritis, unspecified site: Secondary | ICD-10-CM | POA: Diagnosis not present

## 2019-04-07 DIAGNOSIS — Z96652 Presence of left artificial knee joint: Secondary | ICD-10-CM | POA: Diagnosis not present

## 2019-04-07 DIAGNOSIS — R2689 Other abnormalities of gait and mobility: Secondary | ICD-10-CM | POA: Diagnosis not present

## 2019-04-07 DIAGNOSIS — M25562 Pain in left knee: Secondary | ICD-10-CM | POA: Diagnosis not present

## 2019-04-07 DIAGNOSIS — Z4789 Encounter for other orthopedic aftercare: Secondary | ICD-10-CM | POA: Diagnosis not present

## 2019-04-07 DIAGNOSIS — R29898 Other symptoms and signs involving the musculoskeletal system: Secondary | ICD-10-CM | POA: Diagnosis not present

## 2019-04-07 DIAGNOSIS — M25662 Stiffness of left knee, not elsewhere classified: Secondary | ICD-10-CM | POA: Diagnosis not present

## 2019-04-09 DIAGNOSIS — R29898 Other symptoms and signs involving the musculoskeletal system: Secondary | ICD-10-CM | POA: Diagnosis not present

## 2019-04-09 DIAGNOSIS — Z96652 Presence of left artificial knee joint: Secondary | ICD-10-CM | POA: Diagnosis not present

## 2019-04-09 DIAGNOSIS — M1990 Unspecified osteoarthritis, unspecified site: Secondary | ICD-10-CM | POA: Diagnosis not present

## 2019-04-09 DIAGNOSIS — M25562 Pain in left knee: Secondary | ICD-10-CM | POA: Diagnosis not present

## 2019-04-09 DIAGNOSIS — R2689 Other abnormalities of gait and mobility: Secondary | ICD-10-CM | POA: Diagnosis not present

## 2019-04-09 DIAGNOSIS — Z4789 Encounter for other orthopedic aftercare: Secondary | ICD-10-CM | POA: Diagnosis not present

## 2019-04-09 DIAGNOSIS — M25662 Stiffness of left knee, not elsewhere classified: Secondary | ICD-10-CM | POA: Diagnosis not present

## 2019-04-11 DIAGNOSIS — M25662 Stiffness of left knee, not elsewhere classified: Secondary | ICD-10-CM | POA: Diagnosis not present

## 2019-04-11 DIAGNOSIS — R29898 Other symptoms and signs involving the musculoskeletal system: Secondary | ICD-10-CM | POA: Diagnosis not present

## 2019-04-11 DIAGNOSIS — M1990 Unspecified osteoarthritis, unspecified site: Secondary | ICD-10-CM | POA: Diagnosis not present

## 2019-04-11 DIAGNOSIS — M25562 Pain in left knee: Secondary | ICD-10-CM | POA: Diagnosis not present

## 2019-04-11 DIAGNOSIS — Z96652 Presence of left artificial knee joint: Secondary | ICD-10-CM | POA: Diagnosis not present

## 2019-04-11 DIAGNOSIS — R2689 Other abnormalities of gait and mobility: Secondary | ICD-10-CM | POA: Diagnosis not present

## 2019-04-11 DIAGNOSIS — Z4789 Encounter for other orthopedic aftercare: Secondary | ICD-10-CM | POA: Diagnosis not present

## 2019-04-17 DIAGNOSIS — M25662 Stiffness of left knee, not elsewhere classified: Secondary | ICD-10-CM | POA: Diagnosis not present

## 2019-04-17 DIAGNOSIS — M1990 Unspecified osteoarthritis, unspecified site: Secondary | ICD-10-CM | POA: Diagnosis not present

## 2019-04-17 DIAGNOSIS — Z4789 Encounter for other orthopedic aftercare: Secondary | ICD-10-CM | POA: Diagnosis not present

## 2019-04-17 DIAGNOSIS — Z96652 Presence of left artificial knee joint: Secondary | ICD-10-CM | POA: Diagnosis not present

## 2019-04-17 DIAGNOSIS — R2689 Other abnormalities of gait and mobility: Secondary | ICD-10-CM | POA: Diagnosis not present

## 2019-04-17 DIAGNOSIS — R29898 Other symptoms and signs involving the musculoskeletal system: Secondary | ICD-10-CM | POA: Diagnosis not present

## 2019-04-17 DIAGNOSIS — M25562 Pain in left knee: Secondary | ICD-10-CM | POA: Diagnosis not present

## 2019-04-18 DIAGNOSIS — Z96652 Presence of left artificial knee joint: Secondary | ICD-10-CM | POA: Diagnosis not present

## 2019-04-18 DIAGNOSIS — M25562 Pain in left knee: Secondary | ICD-10-CM | POA: Diagnosis not present

## 2019-04-18 DIAGNOSIS — M1990 Unspecified osteoarthritis, unspecified site: Secondary | ICD-10-CM | POA: Diagnosis not present

## 2019-04-18 DIAGNOSIS — R29898 Other symptoms and signs involving the musculoskeletal system: Secondary | ICD-10-CM | POA: Diagnosis not present

## 2019-04-18 DIAGNOSIS — Z4789 Encounter for other orthopedic aftercare: Secondary | ICD-10-CM | POA: Diagnosis not present

## 2019-04-18 DIAGNOSIS — M25662 Stiffness of left knee, not elsewhere classified: Secondary | ICD-10-CM | POA: Diagnosis not present

## 2019-04-18 DIAGNOSIS — R2689 Other abnormalities of gait and mobility: Secondary | ICD-10-CM | POA: Diagnosis not present

## 2019-04-21 DIAGNOSIS — Z4789 Encounter for other orthopedic aftercare: Secondary | ICD-10-CM | POA: Diagnosis not present

## 2019-04-21 DIAGNOSIS — M25662 Stiffness of left knee, not elsewhere classified: Secondary | ICD-10-CM | POA: Diagnosis not present

## 2019-04-21 DIAGNOSIS — R29898 Other symptoms and signs involving the musculoskeletal system: Secondary | ICD-10-CM | POA: Diagnosis not present

## 2019-04-21 DIAGNOSIS — Z96652 Presence of left artificial knee joint: Secondary | ICD-10-CM | POA: Diagnosis not present

## 2019-04-21 DIAGNOSIS — M1990 Unspecified osteoarthritis, unspecified site: Secondary | ICD-10-CM | POA: Diagnosis not present

## 2019-04-21 DIAGNOSIS — M25562 Pain in left knee: Secondary | ICD-10-CM | POA: Diagnosis not present

## 2019-04-21 DIAGNOSIS — R2689 Other abnormalities of gait and mobility: Secondary | ICD-10-CM | POA: Diagnosis not present

## 2019-04-22 DIAGNOSIS — Z4789 Encounter for other orthopedic aftercare: Secondary | ICD-10-CM | POA: Diagnosis not present

## 2019-04-22 DIAGNOSIS — R2689 Other abnormalities of gait and mobility: Secondary | ICD-10-CM | POA: Diagnosis not present

## 2019-04-22 DIAGNOSIS — M1990 Unspecified osteoarthritis, unspecified site: Secondary | ICD-10-CM | POA: Diagnosis not present

## 2019-04-22 DIAGNOSIS — M25662 Stiffness of left knee, not elsewhere classified: Secondary | ICD-10-CM | POA: Diagnosis not present

## 2019-04-22 DIAGNOSIS — R29898 Other symptoms and signs involving the musculoskeletal system: Secondary | ICD-10-CM | POA: Diagnosis not present

## 2019-04-22 DIAGNOSIS — M25562 Pain in left knee: Secondary | ICD-10-CM | POA: Diagnosis not present

## 2019-04-22 DIAGNOSIS — Z96652 Presence of left artificial knee joint: Secondary | ICD-10-CM | POA: Diagnosis not present

## 2019-04-23 DIAGNOSIS — G894 Chronic pain syndrome: Secondary | ICD-10-CM | POA: Diagnosis not present

## 2019-04-23 DIAGNOSIS — M25569 Pain in unspecified knee: Secondary | ICD-10-CM | POA: Diagnosis not present

## 2019-04-23 DIAGNOSIS — M792 Neuralgia and neuritis, unspecified: Secondary | ICD-10-CM | POA: Diagnosis not present

## 2019-04-23 DIAGNOSIS — G5780 Other specified mononeuropathies of unspecified lower limb: Secondary | ICD-10-CM | POA: Diagnosis not present

## 2019-04-24 ENCOUNTER — Encounter: Payer: Self-pay | Admitting: Physician Assistant

## 2019-04-24 ENCOUNTER — Ambulatory Visit (INDEPENDENT_AMBULATORY_CARE_PROVIDER_SITE_OTHER): Payer: Medicare HMO | Admitting: Physician Assistant

## 2019-04-24 DIAGNOSIS — M659 Synovitis and tenosynovitis, unspecified: Secondary | ICD-10-CM

## 2019-04-24 DIAGNOSIS — Z9889 Other specified postprocedural states: Secondary | ICD-10-CM

## 2019-04-24 NOTE — Progress Notes (Signed)
HPI [name] returns today 43 days status post irrigation and debridement with open synovectomy and poly-exchange with placement of stimulant beads left knee.  She is working with therapy 3 times a week.  And states that they have gotten her knee back to 110 but she cannot hold it there.  She is due to have an ablation on her knee this coming Monday.  Physical exam: Actively she can bend the knee to 90 degrees passively I can bring her to approximately 100 degrees.  No instability valgus varus stressing surgical incision left knee keloid formation due to multiple surgeries.  Calf supple nontender.  Ambulates with out assistive device.  Impression: Status post irrigation debridement with open synovectomy and poly-exchange with placement of stimulant beads left knee  Plan: She will undergo the ablation is scheduled this coming Monday.  We will see her back in 2 weeks to see how she is doing his pharmacy flexion.  She continues to be unable to actively bend beyond 90 degrees recommend a manipulation under anesthesia.  Questions were encouraged and answered at length.

## 2019-04-28 DIAGNOSIS — Z881 Allergy status to other antibiotic agents status: Secondary | ICD-10-CM | POA: Diagnosis not present

## 2019-04-28 DIAGNOSIS — M25562 Pain in left knee: Secondary | ICD-10-CM | POA: Diagnosis not present

## 2019-04-28 DIAGNOSIS — G894 Chronic pain syndrome: Secondary | ICD-10-CM | POA: Diagnosis not present

## 2019-04-28 DIAGNOSIS — M1712 Unilateral primary osteoarthritis, left knee: Secondary | ICD-10-CM | POA: Diagnosis not present

## 2019-04-28 DIAGNOSIS — Z79899 Other long term (current) drug therapy: Secondary | ICD-10-CM | POA: Diagnosis not present

## 2019-04-28 DIAGNOSIS — Z5181 Encounter for therapeutic drug level monitoring: Secondary | ICD-10-CM | POA: Diagnosis not present

## 2019-04-28 DIAGNOSIS — Z880 Allergy status to penicillin: Secondary | ICD-10-CM | POA: Diagnosis not present

## 2019-04-30 DIAGNOSIS — M25662 Stiffness of left knee, not elsewhere classified: Secondary | ICD-10-CM | POA: Diagnosis not present

## 2019-04-30 DIAGNOSIS — Z4789 Encounter for other orthopedic aftercare: Secondary | ICD-10-CM | POA: Diagnosis not present

## 2019-04-30 DIAGNOSIS — Z96652 Presence of left artificial knee joint: Secondary | ICD-10-CM | POA: Diagnosis not present

## 2019-04-30 DIAGNOSIS — M25562 Pain in left knee: Secondary | ICD-10-CM | POA: Diagnosis not present

## 2019-04-30 DIAGNOSIS — R2689 Other abnormalities of gait and mobility: Secondary | ICD-10-CM | POA: Diagnosis not present

## 2019-04-30 DIAGNOSIS — M1990 Unspecified osteoarthritis, unspecified site: Secondary | ICD-10-CM | POA: Diagnosis not present

## 2019-04-30 DIAGNOSIS — R29898 Other symptoms and signs involving the musculoskeletal system: Secondary | ICD-10-CM | POA: Diagnosis not present

## 2019-05-01 DIAGNOSIS — M1990 Unspecified osteoarthritis, unspecified site: Secondary | ICD-10-CM | POA: Diagnosis not present

## 2019-05-01 DIAGNOSIS — R29898 Other symptoms and signs involving the musculoskeletal system: Secondary | ICD-10-CM | POA: Diagnosis not present

## 2019-05-01 DIAGNOSIS — R2689 Other abnormalities of gait and mobility: Secondary | ICD-10-CM | POA: Diagnosis not present

## 2019-05-01 DIAGNOSIS — M25562 Pain in left knee: Secondary | ICD-10-CM | POA: Diagnosis not present

## 2019-05-01 DIAGNOSIS — Z96652 Presence of left artificial knee joint: Secondary | ICD-10-CM | POA: Diagnosis not present

## 2019-05-01 DIAGNOSIS — Z4789 Encounter for other orthopedic aftercare: Secondary | ICD-10-CM | POA: Diagnosis not present

## 2019-05-01 DIAGNOSIS — M25662 Stiffness of left knee, not elsewhere classified: Secondary | ICD-10-CM | POA: Diagnosis not present

## 2019-05-05 DIAGNOSIS — R2689 Other abnormalities of gait and mobility: Secondary | ICD-10-CM | POA: Diagnosis not present

## 2019-05-05 DIAGNOSIS — R29898 Other symptoms and signs involving the musculoskeletal system: Secondary | ICD-10-CM | POA: Diagnosis not present

## 2019-05-05 DIAGNOSIS — M25562 Pain in left knee: Secondary | ICD-10-CM | POA: Diagnosis not present

## 2019-05-05 DIAGNOSIS — M25662 Stiffness of left knee, not elsewhere classified: Secondary | ICD-10-CM | POA: Diagnosis not present

## 2019-05-05 DIAGNOSIS — Z96652 Presence of left artificial knee joint: Secondary | ICD-10-CM | POA: Diagnosis not present

## 2019-05-05 DIAGNOSIS — M1990 Unspecified osteoarthritis, unspecified site: Secondary | ICD-10-CM | POA: Diagnosis not present

## 2019-05-05 DIAGNOSIS — Z4789 Encounter for other orthopedic aftercare: Secondary | ICD-10-CM | POA: Diagnosis not present

## 2019-05-06 DIAGNOSIS — R29898 Other symptoms and signs involving the musculoskeletal system: Secondary | ICD-10-CM | POA: Diagnosis not present

## 2019-05-06 DIAGNOSIS — M25562 Pain in left knee: Secondary | ICD-10-CM | POA: Diagnosis not present

## 2019-05-06 DIAGNOSIS — Z4789 Encounter for other orthopedic aftercare: Secondary | ICD-10-CM | POA: Diagnosis not present

## 2019-05-06 DIAGNOSIS — M1990 Unspecified osteoarthritis, unspecified site: Secondary | ICD-10-CM | POA: Diagnosis not present

## 2019-05-06 DIAGNOSIS — Z96652 Presence of left artificial knee joint: Secondary | ICD-10-CM | POA: Diagnosis not present

## 2019-05-06 DIAGNOSIS — R2689 Other abnormalities of gait and mobility: Secondary | ICD-10-CM | POA: Diagnosis not present

## 2019-05-06 DIAGNOSIS — M25662 Stiffness of left knee, not elsewhere classified: Secondary | ICD-10-CM | POA: Diagnosis not present

## 2019-05-07 DIAGNOSIS — R2689 Other abnormalities of gait and mobility: Secondary | ICD-10-CM | POA: Diagnosis not present

## 2019-05-07 DIAGNOSIS — Z96652 Presence of left artificial knee joint: Secondary | ICD-10-CM | POA: Diagnosis not present

## 2019-05-07 DIAGNOSIS — M25562 Pain in left knee: Secondary | ICD-10-CM | POA: Diagnosis not present

## 2019-05-07 DIAGNOSIS — M1990 Unspecified osteoarthritis, unspecified site: Secondary | ICD-10-CM | POA: Diagnosis not present

## 2019-05-07 DIAGNOSIS — M25662 Stiffness of left knee, not elsewhere classified: Secondary | ICD-10-CM | POA: Diagnosis not present

## 2019-05-07 DIAGNOSIS — Z4789 Encounter for other orthopedic aftercare: Secondary | ICD-10-CM | POA: Diagnosis not present

## 2019-05-07 DIAGNOSIS — R29898 Other symptoms and signs involving the musculoskeletal system: Secondary | ICD-10-CM | POA: Diagnosis not present

## 2019-05-08 ENCOUNTER — Encounter: Payer: Self-pay | Admitting: Physician Assistant

## 2019-05-08 ENCOUNTER — Ambulatory Visit (INDEPENDENT_AMBULATORY_CARE_PROVIDER_SITE_OTHER): Payer: Medicare HMO | Admitting: Physician Assistant

## 2019-05-08 VITALS — Ht 67.0 in | Wt 145.0 lb

## 2019-05-08 DIAGNOSIS — M659 Synovitis and tenosynovitis, unspecified: Secondary | ICD-10-CM

## 2019-05-08 DIAGNOSIS — Z9889 Other specified postprocedural states: Secondary | ICD-10-CM

## 2019-05-08 NOTE — Progress Notes (Signed)
HPI: [name] comes in today almost 3 weeks status post left knee poly-exchange and synovectomy.  She is working with physical therapy.  She states she has better motion in the morning but as her swelling increases throughout the day flexion definitely becomes more limited.  Is taking Tylenol for pain.  She has had no calf pain shortness of breath fevers or chills.  Physical exam: Left knee full extension active flexion to 90 degrees passively I can bring her to 105 degrees.  She has definite global edema about the knee but no abnormal warmth erythema or effusion.  Surgical incisions well-healed.  There is no ecchymosis or erythema about the knee.  Left calf supple nontender.  Ambulates without any assistive device.  Impression: Status post left knee I&D with poly-exchange and antibiotic stimulant bead placement  Plan: She will continue to work on range of motion strengthening the knee.  Continues compression stockings during the day.  Ice elevation encouraged.  Scar tissue mobilization encouraged.  Follow-up with [place] in 1 month.

## 2019-05-13 DIAGNOSIS — M25562 Pain in left knee: Secondary | ICD-10-CM | POA: Diagnosis not present

## 2019-05-13 DIAGNOSIS — M25662 Stiffness of left knee, not elsewhere classified: Secondary | ICD-10-CM | POA: Diagnosis not present

## 2019-05-13 DIAGNOSIS — R2689 Other abnormalities of gait and mobility: Secondary | ICD-10-CM | POA: Diagnosis not present

## 2019-05-13 DIAGNOSIS — R29898 Other symptoms and signs involving the musculoskeletal system: Secondary | ICD-10-CM | POA: Diagnosis not present

## 2019-05-13 DIAGNOSIS — Z96652 Presence of left artificial knee joint: Secondary | ICD-10-CM | POA: Diagnosis not present

## 2019-05-13 DIAGNOSIS — Z4789 Encounter for other orthopedic aftercare: Secondary | ICD-10-CM | POA: Diagnosis not present

## 2019-05-13 DIAGNOSIS — M1990 Unspecified osteoarthritis, unspecified site: Secondary | ICD-10-CM | POA: Diagnosis not present

## 2019-05-14 DIAGNOSIS — Z4789 Encounter for other orthopedic aftercare: Secondary | ICD-10-CM | POA: Diagnosis not present

## 2019-05-14 DIAGNOSIS — M25562 Pain in left knee: Secondary | ICD-10-CM | POA: Diagnosis not present

## 2019-05-14 DIAGNOSIS — M1990 Unspecified osteoarthritis, unspecified site: Secondary | ICD-10-CM | POA: Diagnosis not present

## 2019-05-14 DIAGNOSIS — R2689 Other abnormalities of gait and mobility: Secondary | ICD-10-CM | POA: Diagnosis not present

## 2019-05-14 DIAGNOSIS — Z96652 Presence of left artificial knee joint: Secondary | ICD-10-CM | POA: Diagnosis not present

## 2019-05-14 DIAGNOSIS — R29898 Other symptoms and signs involving the musculoskeletal system: Secondary | ICD-10-CM | POA: Diagnosis not present

## 2019-05-14 DIAGNOSIS — M25662 Stiffness of left knee, not elsewhere classified: Secondary | ICD-10-CM | POA: Diagnosis not present

## 2019-05-19 DIAGNOSIS — M1712 Unilateral primary osteoarthritis, left knee: Secondary | ICD-10-CM | POA: Diagnosis not present

## 2019-05-19 DIAGNOSIS — M792 Neuralgia and neuritis, unspecified: Secondary | ICD-10-CM | POA: Diagnosis not present

## 2019-05-19 DIAGNOSIS — G894 Chronic pain syndrome: Secondary | ICD-10-CM | POA: Diagnosis not present

## 2019-05-19 DIAGNOSIS — G5782 Other specified mononeuropathies of left lower limb: Secondary | ICD-10-CM | POA: Diagnosis not present

## 2019-05-20 DIAGNOSIS — Z96652 Presence of left artificial knee joint: Secondary | ICD-10-CM | POA: Diagnosis not present

## 2019-05-20 DIAGNOSIS — M1990 Unspecified osteoarthritis, unspecified site: Secondary | ICD-10-CM | POA: Diagnosis not present

## 2019-05-20 DIAGNOSIS — R2689 Other abnormalities of gait and mobility: Secondary | ICD-10-CM | POA: Diagnosis not present

## 2019-05-20 DIAGNOSIS — M25662 Stiffness of left knee, not elsewhere classified: Secondary | ICD-10-CM | POA: Diagnosis not present

## 2019-05-20 DIAGNOSIS — Z4789 Encounter for other orthopedic aftercare: Secondary | ICD-10-CM | POA: Diagnosis not present

## 2019-05-20 DIAGNOSIS — R29898 Other symptoms and signs involving the musculoskeletal system: Secondary | ICD-10-CM | POA: Diagnosis not present

## 2019-05-20 DIAGNOSIS — M25562 Pain in left knee: Secondary | ICD-10-CM | POA: Diagnosis not present

## 2019-05-21 DIAGNOSIS — R29898 Other symptoms and signs involving the musculoskeletal system: Secondary | ICD-10-CM | POA: Diagnosis not present

## 2019-05-21 DIAGNOSIS — M25662 Stiffness of left knee, not elsewhere classified: Secondary | ICD-10-CM | POA: Diagnosis not present

## 2019-05-21 DIAGNOSIS — R2689 Other abnormalities of gait and mobility: Secondary | ICD-10-CM | POA: Diagnosis not present

## 2019-05-21 DIAGNOSIS — Z96652 Presence of left artificial knee joint: Secondary | ICD-10-CM | POA: Diagnosis not present

## 2019-05-21 DIAGNOSIS — M25562 Pain in left knee: Secondary | ICD-10-CM | POA: Diagnosis not present

## 2019-05-21 DIAGNOSIS — Z4789 Encounter for other orthopedic aftercare: Secondary | ICD-10-CM | POA: Diagnosis not present

## 2019-05-21 DIAGNOSIS — M1990 Unspecified osteoarthritis, unspecified site: Secondary | ICD-10-CM | POA: Diagnosis not present

## 2019-06-10 ENCOUNTER — Encounter: Payer: Self-pay | Admitting: Orthopaedic Surgery

## 2019-06-10 ENCOUNTER — Ambulatory Visit (INDEPENDENT_AMBULATORY_CARE_PROVIDER_SITE_OTHER): Payer: Medicare HMO | Admitting: Orthopaedic Surgery

## 2019-06-10 DIAGNOSIS — M659 Synovitis and tenosynovitis, unspecified: Secondary | ICD-10-CM

## 2019-06-10 MED ORDER — CELECOXIB 200 MG PO CAPS: 200.0000 mg | ORAL_CAPSULE | Freq: Two times a day (BID) | ORAL | 1 refills | Status: DC

## 2019-06-10 NOTE — Progress Notes (Signed)
HPI: Mrs. Alen returns today almost 3 months status post left knee synovectomy with poly-exchange and placement of antibiotic stimulant beads.  She reports that overall she feels she is no better than prior to surgery.  She has significant pain in the knee she is taking only Tylenol.  Is taking no NSAIDs.  She has a lot of swelling.  She is frustrated because she cannot get back to the activities she wants to do.  Has not been physical therapy for the past 2 weeks that she has been on vacation.  Physical exam: Left knee full extension flexion to only 90 degrees both passive and active.  Calf supple nontender.  No instability.  Surgical incisions well healing.  No signs of abnormal warmth erythema.  Impression: 3 months status post left knee open synovectomy with poly-exchange and placement of antibiotic stimulant beads.  Plan: Have her continue work on range of motion strengthening.  We will place her on Celebrex 200 mg twice daily.  See her back in 3 months at that time we will repeat 2 views of the left knee.  Questions encouraged and answered at length by Dr. Ninfa Linden and  myself.

## 2019-06-16 DIAGNOSIS — G5780 Other specified mononeuropathies of unspecified lower limb: Secondary | ICD-10-CM | POA: Diagnosis not present

## 2019-06-16 DIAGNOSIS — M792 Neuralgia and neuritis, unspecified: Secondary | ICD-10-CM | POA: Diagnosis not present

## 2019-06-16 DIAGNOSIS — G894 Chronic pain syndrome: Secondary | ICD-10-CM | POA: Diagnosis not present

## 2019-06-16 DIAGNOSIS — G5782 Other specified mononeuropathies of left lower limb: Secondary | ICD-10-CM | POA: Diagnosis not present

## 2019-06-17 DIAGNOSIS — M25562 Pain in left knee: Secondary | ICD-10-CM | POA: Diagnosis not present

## 2019-06-17 DIAGNOSIS — Z4789 Encounter for other orthopedic aftercare: Secondary | ICD-10-CM | POA: Diagnosis not present

## 2019-06-17 DIAGNOSIS — Z96652 Presence of left artificial knee joint: Secondary | ICD-10-CM | POA: Diagnosis not present

## 2019-06-17 DIAGNOSIS — R29898 Other symptoms and signs involving the musculoskeletal system: Secondary | ICD-10-CM | POA: Diagnosis not present

## 2019-06-17 DIAGNOSIS — M25662 Stiffness of left knee, not elsewhere classified: Secondary | ICD-10-CM | POA: Diagnosis not present

## 2019-06-17 DIAGNOSIS — M1990 Unspecified osteoarthritis, unspecified site: Secondary | ICD-10-CM | POA: Diagnosis not present

## 2019-06-17 DIAGNOSIS — R2689 Other abnormalities of gait and mobility: Secondary | ICD-10-CM | POA: Diagnosis not present

## 2019-06-18 ENCOUNTER — Other Ambulatory Visit: Payer: Self-pay | Admitting: Cardiovascular Disease

## 2019-07-07 ENCOUNTER — Encounter: Payer: Self-pay | Admitting: Orthopaedic Surgery

## 2019-07-08 ENCOUNTER — Encounter: Payer: Self-pay | Admitting: Orthopaedic Surgery

## 2019-07-08 ENCOUNTER — Other Ambulatory Visit: Payer: Self-pay

## 2019-07-08 ENCOUNTER — Ambulatory Visit (INDEPENDENT_AMBULATORY_CARE_PROVIDER_SITE_OTHER): Payer: Medicare HMO | Admitting: Orthopaedic Surgery

## 2019-07-08 DIAGNOSIS — M25562 Pain in left knee: Secondary | ICD-10-CM | POA: Diagnosis not present

## 2019-07-08 DIAGNOSIS — Z96652 Presence of left artificial knee joint: Secondary | ICD-10-CM

## 2019-07-08 MED ORDER — OXYCODONE HCL 5 MG PO TABS: 5.0000 mg | ORAL_TABLET | Freq: Four times a day (QID) | ORAL | 0 refills | Status: DC | PRN

## 2019-07-08 MED ORDER — CLINDAMYCIN HCL 150 MG PO CAPS: 300.0000 mg | ORAL_CAPSULE | Freq: Three times a day (TID) | ORAL | 0 refills | Status: DC

## 2019-07-08 NOTE — Addendum Note (Signed)
Addended by: Jacklyn Shell on: 07/08/2019 10:44 AM   Modules accepted: Orders

## 2019-07-08 NOTE — Progress Notes (Signed)
Office Visit Note   Patient: [name]           Date of Birth: 1950/11/12           MRN: [id number] Visit Date: 07/08/2019              Requested by: [name], [name] [address] [place],  [place] [address] PCP: Amanda Gravel, MD   Assessment & Plan: Visit Diagnoses:  1. Status post revision of total replacement of left knee   2. Acute pain of left knee     Plan: I am going to send that fluid for Gram stain and culture.  I will then start her on clindamycin 300 mg 3 times a day and would like to see her back in just 2 days.  I am also going obtain a CBC, a sed rate, and a CRP.  I cautioned her that she may need a more extensive surgery but it is interesting where this area where she is having issues is outside the surgical knee site in terms of where she had the liner exchange and the synovectomy.  We will see what she looks like 2 days from now.  All question concerns were answered and addressed.  I am concerned that she may need more of an extensive intervention.  I am starting her on antibiotics since I have been able to at least obtain fluid from the knee that we can send for Gram stain and culture.  Follow-Up Instructions: Return in about 2 days (around 07/10/2019).   Orders:  No orders of the defined types were placed in this encounter.  Meds ordered this encounter  Medications  . oxyCODONE (OXY IR/ROXICODONE) 5 MG immediate release tablet    Sig: Take 1-2 tablets (5-10 mg total) by mouth every 6 (six) hours as needed for moderate pain (pain score 4-6).    Dispense:  30 tablet    Refill:  0  . clindamycin (CLEOCIN) 150 MG capsule    Sig: Take 2 capsules (300 mg total) by mouth 3 (three) times daily.    Dispense:  60 capsule    Refill:  0      Procedures: No procedures performed   Clinical Data: No additional findings.   Subjective: Chief Complaint  Patient presents with  . Left Knee - Routine Post Op, Wound Check  The patient is getting close  to 4 months status post a left knee arthrotomy with synovectomy and polyliner exchange.  She is someone who had bilateral knee replacements done by one of my colleagues in town.  She had done well for a period of time but then experienced acute pain in her knee after sprints on an exercise bike.  She is 68 years old.  She saw me for second opinion and I was surprisingly able to aspirate fluid from her knee and it showed decently high white blood cell count which led [place] to an open arthrotomy with assessment of the knee further given the white blood cell count the knee which is supported in orthopedic literature with proceeding with an irrigation debridement because the risk of infection.  We did not find any infection in her knee and since then she has still had pain and swelling with her knee.  She had done well until she developed more swelling in her knee.  She has an old lateral incision from exploration and decompression of her nerve on that side.  This is now painful and raised and  red.  HPI  Review of Systems She currently denies any fever, chills, nausea, vomiting.  She denies any chest pain or shortness of breath.  Objective: Vital Signs: There were no vitals taken for this visit.  Physical Exam She is alert and orient x3 and in no acute distress Ortho Exam Examination of her left knee shows a raised area over the lateral aspect of her old incision that is red and inflamed and painful.  It looks like a bulla.  I was able to aspirate some fluid from this. Specialty Comments:  No specialty comments available.  Imaging: No results found.   PMFS History: Patient Active Problem List   Diagnosis Date Noted  . Synovitis of left knee 03/14/2019  . Status post revision of total replacement of left knee 03/14/2019  . Essential hypertension 09/03/2017  . Unstable angina ([place]) 08/31/2017  . 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 Constipation due to pain medication   .  Post-operative pain   . Abnormality of gait   . Acute blood loss anemia 07/06/2016  . S/P bilateral TKAs 07/03/2016   Past Medical History:  Diagnosis Date  . Arthritis    osteoarthritis knees mostly  . Chest pain    a. 08/2017 MV: mid-basilar anteroseptal ischemia;  b. 08/2017 Cath: LM nl, LAD 48m, D2 30, RI nl, LCX 20ost, RCA large, nl, EF 65%.  . Diastolic dysfunction    a. 08/2017 Echo: EF 60-65%, no rwma, Gr1 DD.  Marland Kitchen Hypokalemia    takes losartan for this    Family History  Problem Relation Age of Onset  . Breast cancer Mother   . Stroke Father   . Heart attack Paternal Grandfather     Past Surgical History:  Procedure Laterality Date  . DIAGNOSTIC LAPAROSCOPY     ovarian cystectomy/ BSO done.  Marland Kitchen HARDWARE REMOVAL Left 07/03/2016   Procedure: LEFT KNEE HARDWARE REMOVAL;  Surgeon: Paralee Cancel, MD;  Location: [facility];  Service: Orthopedics;  Laterality: Left;  . KNEE SURGERY Bilateral    ACL x2, meniscectomy x3, open patella(bone from other knee)  . LEFT HEART CATH AND CORONARY ANGIOGRAPHY N/A 09/03/2017   Procedure: LEFT HEART CATH AND CORONARY ANGIOGRAPHY;  Surgeon: Nelva Bush, MD;  Location: [facility];  Service: Cardiovascular;  Laterality: N/A;  . ROTATOR CUFF REPAIR Right   . SYNOVECTOMY WITH POLY EXCHANGE Left 03/14/2019   Procedure: IRRIGATION AND DEBRIDEMENT, OPEN SYNOVECTOMY LEFT KNEE WITH POLY EXCHANGE, PLACEMENT OF STIMULAN BEADS;  Surgeon: Mcarthur Rossetti, MD;  Location: [facility];  Service: Orthopedics;  Laterality: Left;  . TOTAL KNEE ARTHROPLASTY Bilateral 07/03/2016   Procedure: TOTAL KNEE BILATERAL ARTHROPLASTY;  Surgeon: Paralee Cancel, MD;  Location: [facility];  Service: Orthopedics;  Laterality: Bilateral;   Social History   Occupational History  . Not on file  Tobacco Use  . Smoking status: Never Smoker  . Smokeless tobacco: Never Used  Substance and Sexual Activity  . Alcohol use: Yes    Comment: social x3-4 week  . Drug use: No  .  Sexual activity: Yes    Birth control/protection: None

## 2019-07-10 ENCOUNTER — Other Ambulatory Visit: Payer: Self-pay

## 2019-07-10 ENCOUNTER — Other Ambulatory Visit: Payer: Self-pay | Admitting: Physician Assistant

## 2019-07-10 ENCOUNTER — Encounter: Payer: Self-pay | Admitting: Orthopaedic Surgery

## 2019-07-10 ENCOUNTER — Other Ambulatory Visit (HOSPITAL_COMMUNITY)
Admission: RE | Admit: 2019-07-10 | Discharge: 2019-07-10 | Disposition: A | Discharge status: Home / Self Care | Payer: Medicare HMO | Source: Ambulatory Visit | Attending: Orthopaedic Surgery | Admitting: Orthopaedic Surgery

## 2019-07-10 ENCOUNTER — Encounter (HOSPITAL_COMMUNITY): Payer: Self-pay | Admitting: *Deleted

## 2019-07-10 ENCOUNTER — Ambulatory Visit: Payer: Medicare HMO | Admitting: Orthopaedic Surgery

## 2019-07-10 DIAGNOSIS — Z96652 Presence of left artificial knee joint: Secondary | ICD-10-CM

## 2019-07-10 DIAGNOSIS — Z20828 Contact with and (suspected) exposure to other viral communicable diseases: Secondary | ICD-10-CM | POA: Insufficient documentation

## 2019-07-10 DIAGNOSIS — L03116 Cellulitis of left lower limb: Secondary | ICD-10-CM | POA: Diagnosis not present

## 2019-07-10 DIAGNOSIS — Z01812 Encounter for preprocedural laboratory examination: Secondary | ICD-10-CM | POA: Insufficient documentation

## 2019-07-10 LAB — SARS CORONAVIRUS 2 (TAT 6-24 HRS): SARS Coronavirus 2: NEGATIVE

## 2019-07-10 NOTE — Progress Notes (Signed)
The patient is someone well-known to me.  She had bilateral total knee arthroplasties done by one of my colleagues in town and October 2017.  She had done well with her right knee but her left knee has had issues with time.  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  She has been felt something happened in her knee earlier this year when she was aggressively riding an exercise bike.  She is 68 years old.  She came to me for second opinion and I was able to aspirate fluid from her left knee that showed 17,000 white cells.  We took her to the operating room and performed an open synovectomy with a polyliner exchange.  Cultures never grew out any infection from the knee.  She has been Jordan her knee since then.  It was in July of this year when I performed that surgery.  She presented 2 days ago to the office with a bulla that had developed over her lateral incision that had been done previously.  This was not approach was used for our recent surgery in July.  I was able to aspirate fluid off this area.  It had just over thousand white cells and no organisms thus far on Gram stain.  Her CBC showed a normal white blood cell count.  Her CRP and sed rate there were elevated.  Since I did obtain a culture I put her on doxycycline.  2 days later she still has redness around the lateral aspect of her knee and it is warm and it looks slightly worse clinically to me.  She says she does not feel well either.  Based on what I am seeing clinically in the office today I do feel she warrants an open irrigation debridement of the lateral aspect of her proximal tibia and knee incision and an arthroscopic intervention of the knee to obtain the synovium within the joint and to see if there is anything else going in the knee that would warrant any other intervention such as even an excision arthroplasty.  She understands that we need to proceed to surgery tomorrow.   She will stop any antibiotics.  She will be n.p.o. after midnight and we will pursue surgery in the afternoon tomorrow.  All question concerns were answered and addressed.  We discussed the risk and benefits of surgery.  I would plan to have her over the weekend to see what culture show and to have on IV antibiotics.

## 2019-07-11 ENCOUNTER — Encounter (HOSPITAL_COMMUNITY): Payer: Self-pay | Admitting: *Deleted

## 2019-07-11 ENCOUNTER — Other Ambulatory Visit: Payer: Self-pay

## 2019-07-11 ENCOUNTER — Inpatient Hospital Stay (HOSPITAL_COMMUNITY): Payer: Medicare HMO

## 2019-07-11 ENCOUNTER — Inpatient Hospital Stay (HOSPITAL_COMMUNITY)
Admission: RE | Admit: 2019-07-11 | Discharge: 2019-07-14 | DRG: 493 | Disposition: A | Discharge status: Home / Self Care | Payer: Medicare HMO | Attending: Orthopaedic Surgery | Admitting: Orthopaedic Surgery

## 2019-07-11 ENCOUNTER — Inpatient Hospital Stay (HOSPITAL_COMMUNITY): Payer: Medicare HMO | Admitting: Certified Registered Nurse Anesthetist

## 2019-07-11 ENCOUNTER — Encounter (HOSPITAL_COMMUNITY)
Admission: RE | Disposition: A | Discharge status: Home / Self Care | Payer: Self-pay | Source: Home / Self Care | Attending: Orthopaedic Surgery

## 2019-07-11 DIAGNOSIS — Z8249 Family history of ischemic heart disease and other diseases of the circulatory system: Secondary | ICD-10-CM

## 2019-07-11 DIAGNOSIS — L03116 Cellulitis of left lower limb: Secondary | ICD-10-CM | POA: Diagnosis present

## 2019-07-11 DIAGNOSIS — Z803 Family history of malignant neoplasm of breast: Secondary | ICD-10-CM

## 2019-07-11 DIAGNOSIS — Z880 Allergy status to penicillin: Secondary | ICD-10-CM | POA: Diagnosis not present

## 2019-07-11 DIAGNOSIS — I10 Essential (primary) hypertension: Secondary | ICD-10-CM | POA: Diagnosis not present

## 2019-07-11 DIAGNOSIS — Z20828 Contact with and (suspected) exposure to other viral communicable diseases: Secondary | ICD-10-CM | POA: Diagnosis not present

## 2019-07-11 DIAGNOSIS — I200 Unstable angina: Secondary | ICD-10-CM | POA: Diagnosis not present

## 2019-07-11 DIAGNOSIS — Z96652 Presence of left artificial knee joint: Secondary | ICD-10-CM | POA: Diagnosis not present

## 2019-07-11 DIAGNOSIS — Z881 Allergy status to other antibiotic agents status: Secondary | ICD-10-CM | POA: Diagnosis not present

## 2019-07-11 DIAGNOSIS — T8454XA Infection and inflammatory reaction due to internal left knee prosthesis, initial encounter: Principal | ICD-10-CM | POA: Diagnosis present

## 2019-07-11 DIAGNOSIS — Z823 Family history of stroke: Secondary | ICD-10-CM | POA: Diagnosis not present

## 2019-07-11 DIAGNOSIS — Z7982 Long term (current) use of aspirin: Secondary | ICD-10-CM

## 2019-07-11 DIAGNOSIS — M009 Pyogenic arthritis, unspecified: Secondary | ICD-10-CM | POA: Diagnosis present

## 2019-07-11 DIAGNOSIS — Z471 Aftercare following joint replacement surgery: Secondary | ICD-10-CM | POA: Diagnosis not present

## 2019-07-11 DIAGNOSIS — D62 Acute posthemorrhagic anemia: Secondary | ICD-10-CM | POA: Diagnosis not present

## 2019-07-11 HISTORY — PX: I & D EXTREMITY: SHX5045

## 2019-07-11 HISTORY — PX: KNEE ARTHROSCOPY: SHX127

## 2019-07-11 LAB — SYNOVIAL CELL COUNT + DIFF, W/ CRYSTALS
Crystals, Fluid: NONE SEEN
Eosinophils-Synovial: 0 % (ref 0–1)
Lymphocytes-Synovial Fld: 1 % (ref 0–20)
Monocyte-Macrophage-Synovial Fluid: 1 % — ABNORMAL LOW (ref 50–90)
Neutrophil, Synovial: 98 % — ABNORMAL HIGH (ref 0–25)
WBC, Synovial: UNDETERMINED /mm3 (ref 0–200)

## 2019-07-11 SURGERY — IRRIGATION AND DEBRIDEMENT EXTREMITY
Anesthesia: General | Site: Knee | Laterality: Left

## 2019-07-11 MED ORDER — HYDROMORPHONE HCL 1 MG/ML IJ SOLN
0.2500 mg | INTRAMUSCULAR | Status: DC | PRN
  Administered 2019-07-11 (×2): 0.5 mg via INTRAVENOUS

## 2019-07-11 MED ORDER — FENTANYL CITRATE (PF) 250 MCG/5ML IJ SOLN
INTRAMUSCULAR | Status: AC
  Filled 2019-07-11: qty 5

## 2019-07-11 MED ORDER — OXYCODONE HCL 5 MG/5ML PO SOLN: 5.0000 mg | Freq: Once | ORAL | Status: DC | PRN

## 2019-07-11 MED ORDER — OXYCODONE HCL 5 MG PO TABS
5.0000 mg | ORAL_TABLET | ORAL | Status: DC | PRN
  Administered 2019-07-11 – 2019-07-12 (×2): 5 mg via ORAL
  Administered 2019-07-12: 10 mg via ORAL
  Administered 2019-07-12 – 2019-07-13 (×2): 5 mg via ORAL
  Administered 2019-07-13: 10 mg via ORAL
  Filled 2019-07-11: qty 2
  Filled 2019-07-11 (×4): qty 1
  Filled 2019-07-11 (×2): qty 2

## 2019-07-11 MED ORDER — FENTANYL CITRATE (PF) 100 MCG/2ML IJ SOLN
INTRAMUSCULAR | Status: AC
  Administered 2019-07-11: 50 ug via INTRAVENOUS
  Filled 2019-07-11: qty 2

## 2019-07-11 MED ORDER — OXYCODONE HCL 5 MG PO TABS: 10.0000 mg | ORAL_TABLET | ORAL | Status: DC | PRN

## 2019-07-11 MED ORDER — ONDANSETRON HCL 4 MG/2ML IJ SOLN
INTRAMUSCULAR | Status: AC
  Filled 2019-07-11: qty 2

## 2019-07-11 MED ORDER — LACTATED RINGERS IV SOLN
INTRAVENOUS | Status: DC
  Administered 2019-07-11 (×2): via INTRAVENOUS

## 2019-07-11 MED ORDER — DIPHENHYDRAMINE HCL 12.5 MG/5ML PO ELIX: 12.5000 mg | ORAL_SOLUTION | ORAL | Status: DC | PRN

## 2019-07-11 MED ORDER — POLYETHYLENE GLYCOL 3350 17 G PO PACK: 17.0000 g | PACK | Freq: Every day | ORAL | Status: DC | PRN

## 2019-07-11 MED ORDER — METOCLOPRAMIDE HCL 5 MG/ML IJ SOLN: 5.0000 mg | Freq: Three times a day (TID) | INTRAMUSCULAR | Status: DC | PRN

## 2019-07-11 MED ORDER — METHOCARBAMOL 500 MG IVPB - SIMPLE MED
500.0000 mg | Freq: Four times a day (QID) | INTRAVENOUS | Status: DC | PRN
  Administered 2019-07-11: 500 mg via INTRAVENOUS
  Filled 2019-07-11: qty 50

## 2019-07-11 MED ORDER — METHOCARBAMOL 500 MG PO TABS: 500.0000 mg | ORAL_TABLET | Freq: Four times a day (QID) | ORAL | Status: DC | PRN

## 2019-07-11 MED ORDER — CHLORHEXIDINE GLUCONATE 4 % EX LIQD: 60.0000 mL | Freq: Once | CUTANEOUS | Status: DC

## 2019-07-11 MED ORDER — SODIUM CHLORIDE 0.9 % IR SOLN
Status: DC | PRN
  Administered 2019-07-11: 3000 mL

## 2019-07-11 MED ORDER — ARTIFICIAL TEARS OPHTHALMIC OINT
TOPICAL_OINTMENT | OPHTHALMIC | Status: AC
  Filled 2019-07-11: qty 3.5

## 2019-07-11 MED ORDER — MIDAZOLAM HCL 2 MG/2ML IJ SOLN
INTRAMUSCULAR | Status: AC
  Filled 2019-07-11: qty 2

## 2019-07-11 MED ORDER — FENTANYL CITRATE (PF) 100 MCG/2ML IJ SOLN
INTRAMUSCULAR | Status: DC | PRN
  Administered 2019-07-11: 75 ug via INTRAVENOUS
  Administered 2019-07-11: 50 ug via INTRAVENOUS
  Administered 2019-07-11: 25 ug via INTRAVENOUS
  Administered 2019-07-11: 100 ug via INTRAVENOUS

## 2019-07-11 MED ORDER — METOCLOPRAMIDE HCL 5 MG PO TABS: 5.0000 mg | ORAL_TABLET | Freq: Three times a day (TID) | ORAL | Status: DC | PRN

## 2019-07-11 MED ORDER — ONDANSETRON HCL 4 MG/2ML IJ SOLN
INTRAMUSCULAR | Status: DC | PRN
  Administered 2019-07-11: 4 mg via INTRAVENOUS

## 2019-07-11 MED ORDER — ACETAMINOPHEN 325 MG PO TABS
325.0000 mg | ORAL_TABLET | Freq: Four times a day (QID) | ORAL | Status: DC | PRN
  Administered 2019-07-12 – 2019-07-14 (×2): 650 mg via ORAL
  Filled 2019-07-11 (×2): qty 2

## 2019-07-11 MED ORDER — VANCOMYCIN HCL 1000 MG IV SOLR
INTRAVENOUS | Status: AC
  Filled 2019-07-11: qty 1000

## 2019-07-11 MED ORDER — LOSARTAN POTASSIUM 25 MG PO TABS
25.0000 mg | ORAL_TABLET | Freq: Every day | ORAL | Status: DC
  Administered 2019-07-12 – 2019-07-14 (×3): 25 mg via ORAL
  Filled 2019-07-11 (×3): qty 1

## 2019-07-11 MED ORDER — PHENYLEPHRINE HCL (PRESSORS) 10 MG/ML IV SOLN
INTRAVENOUS | Status: DC | PRN
  Administered 2019-07-11: 80 ug via INTRAVENOUS

## 2019-07-11 MED ORDER — FENTANYL CITRATE (PF) 100 MCG/2ML IJ SOLN
25.0000 ug | INTRAMUSCULAR | Status: DC | PRN
  Administered 2019-07-11 (×3): 50 ug via INTRAVENOUS

## 2019-07-11 MED ORDER — SODIUM CHLORIDE 0.9 % IR SOLN
Status: DC | PRN
  Administered 2019-07-11: 1000 mL

## 2019-07-11 MED ORDER — ONDANSETRON HCL 4 MG/2ML IJ SOLN: 4.0000 mg | Freq: Four times a day (QID) | INTRAMUSCULAR | Status: DC | PRN

## 2019-07-11 MED ORDER — ONDANSETRON HCL 4 MG PO TABS: 4.0000 mg | ORAL_TABLET | Freq: Four times a day (QID) | ORAL | Status: DC | PRN

## 2019-07-11 MED ORDER — BUPIVACAINE HCL (PF) 0.5 % IJ SOLN
INTRAMUSCULAR | Status: AC
  Filled 2019-07-11: qty 30

## 2019-07-11 MED ORDER — MIDAZOLAM HCL 5 MG/5ML IJ SOLN
INTRAMUSCULAR | Status: DC | PRN
  Administered 2019-07-11: 2 mg via INTRAVENOUS

## 2019-07-11 MED ORDER — SODIUM CHLORIDE 0.9 % IV SOLN
INTRAVENOUS | Status: DC
  Administered 2019-07-11: 1000 mL via INTRAVENOUS

## 2019-07-11 MED ORDER — DEXAMETHASONE SODIUM PHOSPHATE 10 MG/ML IJ SOLN
INTRAMUSCULAR | Status: DC | PRN
  Administered 2019-07-11: 10 mg via INTRAVENOUS

## 2019-07-11 MED ORDER — VITAMIN D 25 MCG (1000 UNIT) PO TABS
5000.0000 [IU] | ORAL_TABLET | Freq: Every day | ORAL | Status: DC
  Administered 2019-07-11 – 2019-07-14 (×4): 5000 [IU] via ORAL
  Filled 2019-07-11 (×5): qty 5

## 2019-07-11 MED ORDER — PROPOFOL 10 MG/ML IV BOLUS
INTRAVENOUS | Status: DC | PRN
  Administered 2019-07-11: 150 mg via INTRAVENOUS
  Administered 2019-07-11: 50 mg via INTRAVENOUS

## 2019-07-11 MED ORDER — VANCOMYCIN HCL IN DEXTROSE 1-5 GM/200ML-% IV SOLN
1000.0000 mg | Freq: Once | INTRAVENOUS | Status: AC
  Administered 2019-07-11: 1000 mg via INTRAVENOUS
  Filled 2019-07-11: qty 200

## 2019-07-11 MED ORDER — DULOXETINE HCL 60 MG PO CPEP
60.0000 mg | ORAL_CAPSULE | Freq: Two times a day (BID) | ORAL | Status: DC
  Administered 2019-07-11 – 2019-07-14 (×6): 60 mg via ORAL
  Filled 2019-07-11 (×6): qty 1

## 2019-07-11 MED ORDER — PANTOPRAZOLE SODIUM 40 MG PO TBEC
40.0000 mg | DELAYED_RELEASE_TABLET | Freq: Every day | ORAL | Status: DC
  Administered 2019-07-11 – 2019-07-14 (×4): 40 mg via ORAL
  Filled 2019-07-11 (×4): qty 1

## 2019-07-11 MED ORDER — VANCOMYCIN HCL 1000 MG IV SOLR
INTRAVENOUS | Status: DC | PRN
  Administered 2019-07-11: 1000 mg via TOPICAL

## 2019-07-11 MED ORDER — METHOCARBAMOL 500 MG PO TABS
500.0000 mg | ORAL_TABLET | Freq: Four times a day (QID) | ORAL | Status: DC | PRN
  Administered 2019-07-12 – 2019-07-14 (×4): 500 mg via ORAL
  Filled 2019-07-11 (×5): qty 1

## 2019-07-11 MED ORDER — HYDROMORPHONE HCL 1 MG/ML IJ SOLN
INTRAMUSCULAR | Status: AC
  Administered 2019-07-11: 0.5 mg via INTRAVENOUS
  Filled 2019-07-11: qty 1

## 2019-07-11 MED ORDER — LIDOCAINE HCL (CARDIAC) PF 50 MG/5ML IV SOSY
PREFILLED_SYRINGE | INTRAVENOUS | Status: DC | PRN
  Administered 2019-07-11: 60 mg via INTRAVENOUS

## 2019-07-11 MED ORDER — NITROGLYCERIN 0.4 MG SL SUBL: 0.4000 mg | SUBLINGUAL_TABLET | SUBLINGUAL | Status: DC | PRN

## 2019-07-11 MED ORDER — OXYCODONE HCL 5 MG PO TABS: 5.0000 mg | ORAL_TABLET | Freq: Once | ORAL | Status: DC | PRN

## 2019-07-11 MED ORDER — PROPOFOL 10 MG/ML IV BOLUS
INTRAVENOUS | Status: AC
  Filled 2019-07-11: qty 20

## 2019-07-11 MED ORDER — METHOCARBAMOL 500 MG IVPB - SIMPLE MED
INTRAVENOUS | Status: AC
  Filled 2019-07-11: qty 50

## 2019-07-11 MED ORDER — DEXAMETHASONE SODIUM PHOSPHATE 10 MG/ML IJ SOLN
INTRAMUSCULAR | Status: AC
  Filled 2019-07-11: qty 1

## 2019-07-11 MED ORDER — DOCUSATE SODIUM 100 MG PO CAPS
100.0000 mg | ORAL_CAPSULE | Freq: Two times a day (BID) | ORAL | Status: DC
  Administered 2019-07-11 – 2019-07-14 (×6): 100 mg via ORAL
  Filled 2019-07-11 (×6): qty 1

## 2019-07-11 MED ORDER — SIMVASTATIN 20 MG PO TABS
20.0000 mg | ORAL_TABLET | Freq: Every evening | ORAL | Status: DC
  Administered 2019-07-11 – 2019-07-13 (×3): 20 mg via ORAL
  Filled 2019-07-11 (×3): qty 1

## 2019-07-11 MED ORDER — HYDROMORPHONE HCL 1 MG/ML IJ SOLN: 0.5000 mg | INTRAMUSCULAR | Status: DC | PRN

## 2019-07-11 MED ORDER — LIDOCAINE 2% (20 MG/ML) 5 ML SYRINGE
INTRAMUSCULAR | Status: AC
  Filled 2019-07-11: qty 5

## 2019-07-11 MED ORDER — FENTANYL CITRATE (PF) 100 MCG/2ML IJ SOLN
INTRAMUSCULAR | Status: AC
  Administered 2019-07-11: 18:00:00 50 ug via INTRAVENOUS
  Filled 2019-07-11: qty 2

## 2019-07-11 SURGICAL SUPPLY — 46 items
BLADE CUDA SHAVER 3.5 (BLADE) ×3 IMPLANT
BNDG ELASTIC 6X10 VLCR STRL LF (GAUZE/BANDAGES/DRESSINGS) ×3 IMPLANT
BNDG ELASTIC 6X5.8 VLCR STR LF (GAUZE/BANDAGES/DRESSINGS) ×3 IMPLANT
CONT SPEC 4OZ CLIKSEAL STRL BL (MISCELLANEOUS) ×3 IMPLANT
COVER SURGICAL LIGHT HANDLE (MISCELLANEOUS) ×3 IMPLANT
COVER WAND RF STERILE (DRAPES) IMPLANT
CUFF TOURN SGL QUICK 34 (TOURNIQUET CUFF) ×1
CUFF TRNQT CYL 34X4.125X (TOURNIQUET CUFF) ×2 IMPLANT
DISSECTOR 4.0MM X 13CM (MISCELLANEOUS) ×3 IMPLANT
DRAPE U-SHAPE 47X51 STRL (DRAPES) ×3 IMPLANT
DRSG PAD ABDOMINAL 8X10 ST (GAUZE/BANDAGES/DRESSINGS) ×6 IMPLANT
DURAPREP 26ML APPLICATOR (WOUND CARE) ×3 IMPLANT
GAUZE 4X4 16PLY RFD (DISPOSABLE) ×3 IMPLANT
GAUZE SPONGE 4X4 12PLY STRL (GAUZE/BANDAGES/DRESSINGS) ×3 IMPLANT
GAUZE XEROFORM 1X8 LF (GAUZE/BANDAGES/DRESSINGS) ×3 IMPLANT
GLOVE BIO SURGEON STRL SZ7.5 (GLOVE) ×3 IMPLANT
GLOVE BIOGEL PI IND STRL 8 (GLOVE) ×4 IMPLANT
GLOVE BIOGEL PI INDICATOR 8 (GLOVE) ×2
GLOVE ECLIPSE 8.0 STRL XLNG CF (GLOVE) ×3 IMPLANT
GOWN STRL REUS W/TWL XL LVL3 (GOWN DISPOSABLE) ×6 IMPLANT
IRRIG SUCT STRYKERFLOW 2 WTIP (MISCELLANEOUS) ×3
IRRIGATION SUCT STRKRFLW 2 WTP (MISCELLANEOUS) ×2 IMPLANT
KIT STIMULAN RAPID CURE  10CC (Orthopedic Implant) ×1 IMPLANT
KIT STIMULAN RAPID CURE 10CC (Orthopedic Implant) ×2 IMPLANT
KIT TURNOVER KIT A (KITS) IMPLANT
MANIFOLD NEPTUNE II (INSTRUMENTS) ×3 IMPLANT
NDL SAFETY ECLIPSE 18X1.5 (NEEDLE) ×2 IMPLANT
NEEDLE HYPO 18GX1.5 SHARP (NEEDLE) ×1
PACK ARTHROSCOPY WL (CUSTOM PROCEDURE TRAY) ×3 IMPLANT
PADDING CAST COTTON 6X4 STRL (CAST SUPPLIES) ×3 IMPLANT
PROTECTOR NERVE ULNAR (MISCELLANEOUS) ×3 IMPLANT
SPONGE LAP 18X18 RF (DISPOSABLE) ×3 IMPLANT
SUT ETHILON 2 0 PS N (SUTURE) ×3 IMPLANT
SUT ETHILON 3 0 PS 1 (SUTURE) ×3 IMPLANT
SUT ETHILON 4 0 PS 2 18 (SUTURE) ×3 IMPLANT
SUT VIC AB 0 CT1 36 (SUTURE) ×3 IMPLANT
SUT VIC AB 2-0 CT1 27 (SUTURE) ×1
SUT VIC AB 2-0 CT1 TAPERPNT 27 (SUTURE) ×2 IMPLANT
SWAB COLLECTION DEVICE MRSA (MISCELLANEOUS) ×3 IMPLANT
SWAB CULTURE ESWAB REG 1ML (MISCELLANEOUS) ×3 IMPLANT
SYR 50ML LL SCALE MARK (SYRINGE) ×3 IMPLANT
SYR BULB IRRIGATION 50ML (SYRINGE) ×3 IMPLANT
TUBING ARTHROSCOPY IRRIG 16FT (MISCELLANEOUS) ×3 IMPLANT
WAND HAND CNTRL MULTIVAC 90 (MISCELLANEOUS) IMPLANT
WRAP KNEE MAXI GEL POST OP (GAUZE/BANDAGES/DRESSINGS) ×3 IMPLANT
YANKAUER SUCT BULB TIP NO VENT (SUCTIONS) ×6 IMPLANT

## 2019-07-11 NOTE — Brief Op Note (Signed)
07/11/2019  5:03 PM  PATIENT:  [name]  68 y.o. female  PRE-OPERATIVE DIAGNOSIS:  left knee infection  POST-OPERATIVE DIAGNOSIS:  left knee infection  PROCEDURE:  Incision and debridement of left knee/proximal tibia wound and placement of Stimulin antibiotic beads  SURGEON:  Surgeon(s) and Role:    Mcarthur Rossetti, MD - Primary  PHYSICIAN ASSISTANT: Benita Stabile, PA-C  ANESTHESIA:   general  EBL:  100 mL   COUNTS:  YES  DICTATION: .Other Dictation: Dictation Number [id number]  PLAN OF CARE: Admit to inpatient   PATIENT DISPOSITION:  PACU - hemodynamically stable.   Delay start of Pharmacological VTE agent (>24hrs) due to surgical blood loss or risk of bleeding: no

## 2019-07-11 NOTE — H&P (Signed)
[name] is an 68 y.o. female.   Chief Complaint: Left knee pain, swelling, and redness HPI: The patient is a 68 year old female who in 2017 underwent bilateral total knee arthroplasties by one of my colleagues in town.  Prior to that she has had previous surgeries on that knee from other injuries in terms of the left knee and had an even decompression of the peroneal nerves.  Her bilateral knee arthroplasty was successful and she has had no significant issues until earlier this year when she felt some type of pop in her knee when she was aggressively riding her exercise bike incredibly fast.  She developed a significant mouth swelling and did see my colleague in town.  He was unable to remove any fluid from her knee and just want her to watch it which I thought was reasonable.  She did seek out me for second opinion.  I did see her knee it was warm but not red.  Surprisingly I was able to aspirate fluid from the knee and it did not grow any organisms but had 17,000 white blood cells in the synovial fluid which made me concerned for possible infection.  She was taken to the operating room for open synovectomy and polyliner exchange.  Nothing grew out from any of the cultures intraoperative.  This was in July of this year.  She presented to the office this week with redness at her old lateral incision that was done remotely.  There was a small bulla in this area and I removed some fluid from this and sent for Gram stain and culture what is been negative but when I saw her back in the office she has letter after oral antibiotics there was still significant redness in the swollen area.  With that being said I have recommended an irrigation debridement of the lateral aspect of her knee but also an arthroscopic evaluation of the knee joint itself in terms of the continued swelling so we can get an idea of if there is continued synovitis or questionable infection.  She understands at some point this may be a deep  indolent infection that would require excision arthroplasty.  Past Medical History:  Diagnosis Date  . Arthritis    osteoarthritis knees mostly  . Chest pain    a. 08/2017 MV: mid-basilar anteroseptal ischemia;  b. 08/2017 Cath: LM nl, LAD 52m, D2 30, RI nl, LCX 20ost, RCA large, nl, EF 65%.  . Diastolic dysfunction    a. 08/2017 Echo: EF 60-65%, no rwma, Gr1 DD.  Marland Kitchen Hypokalemia    takes losartan for this    Past Surgical History:  Procedure Laterality Date  . DIAGNOSTIC LAPAROSCOPY     ovarian cystectomy/ BSO done.  Marland Kitchen HARDWARE REMOVAL Left 07/03/2016   Procedure: LEFT KNEE HARDWARE REMOVAL;  Surgeon: Paralee Cancel, MD;  Location: [facility];  Service: Orthopedics;  Laterality: Left;  . KNEE SURGERY Bilateral    ACL x2, meniscectomy x3, open patella(bone from other knee)  . LEFT HEART CATH AND CORONARY ANGIOGRAPHY N/A 09/03/2017   Procedure: LEFT HEART CATH AND CORONARY ANGIOGRAPHY;  Surgeon: Nelva Bush, MD;  Location: [facility];  Service: Cardiovascular;  Laterality: N/A;  . ROTATOR CUFF REPAIR Right   . SYNOVECTOMY WITH POLY EXCHANGE Left 03/14/2019   Procedure: IRRIGATION AND DEBRIDEMENT, OPEN SYNOVECTOMY LEFT KNEE WITH POLY EXCHANGE, PLACEMENT OF STIMULAN BEADS;  Surgeon: Mcarthur Rossetti, MD;  Location: [facility];  Service: Orthopedics;  Laterality: Left;  . TOTAL KNEE  ARTHROPLASTY Bilateral 07/03/2016   Procedure: TOTAL KNEE BILATERAL ARTHROPLASTY;  Surgeon: Paralee Cancel, MD;  Location: [facility];  Service: Orthopedics;  Laterality: Bilateral;    Family History  Problem Relation Age of Onset  . Breast cancer Mother   . Stroke Father   . Heart attack Paternal Grandfather    Social History:  reports that she has never smoked. She has never used smokeless tobacco. She reports current alcohol use. She reports that she does not use drugs.  Allergies:  Allergies  Allergen Reactions  . Penicillins Anaphylaxis and Swelling    Did it involve swelling of the  face/tongue/throat, SOB, or low BP? Yes Did it involve sudden or severe rash/hives, skin peeling, or any reaction on the inside of your mouth or nose? No Did you need to seek medical attention at a hospital or doctor's office? Yes When did it last happen?40 + years If all above answers are "NO", may proceed with cephalosporin use.   . Tetracyclines & Related Hives    Medications Prior to Admission  Medication Sig Dispense Refill  . acetaminophen (TYLENOL) 500 MG tablet Take 1,000 mg by mouth every 6 (six) hours as needed for moderate pain or headache.    . Cholecalciferol (DIALYVITE VITAMIN D 5000) 125 MCG (5000 UT) capsule Take 5,000 Units by mouth daily.    . DULoxetine (CYMBALTA) 60 MG capsule Take 60 mg by mouth 2 (two) times daily.    Marland Kitchen losartan (COZAAR) 25 MG tablet TAKE ONE TABLET BY MOUTH DAILY (Patient taking differently: Take 25 mg by mouth daily. ) 90 tablet 3  . omeprazole (PRILOSEC) 40 MG capsule Take 20 mg by mouth daily.   6  . oxyCODONE (OXY IR/ROXICODONE) 5 MG immediate release tablet Take 1-2 tablets (5-10 mg total) by mouth every 6 (six) hours as needed for moderate pain (pain score 4-6). (Patient taking differently: Take 5 mg by mouth every 6 (six) hours as needed for moderate pain (pain score 4-6). ) 30 tablet 0  . simvastatin (ZOCOR) 20 MG tablet Take 20 mg by mouth every evening.   6  . TURMERIC PO Take 1 capsule by mouth at bedtime.    Marland Kitchen aspirin 81 MG chewable tablet Chew 1 tablet (81 mg total) by mouth 2 (two) times daily. (Patient not taking: Reported on 07/10/2019) 30 tablet 0  . celecoxib (CELEBREX) 200 MG capsule Take 1 capsule (200 mg total) by mouth 2 (two) times daily. (Patient not taking: Reported on 07/10/2019) 60 capsule 1  . clindamycin (CLEOCIN) 150 MG capsule Take 2 capsules (300 mg total) by mouth 3 (three) times daily. (Patient not taking: Reported on 07/10/2019) 60 capsule 0  . methocarbamol (ROBAXIN) 500 MG tablet Take 1 tablet (500 mg total) by  mouth every 6 (six) hours as needed for muscle spasms. 40 tablet 1  . nitroGLYCERIN (NITROSTAT) 0.4 MG SL tablet Place 1 tablet (0.4 mg total) under the tongue every 5 (five) minutes x 3 doses as needed for chest pain. 25 tablet 3    Results for orders placed or performed during the hospital encounter of 07/10/19 (from the past 48 hour(s))  SARS CORONAVIRUS 2 (TAT 6-24 HRS) Nasopharyngeal Nasopharyngeal Swab     Status: None   Collection Time: 07/10/19  2:51 PM   Specimen: Nasopharyngeal Swab  Result Value Ref Range   SARS Coronavirus 2 NEGATIVE NEGATIVE    Comment: (NOTE) SARS-CoV-2 target nucleic acids are NOT DETECTED. The SARS-CoV-2 RNA is generally detectable in upper and  lower respiratory specimens during the acute phase of infection. Negative results do not preclude SARS-CoV-2 infection, do not rule out co-infections with other pathogens, and should not be used as the sole basis for treatment or other patient management decisions. Negative results must be combined with clinical observations, patient history, and epidemiological information. The expected result is Negative. Fact Sheet for Patients: SugarRoll.be Fact Sheet for Healthcare Providers: https://www.woods-mathews.com/ This test is not yet approved or cleared by the [place] FDA and  has been authorized for detection and/or diagnosis of SARS-CoV-2 by FDA under an Emergency Use Authorization (EUA). This EUA will remain  in effect (meaning this test can be used) for the duration of the COVID-19 declaration under Section 56 4(b)(1) of the Act, 21 U.S.C. section 360bbb-3(b)(1), unless the authorization is terminated or revoked sooner. Performed at [facility], [place] [address], [place], [place] [address]    No results found.  Review of Systems  Constitutional: Positive for malaise/fatigue.  Musculoskeletal: Positive for joint pain.  All other systems reviewed and  are negative.   Blood pressure 114/73, pulse 75, temperature 99.9 F (37.7 C), temperature source Oral, resp. rate 18, height 5\' 7"  (1.702 m), weight 68 kg, SpO2 100 %. Physical Exam  Constitutional: She is oriented to person, place, and time. She appears well-developed and well-nourished.  HENT:  Head: Normocephalic and atraumatic.  Eyes: Pupils are equal, round, and reactive to light. EOM are normal.  Neck: Normal range of motion. Neck supple.  Cardiovascular: Normal rate and regular rhythm.  Respiratory: Effort normal and breath sounds normal.  GI: Soft. Bowel sounds are normal.  Musculoskeletal:     Left knee: She exhibits swelling, effusion and erythema. Tenderness found. Lateral joint line tenderness noted.  Neurological: She is alert and oriented to person, place, and time.  Skin: Skin is warm and dry.  Psychiatric: She has a normal mood and affect.     Assessment/Plan Persistent synovitis left knee with new cellulitis on the lateral aspect and a small bulla laterally  We will proceed to the operating room today for an open irrigation debridement of the lateral aspect of her proximal tibia followed by an arthroscopic intervention of the knee itself on the left side with a synovectomy.  No antibiotics will be given until we have some adequate cultures again.  She understands she will be admitted as an inpatient so we can have her on IV antibiotics till he see what the next course of action will be depending on her interoperative findings.  Risk benefits of surgery were explained in detail and she does wish to proceed.  Informed consent is obtained.  Mcarthur Rossetti, MD 07/11/2019, 1:11 PM

## 2019-07-11 NOTE — Anesthesia Procedure Notes (Signed)
Performed by: Cache Bills E, CRNA       

## 2019-07-11 NOTE — Transfer of Care (Signed)
Immediate Anesthesia Transfer of Care Note  Patient: [name]  Procedure(s) Performed: IRRIGATION AND DEBRIDEMENT LEFT KNEE,PLACEMENT OF STIMULAN BEADS (Left ) ARTHROSCOPY KNEE (Left Knee)  Patient Location: PACU  Anesthesia Type:General  Level of Consciousness: awake, alert , oriented and patient cooperative  Airway & Oxygen Therapy: Patient Spontanous Breathing and Patient connected to face mask oxygen  Post-op Assessment: Report given to RN, Post -op Vital signs reviewed and stable and Patient moving all extremities X 4  Post vital signs: stable  Last Vitals:  Vitals Value Taken Time  BP 136/81 07/11/19 1715  Temp 36.7 C 07/11/19 1714  Pulse 97 07/11/19 1718  Resp 19 07/11/19 1718  SpO2 100 % 07/11/19 1718  Vitals shown include unvalidated device data.  Last Pain:  Vitals:   07/11/19 1248  TempSrc:   PainSc: 5       Patients Stated Pain Goal: 4 ([id number])  Complications: No apparent anesthesia complications

## 2019-07-11 NOTE — Anesthesia Procedure Notes (Signed)
Procedure Name: LMA Insertion Date/Time: 07/11/2019 4:01 PM Performed by: Lissa Morales, CRNA Pre-anesthesia Checklist: Patient identified, Emergency Drugs available, Suction available and Patient being monitored Patient Re-evaluated:Patient Re-evaluated prior to induction Oxygen Delivery Method: Circle system utilized Preoxygenation: Pre-oxygenation with 100% oxygen Induction Type: IV induction Ventilation: Mask ventilation without difficulty LMA: LMA inserted LMA Size: 4.0 Tube type: Oral Number of attempts: 1 Airway Equipment and Method: Oral airway Placement Confirmation: positive ETCO2 Tube secured with: Tape Dental Injury: Teeth and Oropharynx as per pre-operative assessment

## 2019-07-11 NOTE — Anesthesia Preprocedure Evaluation (Addendum)
Anesthesia Evaluation  Patient identified by MRN, date of birth, ID band Patient awake    Reviewed: Allergy & Precautions, H&P , NPO status , Patient's Chart, lab work & pertinent test results  Airway Mallampati: II   Neck ROM: full    Dental   Pulmonary neg pulmonary ROS,    breath sounds clear to auscultation       Cardiovascular hypertension, + angina  Rhythm:regular Rate:Normal     Neuro/Psych    GI/Hepatic   Endo/Other    Renal/GU      Musculoskeletal  (+) Arthritis ,   Abdominal   Peds  Hematology   Anesthesia Other Findings   Reproductive/Obstetrics                             Anesthesia Physical Anesthesia Plan  ASA: II  Anesthesia Plan: General   Post-op Pain Management:    Induction: Intravenous  PONV Risk Score and Plan: 3 and Ondansetron, Dexamethasone, Midazolam and Treatment may vary due to age or medical condition  Airway Management Planned: LMA  Additional Equipment:   Intra-op Plan:   Post-operative Plan:   Informed Consent: I have reviewed the patients History and Physical, chart, labs and discussed the procedure including the risks, benefits and alternatives for the proposed anesthesia with the patient or authorized representative who has indicated his/her understanding and acceptance.       Plan Discussed with: CRNA, Anesthesiologist and Surgeon  Anesthesia Plan Comments:        Anesthesia Quick Evaluation

## 2019-07-12 LAB — CBC
HCT: 37.3 % (ref 36.0–46.0)
Hemoglobin: 11.4 g/dL — ABNORMAL LOW (ref 12.0–15.0)
MCH: 28 pg (ref 26.0–34.0)
MCHC: 30.6 g/dL (ref 30.0–36.0)
MCV: 91.6 fL (ref 80.0–100.0)
Platelets: 278 10*3/uL (ref 150–400)
RBC: 4.07 MIL/uL (ref 3.87–5.11)
RDW: 13.5 % (ref 11.5–15.5)
WBC: 5.5 10*3/uL (ref 4.0–10.5)
nRBC: 0 % (ref 0.0–0.2)

## 2019-07-12 LAB — BASIC METABOLIC PANEL
Anion gap: 8 (ref 5–15)
BUN: 8 mg/dL (ref 8–23)
CO2: 28 mmol/L (ref 22–32)
Calcium: 9 mg/dL (ref 8.9–10.3)
Chloride: 103 mmol/L (ref 98–111)
Creatinine, Ser: 0.72 mg/dL (ref 0.44–1.00)
GFR calc Af Amer: >60 mL/min (ref >60)
GFR calc non Af Amer: >60 mL/min (ref >60)
Glucose, Bld: 122 mg/dL — ABNORMAL HIGH (ref 70–99)
Potassium: 4.8 mmol/L (ref 3.5–5.1)
Sodium: 139 mmol/L (ref 135–145)

## 2019-07-12 LAB — C-REACTIVE PROTEIN: CRP: 1.2 mg/dL — ABNORMAL HIGH (ref <1.0)

## 2019-07-12 NOTE — Op Note (Signed)
NAME: [name], [name] MEDICAL RECORD [id number] ACCOUNT [id number] DATE OF BIRTH:[birth year] FACILITY: [facility] LOCATION: [facility] PHYSICIAN:Shanika Levings Kerry Fort, MD  OPERATIVE REPORT  DATE OF PROCEDURE:  07/11/2019  PREOPERATIVE DIAGNOSIS:  Left proximal tibia cellulitis with a bulla and wound infection.  POSTOPERATIVE DIAGNOSIS:  Left proximal tibia cellulitis with a bulla and wound infection.  FINDINGS:   1.  Proximal lateral tibia wound with a sinus tract down deep into the proximal tibial bone. 2.  Cultures pending.  PROCEDURE:  Placement of antibiotic Stimulan beads with vancomycin into the proximal tibia wound.  SURGEON:  Jonn Shingles, MD  ASSISTANT:  Erskine Emery, PA-C  ANESTHESIA:  General.  ESTIMATED BLOOD LOSS:  Less than 100 mL.  COMPLICATIONS:  None.  INDICATIONS:  The patient is a 68 year old female with a complicated history that involves her left knee.  About 30 years ago, she sustained some type of ligamentous injury to that knee and had a reconstruction through medial and lateral incisions.  In  late 2017, one of my colleagues in town performed bilateral total knee arthroplasties on her due to severe arthritis in both her knees.  Both knees did decently well, with the right knee, doing exceptionally well, but the left knee did require a repeat  operation just for neurolysis of the infrapatellar branch of the saphenous nerve where she was having persistent irritation.  Early this year, she was riding her exercise bike vigorously when she felt a pop in her knee and developed knee swelling.  She  did see her original orthopedic surgeon who was unable to obtain any draining fluid from the knee and appropriately had her watch the knee to see how she would do.  After continued swelling, she sought a second opinion with me and when I saw her in my  office in June of this year, I found a swollen knee, but no redness.  She did have lateral pain at the proximal  tibia where she had previous bone staple from likely lateral collateral ACL reconstruction from her previous injury.  The knee replacement  itself looked good under x-rays.  I did aspirate fluid from the knee and I was able to get fluid from her knee and it showed 17,000 white cells, but no organisms.  It was appropriate then to take her to the operating room for an open synovectomy with  irrigation, debridement and a poly liner exchange to get an idea if there was a deep infection in her knee and determine if there was any loosening of the components.  We found no deep infection in her knee at the time of surgery in July and performed a  synovectomy and poly exchange and no cultures ever grew out of her knee and she has never been on antibiotics prior to that.  Her only other infectious source was just swabbing positive for staph at previous surgeries.  She had been always on the  appropriate antibiotics.  She did present to the office this week with redness on the lateral aspect of her knee at the distal most aspect of her incision that was done 30 years ago.  There was a bulla in this area and I was able to aspirate fluid from  this.  The cultures have not grown anything from there and the white cell count was 1000, but I felt that at that point, it would be appropriate since I had cultures, to put her on clindamycin for a day or 2 and see how she responded.  Two days later,  she came to the office and the redness and pain were worsening.  She was feeling fatigued.  Her peripheral white blood cell count was normal, but a sed rate was elevated at 43 and a CRP was also elevated at I believe above 12.  With continued redness, I  recommended an open irrigation and debridement of this area and a possible arthroscopic I and D of the knee joint, but I felt it was depending on what we found at the lateral aspect of her knee.  She agreed to this.  She now presents for the surgery  today.  We had a long and  thorough discussion about the risks and benefits and goals of surgery.  DESCRIPTION OF PROCEDURE:  After informed consent was obtained, the appropriate left knee was marked.  She was brought to the operating room and placed supine on the operating table.  General anesthesia was then obtained.  Her left thigh, knee, leg and  ankle were prepped and draped with DuraPrep and sterile drapes and a sterile stockinette was utilized as well.  A time-out was called to identify correct patient and correct left knee.  We then assessed this bulla area and it was certainly raised and had  associated cellulitis around it.  We made an incision directly over this area and encountered some type of material that almost did appear purulent, but also necrotic.  We immediately took cultures from this area.  We had not given her antibiotics yet.   I then opened up the tissue further and found a hole, which was felt to be a hole in the lateral tibia that tracks deep and potentially to the implant itself of the tibia implant from her total knee replacement.  We used a curet and curetted this out of  this area, but it is certainly worrisome for having potential infection in the bone.  We used a #15 blade to sharply debride some necrotic fascia in this area and some slight necrotic skin just from where the bulla was.  We then curetted out the sinus  tract into the bone and then irrigated this area with 3 L of  normal saline solution using pulsatile lavage.  We then mixed some Stimulan antibiotic beads with vancomycin.  We were able to pack these into the sinus tract and fill this up completely.  We  then closed the deep tissue over this with 0 Vicryl, followed by 2-0 Vicryl in subcutaneous tissue and interrupted 2-0 nylon on the skin.  Xeroform and well-padded sterile dressing was applied.  She was awakened, extubated and taken to recovery room in  stable condition.  All final counts were correct.  There were no complications noted.   Postoperatively, we are going to admit her for IV antibiotics.  I will likely need to obtain further studies such as CT scan and an MRI of that knee.  My biggest  concern is that she may need an excision arthroplasty.  This will be discussed with her during this hospitalization as well.  VN/NUANCE  D:07/11/2019 T:07/12/2019 [id number]

## 2019-07-12 NOTE — Progress Notes (Signed)
  Subjective: Patient stable.  Pain controlled.  Patient has vancomycin ordered for IV antibiotics   Objective: Vital signs in last 24 hours: Temp:  [97.7 F (36.5 C)-99.9 F (37.7 C)] 97.9 F (36.6 C) (11/07 0624) Pulse Rate:  [67-104] 67 (11/07 0624) Resp:  [13-20] 16 (11/07 0624) BP: (109-136)/(56-87) 120/74 (11/07 0624) SpO2:  [98 %-100 %] 100 % (11/07 0624) Weight:  [68 kg] 68 kg (11/06 1248)  Intake/Output from previous day: 11/06 0701 - 11/07 0700 In: 2760 [P.O.:60; I.V.:2700] Out: 1800 [Urine:1700; Blood:100] Intake/Output this shift: No intake/output data recorded.  Exam:  Sensation intact distally Dorsiflexion/Plantar flexion intact  Labs: Recent Labs    07/12/19 0729  HGB 11.4*   Recent Labs    07/12/19 0729  WBC 5.5  RBC 4.07  HCT 37.3  PLT 278   Recent Labs    07/12/19 0729  NA 139  K 4.8  CL 103  CO2 28  BUN 8  CREATININE 0.72  GLUCOSE 122*  CALCIUM 9.0   No results for input(s): LABPT, INR in the last 72 hours.  Assessment/Plan: Plan at this time is vancomycin.  Also appears that MRI scan has been ordered.  Plan for IV antibiotics over the weekend and discharge planning per Dr. Quin Hoop 07/12/2019, 8:53 AM

## 2019-07-13 MED ORDER — VANCOMYCIN HCL IN DEXTROSE 1-5 GM/200ML-% IV SOLN
1000.0000 mg | Freq: Two times a day (BID) | INTRAVENOUS | Status: AC
  Administered 2019-07-13 (×2): 1000 mg via INTRAVENOUS
  Filled 2019-07-13 (×2): qty 200

## 2019-07-13 NOTE — Progress Notes (Signed)
  Subjective: [name] is a 68 y.o. female s/p placement of abx beads with vancomycin into proximal tibial wound.  They are POD2.  Pt's pain is controlled. Denies any fevers, chills, night sweats.  Notes she feels "run down".    Objective: Vital signs in last 24 hours: Temp:  [97.9 F (36.6 C)-98.3 F (36.8 C)] 98.1 F (36.7 C) (11/08 0533) Pulse Rate:  [71-85] 79 (11/08 0533) Resp:  [16-18] 16 (11/08 0533) BP: (104-125)/(61-80) 125/76 (11/08 0533) SpO2:  [98 %-100 %] 98 % (11/08 0533)  Intake/Output from previous day: 11/07 0701 - 11/08 0700 In: 1211.3 [P.O.:1080; I.V.:131.3] Out: 2200 [Urine:2200] Intake/Output this shift: No intake/output data recorded.  Exam:  No gross blood or drainage overlying the dressing 2+ DP pulse Sensation intact distally in the left foot Able to dorsiflex and plantarflex the left foot   Labs: Recent Labs    07/12/19 0729  HGB 11.4*   Recent Labs    07/12/19 0729  WBC 5.5  RBC 4.07  HCT 37.3  PLT 278   Recent Labs    07/12/19 0729  NA 139  K 4.8  CL 103  CO2 28  BUN 8  CREATININE 0.72  GLUCOSE 122*  CALCIUM 9.0   No results for input(s): LABPT, INR in the last 72 hours.  Assessment/Plan: Pt is POD2 s/p above procedure  -Plan for discharge per Dr. Ninfa Linden  -MRI of left knee is ordered but pending  -Ordered IV Vancomycin     [name] 07/13/2019, 7:46 AM

## 2019-07-13 NOTE — Anesthesia Postprocedure Evaluation (Signed)
Anesthesia Post Note  Patient: [name]  Procedure(s) Performed: IRRIGATION AND DEBRIDEMENT LEFT KNEE,PLACEMENT OF STIMULAN BEADS (Left ) ARTHROSCOPY KNEE (Left Knee)     Patient location during evaluation: PACU Anesthesia Type: General Level of consciousness: awake and alert Pain management: pain level controlled Vital Signs Assessment: post-procedure vital signs reviewed and stable Respiratory status: spontaneous breathing, nonlabored ventilation, respiratory function stable and patient connected to nasal cannula oxygen Cardiovascular status: blood pressure returned to baseline and stable Postop Assessment: no apparent nausea or vomiting Anesthetic complications: no    Last Vitals:  Vitals:   07/12/19 2209 07/13/19 0533  BP: 114/80 125/76  Pulse: 76 79  Resp: 18 16  Temp: 36.6 C 36.7 C  SpO2: 99% 98%    Last Pain:  Vitals:   07/13/19 0533  TempSrc: Oral  PainSc:                  [place]

## 2019-07-14 ENCOUNTER — Ambulatory Visit: Payer: Medicare HMO | Admitting: Orthopaedic Surgery

## 2019-07-14 ENCOUNTER — Encounter (HOSPITAL_COMMUNITY): Payer: Self-pay | Admitting: Orthopaedic Surgery

## 2019-07-14 ENCOUNTER — Encounter: Payer: Self-pay | Admitting: Orthopaedic Surgery

## 2019-07-14 ENCOUNTER — Other Ambulatory Visit: Payer: Self-pay

## 2019-07-14 DIAGNOSIS — Z96642 Presence of left artificial hip joint: Secondary | ICD-10-CM

## 2019-07-14 LAB — SEDIMENTATION RATE: Sed Rate: 43 mm/h — ABNORMAL HIGH (ref 0–30)

## 2019-07-14 LAB — SYNOVIAL CELL COUNT + DIFF, W/ CRYSTALS
Basophils, %: 0 %
Eosinophils-Synovial: 0 % (ref 0–2)
Lymphocytes-Synovial Fld: 4 % (ref 0–74)
Monocyte/Macrophage: 0 % (ref 0–69)
Neutrophil, Synovial: 96 % — ABNORMAL HIGH (ref 0–24)
Synoviocytes, %: 0 % (ref 0–15)
WBC, Synovial: 1642 cells/uL — ABNORMAL HIGH (ref <150)

## 2019-07-14 LAB — CBC WITH DIFFERENTIAL/PLATELET
Absolute Monocytes: 466 cells/uL (ref 200–950)
Basophils Absolute: 69 cells/uL (ref 0–200)
Basophils Relative: 1.4 %
Eosinophils Absolute: 103 cells/uL (ref 15–500)
Eosinophils Relative: 2.1 %
HCT: 40.2 % (ref 35.0–45.0)
Hemoglobin: 13.4 g/dL (ref 11.7–15.5)
Lymphs Abs: 1201 cells/uL (ref 850–3900)
MCH: 28.1 pg (ref 27.0–33.0)
MCHC: 33.3 g/dL (ref 32.0–36.0)
MCV: 84.3 fL (ref 80.0–100.0)
MPV: 9.7 fL (ref 7.5–12.5)
Monocytes Relative: 9.5 %
Neutro Abs: 3063 cells/uL (ref 1500–7800)
Neutrophils Relative %: 62.5 %
Platelets: 371 10*3/uL (ref 140–400)
RBC: 4.77 10*6/uL (ref 3.80–5.10)
RDW: 12.9 % (ref 11.0–15.0)
Total Lymphocyte: 24.5 %
WBC: 4.9 10*3/uL (ref 3.8–10.8)

## 2019-07-14 LAB — TEST AUTHORIZATION

## 2019-07-14 LAB — GRAM STAIN
MICRO NUMBER:: 1060022
SPECIMEN QUALITY:: ADEQUATE

## 2019-07-14 LAB — ANAEROBIC AND AEROBIC CULTURE
AER RESULT:: NO GROWTH
GRAM STAIN:: NONE SEEN
MICRO NUMBER:: 1063873
MICRO NUMBER:: 1063874
SPECIMEN QUALITY:: ADEQUATE
SPECIMEN QUALITY:: ADEQUATE

## 2019-07-14 LAB — C-REACTIVE PROTEIN: CRP: 16.3 mg/L — ABNORMAL HIGH (ref <8.0)

## 2019-07-14 MED ORDER — OXYCODONE HCL 5 MG PO TABS: 5.0000 mg | ORAL_TABLET | Freq: Four times a day (QID) | ORAL | 0 refills | Status: AC | PRN

## 2019-07-14 NOTE — Plan of Care (Signed)
  Problem: Health Behavior/Discharge Planning: Goal: Ability to manage health-related needs will improve Outcome: Adequate for Discharge   Problem: Clinical Measurements: Goal: Ability to maintain clinical measurements within normal limits will improve Outcome: Adequate for Discharge Goal: Will remain free from infection Outcome: Adequate for Discharge Goal: Diagnostic test results will improve Outcome: Adequate for Discharge   Problem: Activity: Goal: Risk for activity intolerance will decrease Outcome: Adequate for Discharge   Problem: Elimination: Goal: Will not experience complications related to bowel motility Outcome: Adequate for Discharge   Problem: Pain Managment: Goal: General experience of comfort will improve Outcome: Adequate for Discharge

## 2019-07-14 NOTE — Discharge Summary (Signed)
Patient ID: [name] MRN: [id number] DOB/AGE: 1951/07/09 68 y.o.  Admit date: 07/11/2019 Discharge date: 07/14/2019  Admission Diagnoses:  Principal Problem:   Cellulitis of left knee Active Problems:   Infection of left knee [facility])   Discharge Diagnoses:  Same  Past Medical History:  Diagnosis Date  . Arthritis    osteoarthritis knees mostly  . Chest pain    a. 08/2017 MV: mid-basilar anteroseptal ischemia;  b. 08/2017 Cath: LM nl, LAD 24m, D2 30, RI nl, LCX 20ost, RCA large, nl, EF 65%.  . Diastolic dysfunction    a. 08/2017 Echo: EF 60-65%, no rwma, Gr1 DD.  Marland Kitchen Hypokalemia    takes losartan for this    Surgeries: Procedure(s): IRRIGATION AND DEBRIDEMENT LEFT KNEE,PLACEMENT OF STIMULAN BEADS ARTHROSCOPY KNEE on 07/11/2019   Consultants:   Discharged Condition: Improved  Hospital Course: [name] is an 68 y.o. female who was admitted 07/11/2019 for operative treatment ofCellulitis of left knee. Patient has severe unremitting pain that affects sleep, daily activities, and work/hobbies. After pre-op clearance the patient was taken to the operating room on 07/11/2019 and underwent  Procedure(s): IRRIGATION AND DEBRIDEMENT LEFT KNEE,PLACEMENT OF STIMULAN BEADS ARTHROSCOPY KNEE.    Patient was given perioperative antibiotics:  Anti-infectives (From admission, onward)   Start     Dose/Rate Route Frequency Ordered Stop   07/13/19 0900  vancomycin (VANCOCIN) IVPB 1000 mg/200 mL premix     1,000 mg 200 mL/hr over 60 Minutes Intravenous Every 12 hours 07/13/19 0752 07/13/19 2318   07/11/19 1640  vancomycin (VANCOCIN) powder  Status:  Discontinued       As needed 07/11/19 1650 07/11/19 1850   07/11/19 1400  vancomycin (VANCOCIN) IVPB 1000 mg/200 mL premix     1,000 mg 200 mL/hr over 60 Minutes Intravenous  Once 07/11/19 1345 07/11/19 1722       Patient was given sequential compression devices, early ambulation, and chemoprophylaxis to prevent DVT.  Patient  benefited maximally from hospital stay and there were no complications.    Recent vital signs:  Patient Vitals for the past 24 hrs:  BP Temp Temp src Pulse Resp SpO2  07/14/19 0553 123/68 97.9 F (36.6 C) Oral 70 18 97 %  07/13/19 2134 113/68 98.2 F (36.8 C) Oral 76 18 100 %  07/13/19 1342 108/63 98.3 F (36.8 C) Oral 75 17 99 %     Recent laboratory studies:  Recent Labs    07/12/19 0729  WBC 5.5  HGB 11.4*  HCT 37.3  PLT 278  NA 139  K 4.8  CL 103  CO2 28  BUN 8  CREATININE 0.72  GLUCOSE 122*  CALCIUM 9.0     Discharge Medications:   Allergies as of 07/14/2019      Reactions   Penicillins Anaphylaxis, Swelling   Did it involve swelling of the face/tongue/throat, SOB, or low BP? Yes Did it involve sudden or severe rash/hives, skin peeling, or any reaction on the inside of your mouth or nose? No Did you need to seek medical attention at a hospital or doctor's office? Yes When did it last happen?40 + years If all above answers are "NO", may proceed with cephalosporin use.   Tetracyclines & Related Hives      Medication List    TAKE these medications   acetaminophen 500 MG tablet Commonly known as: TYLENOL Take 1,000 mg by mouth every 6 (six) hours as needed for moderate pain or headache.   Dialyvite Vitamin D 5000  125 MCG (5000 UT) capsule Generic drug: Cholecalciferol Take 5,000 Units by mouth daily.   DULoxetine 60 MG capsule Commonly known as: CYMBALTA Take 60 mg by mouth 2 (two) times daily.   losartan 25 MG tablet Commonly known as: COZAAR TAKE ONE TABLET BY MOUTH DAILY   methocarbamol 500 MG tablet Commonly known as: ROBAXIN Take 1 tablet (500 mg total) by mouth every 6 (six) hours as needed for muscle spasms.   nitroGLYCERIN 0.4 MG SL tablet Commonly known as: NITROSTAT Place 1 tablet (0.4 mg total) under the tongue every 5 (five) minutes x 3 doses as needed for chest pain.   omeprazole 40 MG capsule Commonly known as: PRILOSEC Take  20 mg by mouth daily.   oxyCODONE 5 MG immediate release tablet Commonly known as: Oxy IR/ROXICODONE Take 1-2 tablets (5-10 mg total) by mouth every 6 (six) hours as needed for moderate pain (pain score 4-6). What changed: how much to take   simvastatin 20 MG tablet Commonly known as: ZOCOR Take 20 mg by mouth every evening.   TURMERIC PO Take 1 capsule by mouth at bedtime.       Diagnostic Studies: Dg Knee Left Port  Result Date: 07/11/2019 CLINICAL DATA:  Status post I and D EXAM: PORTABLE LEFT KNEE - 1-2 VIEW COMPARISON:  February 24, 2019 FINDINGS: The patient is status post total knee arthroplasty. No periprosthetic fracture is seen. Antibiotic beads is seen over the tibia-fibular articulation. There is a moderate knee joint effusion and prepatellar soft tissue swelling. 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 Antibiotic beads seen overlying the tibia-fibular articulation. Electronically Signed   By: Prudencio Pair M.D.   On: 07/11/2019 19:29    Disposition: Discharge disposition: 01-Home or [place]    Mcarthur Rossetti, MD Follow up in 2 week(s).   Specialty: Orthopedic Surgery Why: Follow-up at [place] information: [place] [address] [phone]            Signed: Mcarthur Rossetti 07/14/2019, 7:23 AM

## 2019-07-14 NOTE — Progress Notes (Signed)
Patient ID: [name], female   DOB: [birth year], 68 y.o.   MRN: [id number] The patient looks better overall.  There was no acute changes over the weekend.  The fluid taken from her left knee joint as well as from the left lateral tibia have shown no organisms on Gram stain and the cultures have not grown out anything.  Her white blood cell count peripherally remains normal.  Her vitals are stable.  Her CRP even came down to almost normal.  She has been on vancomycin all weekend long.  Her incision looks good and there is no drainage.  There is no redness.  She does have a chronic knee swelling that she has had but she is able to bend her knee back and forth and appears well overall.  I want to discharge her home today on her oral clindamycin.  We will work on obtaining an MRI of her knee as an outpatient.  I am still uncertain as to where the or not we would recommend an excision arthroplasty.  We will continue to work with her closely as an outpatient.

## 2019-07-14 NOTE — Discharge Instructions (Signed)
Do increase her activities as comfort allows except for no exercising. The dressing that is currently on your knee can get wet in the shower daily. Dressing can be changed every 5 days as needed.

## 2019-07-15 ENCOUNTER — Encounter (HOSPITAL_COMMUNITY): Payer: Self-pay | Admitting: Orthopaedic Surgery

## 2019-07-15 DIAGNOSIS — G5782 Other specified mononeuropathies of left lower limb: Secondary | ICD-10-CM | POA: Diagnosis not present

## 2019-07-15 DIAGNOSIS — M1712 Unilateral primary osteoarthritis, left knee: Secondary | ICD-10-CM | POA: Diagnosis not present

## 2019-07-15 DIAGNOSIS — G894 Chronic pain syndrome: Secondary | ICD-10-CM | POA: Diagnosis not present

## 2019-07-17 LAB — AEROBIC/ANAEROBIC CULTURE W GRAM STAIN (SURGICAL/DEEP WOUND)
Culture: NO GROWTH
Culture: NO GROWTH
Gram Stain: NONE SEEN

## 2019-07-23 ENCOUNTER — Ambulatory Visit (INDEPENDENT_AMBULATORY_CARE_PROVIDER_SITE_OTHER): Payer: Medicare HMO | Admitting: Orthopaedic Surgery

## 2019-07-23 ENCOUNTER — Encounter: Payer: Self-pay | Admitting: Orthopaedic Surgery

## 2019-07-23 ENCOUNTER — Other Ambulatory Visit: Payer: Self-pay

## 2019-07-23 DIAGNOSIS — Z96652 Presence of left artificial knee joint: Secondary | ICD-10-CM

## 2019-07-23 DIAGNOSIS — L03116 Cellulitis of left lower limb: Secondary | ICD-10-CM

## 2019-07-23 NOTE — Progress Notes (Signed)
The patient is a 67 year old female who is following up 12 days status post irrigation debridement of the left knee at the lateral aspect.  This was over an area where she had ligamentous reconstruction of her knee over 30 years ago.  The cultures did not grow out any infection and these included cultures from our office and interoperative cultures.  There was only a small number of white blood cells.  Intraoperatively I did find a tunnel from where she had likely a lateral collateral ligament reconstruction.  I did not dissect around the previous implant there is a large staple type of implant from her ligamentous reconstruction.  I still placed antibiotic stimulant beads in the tunnel.  She still has chronic left knee swelling but has been going on for many years even before her left total knee arthroplasty.  I also aspirated fluid from her knee joint at the time of surgery and this showed minimal white blood cells and no organisms.  She still dealing with chronic pain with her left knee.  She denies any fever, chills, nausea, vomiting.  She is walking without assistive device.  She is a very athletic 68 year old.  She rides an exercise bike and plays tennis regularly.  On examination of her left knee the suture line looks good on the lateral aspect of her knee and I remove the sutures in place Steri-Strips.  There is no evidence of cellulitis or fluctuance.  Her left knee itself still remains swollen.  Her calf is soft.  Again, her initial total knee arthroplasty was done in 2017 by one of my colleagues in town.  He actually performed bilateral total knee arthroplasties in the same setting.  Her right knee has never had issues.  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  She continued to have knee swelling in that knee over the last 3 years and then earlier this year was aggressively riding her exercise bike when she  felt a pop in her knee.  It was more swollen and tender after that.  My colleague was unable to aspirate any fluid from the knee.  She then sought a second opinion from me.  In my office I aspirated fluid from her left knee and sent this off for Gram stain and culture which was negative but it did have 17,000 white cells.  I explained to her that this can be indicative of an infection so I took her to the operating room and performed an open synovectomy and polyliner exchange.  We did not find any loosening of the implants and there was no purulence in the knee or any gross evidence of infection.  Those cultures were negative.  She continues to have pain and swelling with that knee.  I am concerned that there may be an indolent infection that is causing her this type of pain.  I would like to send her for another opinion to [place], [place] to [facility] with Dr. Reynolds Bowl for further evaluation treatment of this issue.  She agrees with this referral as well.

## 2019-07-24 ENCOUNTER — Encounter: Payer: Self-pay | Admitting: Orthopaedic Surgery

## 2019-07-24 ENCOUNTER — Other Ambulatory Visit: Payer: Self-pay | Admitting: Radiology

## 2019-07-24 ENCOUNTER — Other Ambulatory Visit: Payer: Self-pay

## 2019-07-24 DIAGNOSIS — M009 Pyogenic arthritis, unspecified: Secondary | ICD-10-CM

## 2019-08-01 ENCOUNTER — Encounter: Payer: Self-pay | Admitting: Orthopaedic Surgery

## 2019-08-04 ENCOUNTER — Ambulatory Visit
Admission: RE | Admit: 2019-08-04 | Discharge: 2019-08-04 | Disposition: A | Discharge status: Home / Self Care | Payer: Medicare HMO | Source: Ambulatory Visit | Attending: Orthopaedic Surgery | Admitting: Orthopaedic Surgery

## 2019-08-04 DIAGNOSIS — Z96642 Presence of left artificial hip joint: Secondary | ICD-10-CM

## 2019-08-04 DIAGNOSIS — M25562 Pain in left knee: Secondary | ICD-10-CM | POA: Diagnosis not present

## 2019-08-05 ENCOUNTER — Other Ambulatory Visit: Payer: Self-pay | Admitting: Physician Assistant

## 2019-08-05 ENCOUNTER — Encounter: Payer: Self-pay | Admitting: Orthopaedic Surgery

## 2019-08-05 NOTE — Telephone Encounter (Signed)
Ok to refill 

## 2019-08-06 ENCOUNTER — Other Ambulatory Visit: Payer: Medicare HMO

## 2019-08-06 NOTE — Telephone Encounter (Signed)
Left voicemail requesting fax number: called [phone]

## 2019-08-07 DIAGNOSIS — M25562 Pain in left knee: Secondary | ICD-10-CM | POA: Diagnosis not present

## 2019-08-12 DIAGNOSIS — G5782 Other specified mononeuropathies of left lower limb: Secondary | ICD-10-CM | POA: Diagnosis not present

## 2019-08-12 DIAGNOSIS — G894 Chronic pain syndrome: Secondary | ICD-10-CM | POA: Diagnosis not present

## 2019-08-12 DIAGNOSIS — M792 Neuralgia and neuritis, unspecified: Secondary | ICD-10-CM | POA: Diagnosis not present

## 2019-08-12 DIAGNOSIS — M1712 Unilateral primary osteoarthritis, left knee: Secondary | ICD-10-CM | POA: Diagnosis not present

## 2019-08-16 ENCOUNTER — Encounter: Payer: Self-pay | Admitting: Orthopaedic Surgery

## 2019-08-18 DIAGNOSIS — H2513 Age-related nuclear cataract, bilateral: Secondary | ICD-10-CM | POA: Diagnosis not present

## 2019-08-18 DIAGNOSIS — H40013 Open angle with borderline findings, low risk, bilateral: Secondary | ICD-10-CM | POA: Diagnosis not present

## 2019-09-03 ENCOUNTER — Ambulatory Visit (HOSPITAL_COMMUNITY)
Admission: RE | Admit: 2019-09-03 | Discharge: 2019-09-03 | Disposition: A | Discharge status: Home / Self Care | Payer: Medicare HMO | Source: Ambulatory Visit | Attending: Internal Medicine | Admitting: Internal Medicine

## 2019-09-03 ENCOUNTER — Other Ambulatory Visit: Payer: Self-pay

## 2019-09-03 ENCOUNTER — Other Ambulatory Visit: Payer: Self-pay | Admitting: Internal Medicine

## 2019-09-03 DIAGNOSIS — S0990XA Unspecified injury of head, initial encounter: Secondary | ICD-10-CM | POA: Diagnosis not present

## 2019-09-03 DIAGNOSIS — R519 Headache, unspecified: Secondary | ICD-10-CM | POA: Diagnosis not present

## 2019-09-10 ENCOUNTER — Ambulatory Visit: Payer: Medicare HMO | Admitting: Orthopaedic Surgery

## 2019-09-16 DIAGNOSIS — Z01818 Encounter for other preprocedural examination: Secondary | ICD-10-CM | POA: Diagnosis not present

## 2019-09-16 DIAGNOSIS — M25562 Pain in left knee: Secondary | ICD-10-CM | POA: Diagnosis not present

## 2019-09-26 ENCOUNTER — Ambulatory Visit: Payer: Medicare HMO | Attending: Internal Medicine

## 2019-09-26 DIAGNOSIS — Z01419 Encounter for gynecological examination (general) (routine) without abnormal findings: Secondary | ICD-10-CM | POA: Diagnosis not present

## 2019-09-26 DIAGNOSIS — Z23 Encounter for immunization: Secondary | ICD-10-CM

## 2019-09-26 NOTE — Progress Notes (Signed)
   Covid-19 Vaccination Clinic  Name:  [name]    MRN: [id number] DOB: [birth year]  09/26/2019  [name] was observed post Covid-19 immunization for 15 minutes without incidence. She was provided with Vaccine Information Sheet and instruction to access the V-Safe system.   [name] was instructed to call 911 with any severe reactions post vaccine: Marland Kitchen Difficulty breathing  . Swelling of your face and throat  . A fast heartbeat  . A bad rash all over your body  . Dizziness and weakness    Immunizations Administered    Name Date Dose VIS Date Route   Pfizer COVID-19 Vaccine 09/26/2019  9:04 AM 0.3 mL 08/15/2019 Intramuscular   Manufacturer: [place]   Lot: [id number]   [place]: [id number]

## 2019-10-05 IMAGING — DX DG CHEST 1V PORT
1 series · 1 of 1 positions shown · non-contrast
Comparison: Chest CT dated 08/21/2017

CLINICAL DATA: 66-year-old female with chest pain.

EXAM:
PORTABLE CHEST 1 VIEW

[chest ap]
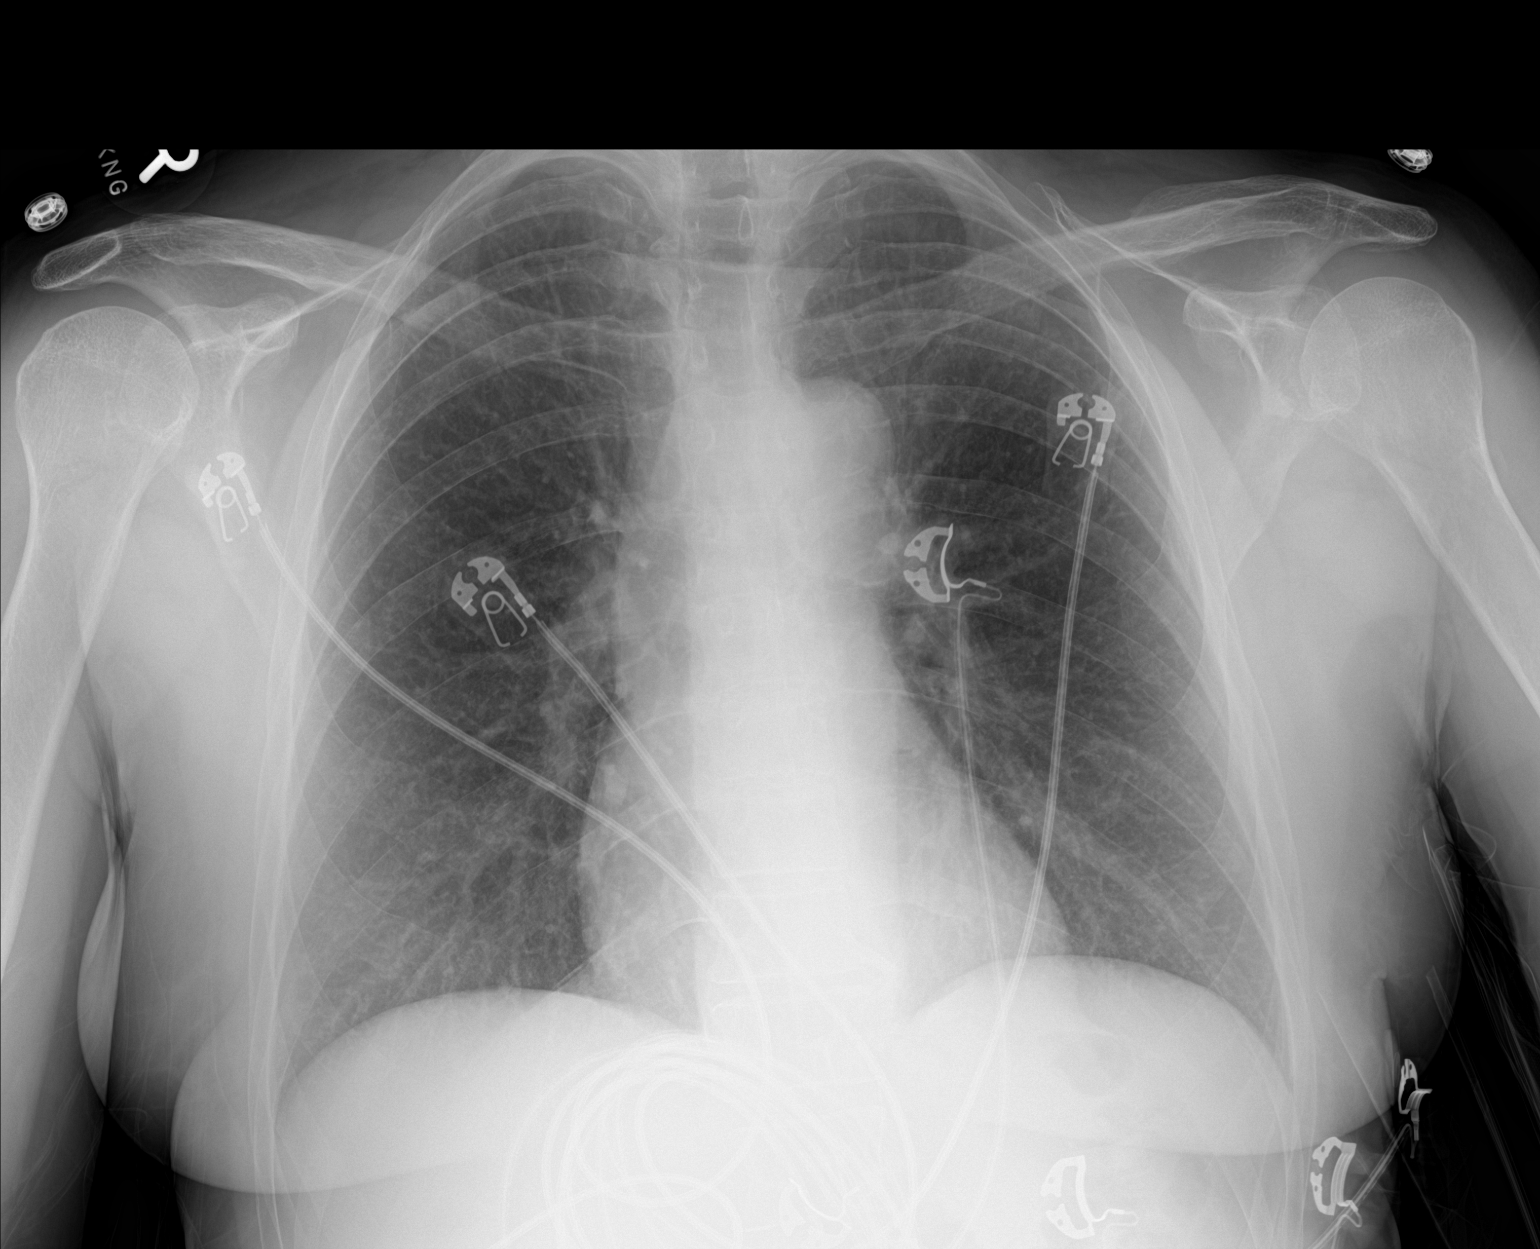

[1 of 1 positions shown; findings below may reference images not displayed]

FINDINGS: The heart size and mediastinal contours are within normal limits.
Both lungs are clear. The visualized skeletal structures are
unremarkable.
IMPRESSION: No active disease.

## 2019-10-06 DIAGNOSIS — Z01818 Encounter for other preprocedural examination: Secondary | ICD-10-CM | POA: Diagnosis not present

## 2019-10-06 DIAGNOSIS — M25569 Pain in unspecified knee: Secondary | ICD-10-CM | POA: Diagnosis not present

## 2019-10-06 DIAGNOSIS — E785 Hyperlipidemia, unspecified: Secondary | ICD-10-CM | POA: Diagnosis not present

## 2019-10-07 DIAGNOSIS — Z79899 Other long term (current) drug therapy: Secondary | ICD-10-CM | POA: Diagnosis not present

## 2019-10-07 DIAGNOSIS — M25562 Pain in left knee: Secondary | ICD-10-CM | POA: Diagnosis not present

## 2019-10-07 DIAGNOSIS — M792 Neuralgia and neuritis, unspecified: Secondary | ICD-10-CM | POA: Diagnosis not present

## 2019-10-07 DIAGNOSIS — Z5181 Encounter for therapeutic drug level monitoring: Secondary | ICD-10-CM | POA: Diagnosis not present

## 2019-10-07 DIAGNOSIS — G894 Chronic pain syndrome: Secondary | ICD-10-CM | POA: Diagnosis not present

## 2019-10-07 DIAGNOSIS — G5780 Other specified mononeuropathies of unspecified lower limb: Secondary | ICD-10-CM | POA: Diagnosis not present

## 2019-10-10 DIAGNOSIS — Z01812 Encounter for preprocedural laboratory examination: Secondary | ICD-10-CM | POA: Diagnosis not present

## 2019-10-10 DIAGNOSIS — Z20822 Contact with and (suspected) exposure to covid-19: Secondary | ICD-10-CM | POA: Diagnosis not present

## 2019-10-17 DIAGNOSIS — E785 Hyperlipidemia, unspecified: Secondary | ICD-10-CM | POA: Diagnosis not present

## 2019-10-17 DIAGNOSIS — Z880 Allergy status to penicillin: Secondary | ICD-10-CM | POA: Diagnosis not present

## 2019-10-17 DIAGNOSIS — I82409 Acute embolism and thrombosis of unspecified deep veins of unspecified lower extremity: Secondary | ICD-10-CM | POA: Diagnosis not present

## 2019-10-17 DIAGNOSIS — Z452 Encounter for adjustment and management of vascular access device: Secondary | ICD-10-CM | POA: Diagnosis not present

## 2019-10-17 DIAGNOSIS — T8454XA Infection and inflammatory reaction due to internal left knee prosthesis, initial encounter: Secondary | ICD-10-CM | POA: Diagnosis not present

## 2019-10-17 DIAGNOSIS — D62 Acute posthemorrhagic anemia: Secondary | ICD-10-CM | POA: Diagnosis not present

## 2019-10-17 DIAGNOSIS — I82442 Acute embolism and thrombosis of left tibial vein: Secondary | ICD-10-CM | POA: Diagnosis not present

## 2019-10-17 DIAGNOSIS — M65862 Other synovitis and tenosynovitis, left lower leg: Secondary | ICD-10-CM | POA: Diagnosis not present

## 2019-10-17 DIAGNOSIS — K219 Gastro-esophageal reflux disease without esophagitis: Secondary | ICD-10-CM | POA: Diagnosis not present

## 2019-10-17 DIAGNOSIS — Z96652 Presence of left artificial knee joint: Secondary | ICD-10-CM | POA: Diagnosis not present

## 2019-10-17 DIAGNOSIS — Z471 Aftercare following joint replacement surgery: Secondary | ICD-10-CM | POA: Diagnosis not present

## 2019-10-17 DIAGNOSIS — E861 Hypovolemia: Secondary | ICD-10-CM | POA: Diagnosis not present

## 2019-10-17 DIAGNOSIS — K5900 Constipation, unspecified: Secondary | ICD-10-CM | POA: Diagnosis not present

## 2019-10-17 DIAGNOSIS — D649 Anemia, unspecified: Secondary | ICD-10-CM | POA: Diagnosis not present

## 2019-10-17 DIAGNOSIS — Y831 Surgical operation with implant of artificial internal device as the cause of abnormal reaction of the patient, or of later complication, without mention of misadventure at the time of the procedure: Secondary | ICD-10-CM | POA: Diagnosis not present

## 2019-10-17 DIAGNOSIS — B957 Other staphylococcus as the cause of diseases classified elsewhere: Secondary | ICD-10-CM | POA: Diagnosis not present

## 2019-10-17 DIAGNOSIS — Z01810 Encounter for preprocedural cardiovascular examination: Secondary | ICD-10-CM | POA: Diagnosis not present

## 2019-10-22 DIAGNOSIS — T8454XA Infection and inflammatory reaction due to internal left knee prosthesis, initial encounter: Secondary | ICD-10-CM | POA: Diagnosis not present

## 2019-10-23 ENCOUNTER — Ambulatory Visit: Payer: Medicare HMO | Admitting: Orthopaedic Surgery

## 2019-10-23 DIAGNOSIS — T8454XA Infection and inflammatory reaction due to internal left knee prosthesis, initial encounter: Secondary | ICD-10-CM | POA: Diagnosis not present

## 2019-10-24 ENCOUNTER — Ambulatory Visit: Payer: Medicare HMO

## 2019-10-24 DIAGNOSIS — T8454XA Infection and inflammatory reaction due to internal left knee prosthesis, initial encounter: Secondary | ICD-10-CM | POA: Diagnosis not present

## 2019-10-25 ENCOUNTER — Ambulatory Visit: Payer: Medicare HMO | Attending: Internal Medicine

## 2019-10-25 DIAGNOSIS — T8454XA Infection and inflammatory reaction due to internal left knee prosthesis, initial encounter: Secondary | ICD-10-CM | POA: Diagnosis not present

## 2019-10-25 DIAGNOSIS — Z23 Encounter for immunization: Secondary | ICD-10-CM | POA: Insufficient documentation

## 2019-10-25 NOTE — Progress Notes (Signed)
   Covid-19 Vaccination Clinic  Name:  [name]    MRN: [id number] DOB: 07-Oct-1950  10/25/2019  [name] was observed post Covid-19 immunization for 15 minutes without incidence. She was provided with Vaccine Information Sheet and instruction to access the V-Safe system.   [name] was instructed to call 911 with any severe reactions post vaccine: Marland Kitchen Difficulty breathing  . Swelling of your face and throat  . A fast heartbeat  . A bad rash all over your body  . Dizziness and weakness    Immunizations Administered    Name Date Dose VIS Date Route   Pfizer COVID-19 Vaccine 10/25/2019  8:40 AM 0.3 mL 08/15/2019 Intramuscular   Manufacturer: [place]   Lot: [id number]   [place]: [id number]

## 2019-10-26 DIAGNOSIS — T8454XA Infection and inflammatory reaction due to internal left knee prosthesis, initial encounter: Secondary | ICD-10-CM | POA: Diagnosis not present

## 2019-10-27 DIAGNOSIS — T8454XA Infection and inflammatory reaction due to internal left knee prosthesis, initial encounter: Secondary | ICD-10-CM | POA: Diagnosis not present

## 2019-10-28 DIAGNOSIS — T8454XA Infection and inflammatory reaction due to internal left knee prosthesis, initial encounter: Secondary | ICD-10-CM | POA: Diagnosis not present

## 2019-10-29 DIAGNOSIS — T8454XA Infection and inflammatory reaction due to internal left knee prosthesis, initial encounter: Secondary | ICD-10-CM | POA: Diagnosis not present

## 2019-10-30 DIAGNOSIS — T8454XA Infection and inflammatory reaction due to internal left knee prosthesis, initial encounter: Secondary | ICD-10-CM | POA: Diagnosis not present

## 2019-10-31 DIAGNOSIS — T8454XA Infection and inflammatory reaction due to internal left knee prosthesis, initial encounter: Secondary | ICD-10-CM | POA: Diagnosis not present

## 2019-11-01 DIAGNOSIS — T8454XA Infection and inflammatory reaction due to internal left knee prosthesis, initial encounter: Secondary | ICD-10-CM | POA: Diagnosis not present

## 2019-11-02 DIAGNOSIS — T8454XA Infection and inflammatory reaction due to internal left knee prosthesis, initial encounter: Secondary | ICD-10-CM | POA: Diagnosis not present

## 2019-11-03 DIAGNOSIS — M1712 Unilateral primary osteoarthritis, left knee: Secondary | ICD-10-CM | POA: Diagnosis not present

## 2019-11-03 DIAGNOSIS — M25662 Stiffness of left knee, not elsewhere classified: Secondary | ICD-10-CM | POA: Diagnosis not present

## 2019-11-03 DIAGNOSIS — M6281 Muscle weakness (generalized): Secondary | ICD-10-CM | POA: Diagnosis not present

## 2019-11-03 DIAGNOSIS — T8454XA Infection and inflammatory reaction due to internal left knee prosthesis, initial encounter: Secondary | ICD-10-CM | POA: Diagnosis not present

## 2019-11-03 DIAGNOSIS — R262 Difficulty in walking, not elsewhere classified: Secondary | ICD-10-CM | POA: Diagnosis not present

## 2019-11-04 DIAGNOSIS — Z792 Long term (current) use of antibiotics: Secondary | ICD-10-CM | POA: Diagnosis not present

## 2019-11-04 DIAGNOSIS — Z452 Encounter for adjustment and management of vascular access device: Secondary | ICD-10-CM | POA: Diagnosis not present

## 2019-11-04 DIAGNOSIS — T8454XA Infection and inflammatory reaction due to internal left knee prosthesis, initial encounter: Secondary | ICD-10-CM | POA: Diagnosis not present

## 2019-11-04 DIAGNOSIS — R600 Localized edema: Secondary | ICD-10-CM | POA: Diagnosis not present

## 2019-11-04 DIAGNOSIS — Z0389 Encounter for observation for other suspected diseases and conditions ruled out: Secondary | ICD-10-CM | POA: Diagnosis not present

## 2019-11-05 DIAGNOSIS — T8454XA Infection and inflammatory reaction due to internal left knee prosthesis, initial encounter: Secondary | ICD-10-CM | POA: Diagnosis not present

## 2019-11-06 ENCOUNTER — Ambulatory Visit: Payer: Medicare HMO | Admitting: Allergy

## 2019-11-06 DIAGNOSIS — T8454XA Infection and inflammatory reaction due to internal left knee prosthesis, initial encounter: Secondary | ICD-10-CM | POA: Diagnosis not present

## 2019-11-07 DIAGNOSIS — M25662 Stiffness of left knee, not elsewhere classified: Secondary | ICD-10-CM | POA: Diagnosis not present

## 2019-11-07 DIAGNOSIS — M1712 Unilateral primary osteoarthritis, left knee: Secondary | ICD-10-CM | POA: Diagnosis not present

## 2019-11-07 DIAGNOSIS — R262 Difficulty in walking, not elsewhere classified: Secondary | ICD-10-CM | POA: Diagnosis not present

## 2019-11-07 DIAGNOSIS — T8454XA Infection and inflammatory reaction due to internal left knee prosthesis, initial encounter: Secondary | ICD-10-CM | POA: Diagnosis not present

## 2019-11-07 DIAGNOSIS — M6281 Muscle weakness (generalized): Secondary | ICD-10-CM | POA: Diagnosis not present

## 2019-11-08 DIAGNOSIS — T8454XA Infection and inflammatory reaction due to internal left knee prosthesis, initial encounter: Secondary | ICD-10-CM | POA: Diagnosis not present

## 2019-11-09 DIAGNOSIS — T8454XA Infection and inflammatory reaction due to internal left knee prosthesis, initial encounter: Secondary | ICD-10-CM | POA: Diagnosis not present

## 2019-11-10 DIAGNOSIS — M6281 Muscle weakness (generalized): Secondary | ICD-10-CM | POA: Diagnosis not present

## 2019-11-10 DIAGNOSIS — M25662 Stiffness of left knee, not elsewhere classified: Secondary | ICD-10-CM | POA: Diagnosis not present

## 2019-11-10 DIAGNOSIS — R262 Difficulty in walking, not elsewhere classified: Secondary | ICD-10-CM | POA: Diagnosis not present

## 2019-11-10 DIAGNOSIS — M1712 Unilateral primary osteoarthritis, left knee: Secondary | ICD-10-CM | POA: Diagnosis not present

## 2019-11-10 DIAGNOSIS — T8454XA Infection and inflammatory reaction due to internal left knee prosthesis, initial encounter: Secondary | ICD-10-CM | POA: Diagnosis not present

## 2019-11-11 DIAGNOSIS — Z471 Aftercare following joint replacement surgery: Secondary | ICD-10-CM | POA: Diagnosis not present

## 2019-11-11 DIAGNOSIS — T8454XA Infection and inflammatory reaction due to internal left knee prosthesis, initial encounter: Secondary | ICD-10-CM | POA: Diagnosis not present

## 2019-11-12 DIAGNOSIS — T8454XA Infection and inflammatory reaction due to internal left knee prosthesis, initial encounter: Secondary | ICD-10-CM | POA: Diagnosis not present

## 2019-11-13 ENCOUNTER — Other Ambulatory Visit: Payer: Self-pay

## 2019-11-13 ENCOUNTER — Ambulatory Visit: Payer: Medicare HMO | Admitting: Allergy

## 2019-11-13 ENCOUNTER — Encounter: Payer: Self-pay | Admitting: Allergy

## 2019-11-13 VITALS — BP 116/72 | HR 99 | Temp 97.7°F | Resp 16 | Ht 67.0 in | Wt 152.5 lb

## 2019-11-13 DIAGNOSIS — L282 Other prurigo: Secondary | ICD-10-CM | POA: Diagnosis not present

## 2019-11-13 DIAGNOSIS — T8454XA Infection and inflammatory reaction due to internal left knee prosthesis, initial encounter: Secondary | ICD-10-CM | POA: Diagnosis not present

## 2019-11-13 DIAGNOSIS — Z881 Allergy status to other antibiotic agents status: Secondary | ICD-10-CM

## 2019-11-13 MED ORDER — TRIAMCINOLONE ACETONIDE 0.1 % EX OINT: TOPICAL_OINTMENT | CUTANEOUS | 0 refills | Status: AC

## 2019-11-13 NOTE — Patient Instructions (Addendum)
.   Get bloodwork:  o We are ordering labs, so please allow 1-2 weeks for the results to come back. o With the newly implemented Cures Act, the labs might be visible to you at the same time that they become visible to me. However, I will not address the results until all of the results are back, so please be patient.   Start zyrtec 10mg  twice a day to help with the itching/rash.  May take benadryl 25mg  to 50mg  additionally every 6-8 hours as needed for the itching/rash.  May use topical triamcinolone ointment on the rash and see if it helps with the itching/rash.  Start proper skin care as below.  I will talk to your infectious disease physician and discuss what are the next steps.   Schedule for the penicillin skin testing next week either ways. You have to be off allergy medications for 3 days before. If you are still having issues with the itching and can't wean off the medications then let us know.  We can do an oral drug challenge in the office if needed but I can't really do drug desensitizations in the office. Desensitizations are usually done in the hospital.  Our office will call you on Monday and see how you are doing and give you an update as well.   Skin care recommendations  Bath time: . Always use lukewarm water. AVOID very hot or cold water. Marland Kitchen Keep bathing time to 5-10 minutes. . Do NOT use bubble bath. . Use a mild soap and use just enough to wash the dirty areas. . Do NOT scrub skin vigorously.  . After bathing, pat dry your skin with a towel. Do NOT rub or scrub the skin.  Moisturizers and prescriptions:  . ALWAYS apply moisturizers immediately after bathing (within 3 minutes). This helps to lock-in moisture. . Use the moisturizer several times a day over the whole body. Kermit Balo summer moisturizers include: Aveeno, CeraVe, Cetaphil. Kermit Balo winter moisturizers include: Aquaphor, Vaseline, Cerave, Cetaphil, Eucerin, Vanicream. . When using moisturizers along with  medications, the moisturizer should be applied about one hour after applying the medication to prevent diluting effect of the medication or moisturize around where you applied the medications. When not using medications, the moisturizer can be continued twice daily as maintenance.  Laundry and clothing: . Avoid laundry products with added color or perfumes. . Use unscented hypo-allergenic laundry products such as Tide free, Cheer free & gentle, and All free and clear.  . If the skin still seems dry or sensitive, you can try double-rinsing the clothes. . Avoid tight or scratchy clothing such as wool. . Do not use fabric softeners or dyer sheets.

## 2019-11-13 NOTE — Progress Notes (Signed)
New Patient Note  RE: [name] MRN: [id number] DOB: 25-Jun-1951 Date of Office Visit: 11/13/2019  Referring provider: Evelena Asa, MD Primary care provider: [name], MD  Chief Complaint: Rash, Angioedema, and Allergy Testing  History of Present Illness: I had the pleasure of seeing [name] for initial evaluation at the [facility] on 11/18/2019. She is a 69 y.o. female, who is referred here by Dr. Evelena Asa (ID) for the evaluation of penicillin allergy.  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   Patient has history of reactions to penicillin and bactrim.  She underwent graded ceftriaxone challenge as an in-patient and tolerated it well for about 2 weeks then it was stopped as she developed chills, fevers, whole body aches 2 hours after the infusion. This happened about 3 times and each time she had worse symptoms and the IV ceftriaxone was stopped on 11/04/19.    She has been on IV vancomycin BID only now but yesterday afternoon broke out in whole body pruritic rash. This was before her evening vancomycin dose. She took some benadryl with some benefit.   Penicillin allergy: Patient had reaction to penicillin about 30-40 years ago with throat swelling, shortness of breath, and hives. Symptoms resolved after a few days. She is not sure how many doses she took before developing symptoms and what she was being treated for.  Since then she has been avoiding penicillin type of antibiotics.  Other antibiotic allergy: Tetracyclines caused hives in the past. Bactrim was prescribed for an abscess last year and she developed whole body rash. She was switched to clindamycin and tolerated that well.    Denies any changes in diet or personal care products. She did sit out in the screened porch yesterday.   Patient last took oxycodone about 3-4  days ago, tramadol about 2 weeks ago.  Other new medication is Eliquis since the surgery to prevent blood clots.   Spoke with Dr. Eda Keys (ID) on the phone and plan was to continue for a total of 6 weeks of IV antibiotics and then 3 months of oral suppression most likely with keflex 500mg  TID.  Assessment and Plan: Mena is a 69 y.o. 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 Patient follows with Dr. Evelena Asa (ID) and was on IV ceftriaxone x 2 weeks and IV vancomycin until yesterday. Initially referred for penicillin testing as plan was to continue oral suppression for 3 months with keflex 500mg  TID after completion of 6 week of IV antibiotics. However in the interim patient developed fevers, chills, whole body ache after ceftriaxone infusions on 3 separate occasions - last infusion on 11/04/19. Yesterday she developed pruritic rash on her torso. IV vancomycin was stopped. No recent pain medications were taken and the only new medication to her regimen is the Eliquis. Patient also has a right knee replaced with no issues. Penicillin over 30 years ago caused throat swelling, shortness of breath and hives; tetracyclines caused hives and bactrim caused whole body rash. Tolerated clindamycin in the past with no issues.   Unable to skin test today due to recent antihistamine intake and current pruritic rash on her whole upper torso.   Discussed case with her ID physician. I can do the penicillin skin testing as long as  she is able to come off antihistamines for 3 days and the keflex drug challenge in the office. However, if she needs drug desensitization that will need to be done as an in-patient and recommend to go to an academic center for that.   Pruritic rash Difficult to decipher which medication is contributing to her current pruritic rash. It's more likely due to the vancomycin as she stopped  ceftriaxone however can't rule that out completely. Other new medication in her regimen since the surgery is the Eliquis.   Discussed with patient to follow up with ID to see what antibiotics they want her to take.  Start zyrtec 10mg  twice a day to help with the itching/rash.   May take benadryl 25mg  to 50mg  additionally every 6-8 hours as needed for the itching/rash.   May use topical triamcinolone ointment on the rash twice a day as needed.   Gave handout on proper skin care.   Stop vancomycin and ceftriaxone for now.   If symptoms do not improve then will have to investigate further if any of the other medications she is currently taking may be contributing to her symptoms.   Get bloodwork as below.  Return in about 1 week (around 11/20/2019) for Drug challenge.  Meds ordered this encounter  Medications   triamcinolone ointment (KENALOG) 0.1 %    Sig: Twice a day as needed. Do not use on the face, neck, armpits or groin area. Do not use more than 3 weeks in a row.    Dispense:  80 g    Refill:  0    Lab Orders     Chronic Urticaria     CBC with Differential/Platelet     Tryptase     Comprehensive metabolic panel     Allergens w/Total IgE Area 2     Other/Misc lab test     Other/Misc lab test  Other allergy screening: Asthma: no Rhino conjunctivitis: no Food allergy: no Medication allergy: yes Hymenoptera allergy: no Urticaria: no Eczema:no  Diagnostics: None.  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 08/2017 MV: mid-basilar anteroseptal ischemia;  b. 08/2017 Cath: LM nl, LAD 27m, D2 30, RI nl, LCX 20ost, RCA large, nl, EF 65%.   Diastolic dysfunction    a. 08/2017 Echo: EF 60-65%, no rwma, Gr1 DD.   Hypokalemia    takes losartan for this   Past Surgical History: Past Surgical History:  Procedure Laterality Date   DIAGNOSTIC LAPAROSCOPY     ovarian cystectomy/ BSO done.   HARDWARE REMOVAL Left 07/03/2016   Procedure: LEFT KNEE HARDWARE REMOVAL;  Surgeon: Paralee Cancel, MD;  Location: [facility];  Service: Orthopedics;  Laterality: Left;   I & D EXTREMITY Left 07/11/2019   Procedure: IRRIGATION AND DEBRIDEMENT  LEFT KNEE,PLACEMENT OF [place] BEADS;  Surgeon: Mcarthur Rossetti, MD;  Location: [facility];  Service: Orthopedics;  Laterality: Left;   KNEE ARTHROSCOPY Left 07/11/2019   Procedure: ARTHROSCOPY KNEE;  Surgeon: Mcarthur Rossetti, MD;  Location: [facility];  Service: Orthopedics;  Laterality: Left;   KNEE SURGERY Bilateral    ACL x2, meniscectomy x3, open patella(bone from other knee)   LEFT HEART CATH AND CORONARY ANGIOGRAPHY N/A 09/03/2017   Procedure: LEFT HEART CATH AND CORONARY ANGIOGRAPHY;  Surgeon: Nelva Bush, MD;  Location: [facility];  Service: Cardiovascular;  Laterality: N/A;   ROTATOR CUFF REPAIR Right    SYNOVECTOMY WITH POLY EXCHANGE Left 03/14/2019   Procedure: IRRIGATION AND DEBRIDEMENT, OPEN SYNOVECTOMY LEFT KNEE WITH POLY EXCHANGE, PLACEMENT OF STIMULAN BEADS;  Surgeon: Mcarthur Rossetti, MD;  Location: [facility];  Service: Orthopedics;  Laterality: Left;   TOTAL KNEE ARTHROPLASTY Bilateral 07/03/2016   Procedure: TOTAL KNEE BILATERAL ARTHROPLASTY;  Surgeon: Paralee Cancel, MD;  Location: [facility];  Service:  Orthopedics;  Laterality: Bilateral;   Medication List:  Current Outpatient Medications  Medication Sig Dispense Refill   acetaminophen (TYLENOL) 500 MG tablet Take 1,000 mg by mouth every 6 (six) hours as needed for moderate pain or headache.     Cholecalciferol (DIALYVITE VITAMIN D 5000) 125 MCG (5000 UT) capsule Take 5,000 Units by mouth daily.     DULoxetine (CYMBALTA) 60 MG capsule Take 60 mg by mouth 2 (two) times daily.     ELIQUIS 5 MG TABS tablet Take 5 mg by mouth 2 (two) times daily.     losartan (COZAAR) 25 MG tablet TAKE ONE TABLET BY MOUTH DAILY (Patient taking differently: Take 25 mg by mouth daily. ) 90 tablet 3   omeprazole (PRILOSEC) 40 MG capsule Take 20 mg by mouth daily.   6   oxyCODONE (OXY IR/ROXICODONE) 5 MG immediate release tablet Take 1-2 tablets (5-10 mg total) by mouth every 6 (six) hours as needed for moderate pain (pain score 4-6). 30 tablet 0   simvastatin (ZOCOR) 20 MG tablet Take 20 mg by mouth every evening.   6   topiramate (TOPAMAX) 100 MG tablet      traMADol (ULTRAM) 50 MG tablet      vancomycin (VANCOCIN) 10 G SOLR injection      triamcinolone ointment (KENALOG) 0.1 % Twice a day as needed. Do not use on the face, neck, armpits or groin area. Do not use more than 3 weeks in a row. 80 g 0   No current facility-administered medications for this visit.   Allergies: Allergies  Allergen Reactions   Penicillins Anaphylaxis and Swelling    Did it involve swelling of the face/tongue/throat, SOB, or low BP? Yes Did it involve sudden or severe rash/hives, skin peeling, or any reaction on the inside of your mouth or nose? No Did you need to seek medical attention at a hospital or doctor's office? Yes When did it last happen?40 + years If all above answers are NO, may proceed with cephalosporin use.    Tetracyclines & Related Hives   Sulfamethoxazole-Trimethoprim Rash   Social History: Social History   Socioeconomic History    Marital status: Married    Spouse name: Not on file   Number of children: Not on file   Years of education: Not on file   Highest education level: Not on file  Occupational History   Not on file  Tobacco Use   Smoking status: Never Smoker  Smokeless tobacco: Never Used  Substance and Sexual Activity   Alcohol use: Yes    Comment: social x3-4 week   Drug use: No   Sexual activity: Yes    Birth control/protection: None  Other Topics Concern   Not on file  Social History Narrative   Not on file   Social Determinants of Health   Financial Resource Strain:    Difficulty of Paying Living Expenses:   Food Insecurity:    Worried About Charity fundraiser in the Last Year:    Arboriculturist in the Last Year:   Transportation Needs:    Film/video editor (Medical):    Lack of Transportation (Non-Medical):   Physical Activity:    Days of Exercise per Week:    Minutes of Exercise per Session:   Stress:    Feeling of Stress :   Social Connections:    Frequency of Communication with Friends and Family:    Frequency of Social Gatherings with Friends and Family:    Attends Religious Services:    Active Member of Clubs or Organizations:    Attends Archivist Meetings:    Marital Status:    Lives in a 68 year old house. Smoking: denies Occupation: retired  Programme researcher, broadcasting/film/video HistoryFreight forwarder in the house: no Charity fundraiser in the family room: no Carpet in the bedroom: no Heating: gas Cooling: central Pet: yes 1 dog x 6 yrs  Family History: Family History  Problem Relation Age of Onset   Breast cancer Mother    Stroke Father    Heart attack Paternal Grandfather    Review of Systems  Constitutional: Negative for appetite change, chills, fever and unexpected weight change.  HENT: Negative for congestion and rhinorrhea.   Eyes: Negative for itching.  Respiratory: Negative for cough, chest tightness, shortness of breath and  wheezing.   Cardiovascular: Negative for chest pain.  Gastrointestinal: Negative for abdominal pain.  Genitourinary: Negative for difficulty urinating.  Skin: Positive for rash.  Allergic/Immunologic: Negative for environmental allergies and food allergies.  Neurological: Negative for headaches.   Objective: BP 116/72 (BP Location: Left Arm, Patient Position: Sitting, Cuff Size: Normal)    Pulse 99    Temp 97.7 F (36.5 C) (Temporal)    Resp 16    Ht 5\' 7"  (1.702 m)    Wt 152 lb 8 oz (69.2 kg)    SpO2 99%    BMI 23.88 kg/m  Body mass index is 23.88 kg/m. Physical Exam  Constitutional: She is oriented to person, place, and time. She appears well-developed and well-nourished.  HENT:  Head: Normocephalic and atraumatic.  Right Ear: External ear normal.  Left Ear: External ear normal.  Nose: Nose normal.  Mouth/Throat: Oropharynx is clear and moist.  Eyes: Conjunctivae and EOM are normal.  Cardiovascular: Normal rate, regular rhythm and normal heart sounds. Exam reveals no gallop and no friction rub.  No murmur heard. Pulmonary/Chest: Effort normal and breath sounds normal. She has no wheezes. She has no rales.  Abdominal: Soft.  Musculoskeletal:     Cervical back: Neck supple.  Neurological: She is alert and oriented to person, place, and time.  Skin: Skin is warm. Rash noted.  Pinpoint erythematous, blanchable rash throughout the anterior torso.   Psychiatric: She has a normal mood and affect. Her behavior is normal.  Nursing note and vitals reviewed.  The plan was reviewed with the patient/family, and all questions/concerned were addressed.  It was my pleasure to see  Tonetta today and participate in her care. Please feel free to contact me with any questions or concerns.  Sincerely,  Rexene Alberts, DO Allergy & Immunology  [facility] office: [phone] [facility] office: [place] office: [phone]

## 2019-11-14 DIAGNOSIS — T8454XA Infection and inflammatory reaction due to internal left knee prosthesis, initial encounter: Secondary | ICD-10-CM | POA: Diagnosis not present

## 2019-11-14 DIAGNOSIS — T8459XA Infection and inflammatory reaction due to other internal joint prosthesis, initial encounter: Secondary | ICD-10-CM | POA: Diagnosis not present

## 2019-11-15 DIAGNOSIS — T8454XA Infection and inflammatory reaction due to internal left knee prosthesis, initial encounter: Secondary | ICD-10-CM | POA: Diagnosis not present

## 2019-11-16 DIAGNOSIS — T8454XA Infection and inflammatory reaction due to internal left knee prosthesis, initial encounter: Secondary | ICD-10-CM | POA: Diagnosis not present

## 2019-11-17 ENCOUNTER — Telehealth: Payer: Self-pay | Admitting: Allergy

## 2019-11-17 DIAGNOSIS — R21 Rash and other nonspecific skin eruption: Secondary | ICD-10-CM | POA: Diagnosis not present

## 2019-11-17 DIAGNOSIS — T8450XA Infection and inflammatory reaction due to unspecified internal joint prosthesis, initial encounter: Secondary | ICD-10-CM | POA: Diagnosis not present

## 2019-11-17 DIAGNOSIS — R160 Hepatomegaly, not elsewhere classified: Secondary | ICD-10-CM | POA: Diagnosis not present

## 2019-11-17 DIAGNOSIS — I82409 Acute embolism and thrombosis of unspecified deep veins of unspecified lower extremity: Secondary | ICD-10-CM | POA: Diagnosis not present

## 2019-11-17 DIAGNOSIS — T8454XA Infection and inflammatory reaction due to internal left knee prosthesis, initial encounter: Secondary | ICD-10-CM | POA: Diagnosis not present

## 2019-11-17 DIAGNOSIS — K759 Inflammatory liver disease, unspecified: Secondary | ICD-10-CM | POA: Diagnosis not present

## 2019-11-17 NOTE — Telephone Encounter (Signed)
I tried to call patient for follow up but home phone number is not valid and mobile phone number is their work number.  Can someone call the emergency number and see if there's another phone number for the patient?  I need to talk to her regarding her rash and what her physician on Friday told her about what antibiotics she is to continue.  Thank you.

## 2019-11-17 NOTE — Telephone Encounter (Signed)
Spoke with patient.  Patient is going back to [place] this week.  No new IV antibiotics started yet.  Bloodwork was not drawn yet. She is going to come to [facility] office to have it drawn. Patient's pruritis has decreased with the antihistamines but the rash is still there.

## 2019-11-17 NOTE — Telephone Encounter (Signed)
Attempted to call patient's home phone number and was able to speak with the patient and have you consult. Please advise any needed changed. Thank You.

## 2019-11-18 DIAGNOSIS — E871 Hypo-osmolality and hyponatremia: Secondary | ICD-10-CM | POA: Diagnosis not present

## 2019-11-18 DIAGNOSIS — R897 Abnormal histological findings in specimens from other organs, systems and tissues: Secondary | ICD-10-CM | POA: Diagnosis not present

## 2019-11-18 DIAGNOSIS — N179 Acute kidney failure, unspecified: Secondary | ICD-10-CM | POA: Diagnosis not present

## 2019-11-18 DIAGNOSIS — D721 Eosinophilia, unspecified: Secondary | ICD-10-CM | POA: Diagnosis not present

## 2019-11-18 DIAGNOSIS — I82402 Acute embolism and thrombosis of unspecified deep veins of left lower extremity: Secondary | ICD-10-CM | POA: Diagnosis not present

## 2019-11-18 DIAGNOSIS — R7401 Elevation of levels of liver transaminase levels: Secondary | ICD-10-CM | POA: Diagnosis not present

## 2019-11-18 DIAGNOSIS — E785 Hyperlipidemia, unspecified: Secondary | ICD-10-CM | POA: Diagnosis not present

## 2019-11-18 DIAGNOSIS — D7212 Drug rash with eosinophilia and systemic symptoms syndrome: Secondary | ICD-10-CM | POA: Diagnosis not present

## 2019-11-18 DIAGNOSIS — Z881 Allergy status to other antibiotic agents status: Secondary | ICD-10-CM | POA: Insufficient documentation

## 2019-11-18 DIAGNOSIS — R21 Rash and other nonspecific skin eruption: Secondary | ICD-10-CM | POA: Diagnosis not present

## 2019-11-18 DIAGNOSIS — Y831 Surgical operation with implant of artificial internal device as the cause of abnormal reaction of the patient, or of later complication, without mention of misadventure at the time of the procedure: Secondary | ICD-10-CM | POA: Diagnosis not present

## 2019-11-18 DIAGNOSIS — R160 Hepatomegaly, not elsewhere classified: Secondary | ICD-10-CM | POA: Diagnosis not present

## 2019-11-18 DIAGNOSIS — E872 Acidosis: Secondary | ICD-10-CM | POA: Diagnosis not present

## 2019-11-18 DIAGNOSIS — I824Z9 Acute embolism and thrombosis of unspecified deep veins of unspecified distal lower extremity: Secondary | ICD-10-CM | POA: Diagnosis not present

## 2019-11-18 DIAGNOSIS — T8454XA Infection and inflammatory reaction due to internal left knee prosthesis, initial encounter: Secondary | ICD-10-CM | POA: Diagnosis not present

## 2019-11-18 DIAGNOSIS — K759 Inflammatory liver disease, unspecified: Secondary | ICD-10-CM | POA: Diagnosis not present

## 2019-11-18 DIAGNOSIS — L308 Other specified dermatitis: Secondary | ICD-10-CM | POA: Diagnosis not present

## 2019-11-18 DIAGNOSIS — T8450XA Infection and inflammatory reaction due to unspecified internal joint prosthesis, initial encounter: Secondary | ICD-10-CM | POA: Diagnosis not present

## 2019-11-18 DIAGNOSIS — E860 Dehydration: Secondary | ICD-10-CM | POA: Diagnosis not present

## 2019-11-18 DIAGNOSIS — R1310 Dysphagia, unspecified: Secondary | ICD-10-CM | POA: Diagnosis not present

## 2019-11-18 DIAGNOSIS — I82409 Acute embolism and thrombosis of unspecified deep veins of unspecified lower extremity: Secondary | ICD-10-CM | POA: Diagnosis not present

## 2019-11-18 NOTE — Assessment & Plan Note (Addendum)
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 Patient follows with Dr. Evelena Asa (ID) and was on IV ceftriaxone x 2 weeks and IV vancomycin until yesterday. Initially referred for penicillin testing as plan was to continue oral suppression for 3 months with keflex 500mg  TID after completion of 6 week of IV antibiotics. However in the interim patient developed fevers, chills, whole body ache after ceftriaxone infusions on 3 separate occasions - last infusion on 11/04/19. Yesterday she developed pruritic rash on her torso. IV vancomycin was stopped. No recent pain medications were taken and the only new medication to her regimen is the Eliquis. Patient also has a right knee replaced with no issues. Penicillin over 30 years ago caused throat swelling, shortness of breath and hives; tetracyclines caused hives and bactrim caused whole body rash. Tolerated clindamycin in the past with no issues.  . Unable to skin test today due to recent antihistamine intake and current pruritic rash on her whole upper torso.   Discussed case with her ID physician. I can do the penicillin skin testing as long as she is able to come off antihistamines for 3 days and the keflex drug challenge in the office. However, if she needs drug desensitization that will need to be done as an in-patient and recommend to go to an academic center for that.

## 2019-11-18 NOTE — Assessment & Plan Note (Addendum)
Difficult to decipher which medication is contributing to her current pruritic rash. It's more likely due to the vancomycin as she stopped ceftriaxone however can't rule that out completely. Other new medication in her regimen since the surgery is the Eliquis.   Discussed with patient to follow up with ID to see what antibiotics they want her to take.  Start zyrtec 10mg  twice a day to help with the itching/rash.   May take benadryl 25mg  to 50mg  additionally every 6-8 hours as needed for the itching/rash.   May use topical triamcinolone ointment on the rash twice a day as needed.   Gave handout on proper skin care.   Stop vancomycin and ceftriaxone for now.   If symptoms do not improve then will have to investigate further if any of the other medications she is currently taking may be contributing to her symptoms.   Get bloodwork as below.

## 2019-11-19 DIAGNOSIS — T8450XA Infection and inflammatory reaction due to unspecified internal joint prosthesis, initial encounter: Secondary | ICD-10-CM | POA: Diagnosis not present

## 2019-11-19 DIAGNOSIS — K759 Inflammatory liver disease, unspecified: Secondary | ICD-10-CM | POA: Diagnosis not present

## 2019-11-19 DIAGNOSIS — I824Z9 Acute embolism and thrombosis of unspecified deep veins of unspecified distal lower extremity: Secondary | ICD-10-CM | POA: Diagnosis not present

## 2019-11-19 DIAGNOSIS — R1310 Dysphagia, unspecified: Secondary | ICD-10-CM | POA: Diagnosis not present

## 2019-11-19 DIAGNOSIS — T8454XA Infection and inflammatory reaction due to internal left knee prosthesis, initial encounter: Secondary | ICD-10-CM | POA: Diagnosis not present

## 2019-11-19 DIAGNOSIS — R21 Rash and other nonspecific skin eruption: Secondary | ICD-10-CM | POA: Diagnosis not present

## 2019-11-20 ENCOUNTER — Ambulatory Visit: Payer: Medicare HMO | Admitting: Allergy

## 2019-11-20 DIAGNOSIS — R21 Rash and other nonspecific skin eruption: Secondary | ICD-10-CM | POA: Diagnosis not present

## 2019-11-20 DIAGNOSIS — K759 Inflammatory liver disease, unspecified: Secondary | ICD-10-CM | POA: Diagnosis not present

## 2019-11-20 DIAGNOSIS — I824Z9 Acute embolism and thrombosis of unspecified deep veins of unspecified distal lower extremity: Secondary | ICD-10-CM | POA: Diagnosis not present

## 2019-11-20 DIAGNOSIS — R1310 Dysphagia, unspecified: Secondary | ICD-10-CM | POA: Diagnosis not present

## 2019-11-20 DIAGNOSIS — T8450XA Infection and inflammatory reaction due to unspecified internal joint prosthesis, initial encounter: Secondary | ICD-10-CM | POA: Diagnosis not present

## 2019-11-20 DIAGNOSIS — T8454XA Infection and inflammatory reaction due to internal left knee prosthesis, initial encounter: Secondary | ICD-10-CM | POA: Diagnosis not present

## 2019-11-21 DIAGNOSIS — K759 Inflammatory liver disease, unspecified: Secondary | ICD-10-CM | POA: Diagnosis not present

## 2019-11-21 DIAGNOSIS — I824Z9 Acute embolism and thrombosis of unspecified deep veins of unspecified distal lower extremity: Secondary | ICD-10-CM | POA: Diagnosis not present

## 2019-11-21 DIAGNOSIS — R1310 Dysphagia, unspecified: Secondary | ICD-10-CM | POA: Diagnosis not present

## 2019-11-21 DIAGNOSIS — T8450XA Infection and inflammatory reaction due to unspecified internal joint prosthesis, initial encounter: Secondary | ICD-10-CM | POA: Diagnosis not present

## 2019-11-21 DIAGNOSIS — R21 Rash and other nonspecific skin eruption: Secondary | ICD-10-CM | POA: Diagnosis not present

## 2019-11-22 DIAGNOSIS — K759 Inflammatory liver disease, unspecified: Secondary | ICD-10-CM | POA: Diagnosis not present

## 2019-11-22 DIAGNOSIS — R21 Rash and other nonspecific skin eruption: Secondary | ICD-10-CM | POA: Diagnosis not present

## 2019-11-22 DIAGNOSIS — R1310 Dysphagia, unspecified: Secondary | ICD-10-CM | POA: Diagnosis not present

## 2019-11-22 DIAGNOSIS — I824Z9 Acute embolism and thrombosis of unspecified deep veins of unspecified distal lower extremity: Secondary | ICD-10-CM | POA: Diagnosis not present

## 2019-11-22 DIAGNOSIS — T8450XA Infection and inflammatory reaction due to unspecified internal joint prosthesis, initial encounter: Secondary | ICD-10-CM | POA: Diagnosis not present

## 2019-11-23 DIAGNOSIS — R21 Rash and other nonspecific skin eruption: Secondary | ICD-10-CM | POA: Diagnosis not present

## 2019-11-23 DIAGNOSIS — I824Z9 Acute embolism and thrombosis of unspecified deep veins of unspecified distal lower extremity: Secondary | ICD-10-CM | POA: Diagnosis not present

## 2019-11-23 DIAGNOSIS — T8450XA Infection and inflammatory reaction due to unspecified internal joint prosthesis, initial encounter: Secondary | ICD-10-CM | POA: Diagnosis not present

## 2019-11-23 DIAGNOSIS — K759 Inflammatory liver disease, unspecified: Secondary | ICD-10-CM | POA: Diagnosis not present

## 2019-11-23 DIAGNOSIS — R1310 Dysphagia, unspecified: Secondary | ICD-10-CM | POA: Diagnosis not present

## 2019-11-24 DIAGNOSIS — I824Z9 Acute embolism and thrombosis of unspecified deep veins of unspecified distal lower extremity: Secondary | ICD-10-CM | POA: Diagnosis not present

## 2019-11-24 DIAGNOSIS — K759 Inflammatory liver disease, unspecified: Secondary | ICD-10-CM | POA: Diagnosis not present

## 2019-11-24 DIAGNOSIS — R21 Rash and other nonspecific skin eruption: Secondary | ICD-10-CM | POA: Diagnosis not present

## 2019-11-24 DIAGNOSIS — T8450XA Infection and inflammatory reaction due to unspecified internal joint prosthesis, initial encounter: Secondary | ICD-10-CM | POA: Diagnosis not present

## 2019-11-24 DIAGNOSIS — R1310 Dysphagia, unspecified: Secondary | ICD-10-CM | POA: Diagnosis not present

## 2019-11-25 DIAGNOSIS — D7212 Drug rash with eosinophilia and systemic symptoms syndrome: Secondary | ICD-10-CM | POA: Diagnosis not present

## 2019-11-25 DIAGNOSIS — R1310 Dysphagia, unspecified: Secondary | ICD-10-CM | POA: Diagnosis not present

## 2019-11-25 DIAGNOSIS — R21 Rash and other nonspecific skin eruption: Secondary | ICD-10-CM | POA: Diagnosis not present

## 2019-11-25 DIAGNOSIS — T8450XA Infection and inflammatory reaction due to unspecified internal joint prosthesis, initial encounter: Secondary | ICD-10-CM | POA: Diagnosis not present

## 2019-11-25 DIAGNOSIS — K759 Inflammatory liver disease, unspecified: Secondary | ICD-10-CM | POA: Diagnosis not present

## 2019-11-25 DIAGNOSIS — I824Z9 Acute embolism and thrombosis of unspecified deep veins of unspecified distal lower extremity: Secondary | ICD-10-CM | POA: Diagnosis not present

## 2019-12-02 DIAGNOSIS — D7212 Drug rash with eosinophilia and systemic symptoms syndrome: Secondary | ICD-10-CM | POA: Diagnosis not present

## 2019-12-02 DIAGNOSIS — R7989 Other specified abnormal findings of blood chemistry: Secondary | ICD-10-CM | POA: Diagnosis not present

## 2019-12-02 DIAGNOSIS — Z880 Allergy status to penicillin: Secondary | ICD-10-CM | POA: Diagnosis not present

## 2019-12-03 DIAGNOSIS — T50905A Adverse effect of unspecified drugs, medicaments and biological substances, initial encounter: Secondary | ICD-10-CM | POA: Diagnosis not present

## 2019-12-03 DIAGNOSIS — Z0000 Encounter for general adult medical examination without abnormal findings: Secondary | ICD-10-CM | POA: Diagnosis not present

## 2019-12-03 DIAGNOSIS — D7212 Drug rash with eosinophilia and systemic symptoms syndrome: Secondary | ICD-10-CM | POA: Diagnosis not present

## 2019-12-03 DIAGNOSIS — R748 Abnormal levels of other serum enzymes: Secondary | ICD-10-CM | POA: Diagnosis not present

## 2019-12-03 DIAGNOSIS — Z09 Encounter for follow-up examination after completed treatment for conditions other than malignant neoplasm: Secondary | ICD-10-CM | POA: Diagnosis not present

## 2019-12-03 DIAGNOSIS — Z96652 Presence of left artificial knee joint: Secondary | ICD-10-CM | POA: Diagnosis not present

## 2019-12-03 DIAGNOSIS — K219 Gastro-esophageal reflux disease without esophagitis: Secondary | ICD-10-CM | POA: Diagnosis not present

## 2019-12-04 DIAGNOSIS — R21 Rash and other nonspecific skin eruption: Secondary | ICD-10-CM | POA: Diagnosis not present

## 2019-12-04 DIAGNOSIS — M009 Pyogenic arthritis, unspecified: Secondary | ICD-10-CM | POA: Diagnosis not present

## 2019-12-09 DIAGNOSIS — M1712 Unilateral primary osteoarthritis, left knee: Secondary | ICD-10-CM | POA: Diagnosis not present

## 2019-12-09 DIAGNOSIS — M25662 Stiffness of left knee, not elsewhere classified: Secondary | ICD-10-CM | POA: Diagnosis not present

## 2019-12-09 DIAGNOSIS — M6281 Muscle weakness (generalized): Secondary | ICD-10-CM | POA: Diagnosis not present

## 2019-12-09 DIAGNOSIS — R262 Difficulty in walking, not elsewhere classified: Secondary | ICD-10-CM | POA: Diagnosis not present

## 2019-12-10 DIAGNOSIS — Z452 Encounter for adjustment and management of vascular access device: Secondary | ICD-10-CM | POA: Diagnosis not present

## 2019-12-10 DIAGNOSIS — T8450XA Infection and inflammatory reaction due to unspecified internal joint prosthesis, initial encounter: Secondary | ICD-10-CM | POA: Diagnosis not present

## 2019-12-10 DIAGNOSIS — Y939 Activity, unspecified: Secondary | ICD-10-CM | POA: Diagnosis not present

## 2019-12-11 DIAGNOSIS — M6281 Muscle weakness (generalized): Secondary | ICD-10-CM | POA: Diagnosis not present

## 2019-12-11 DIAGNOSIS — M25662 Stiffness of left knee, not elsewhere classified: Secondary | ICD-10-CM | POA: Diagnosis not present

## 2019-12-11 DIAGNOSIS — R262 Difficulty in walking, not elsewhere classified: Secondary | ICD-10-CM | POA: Diagnosis not present

## 2019-12-11 DIAGNOSIS — M1712 Unilateral primary osteoarthritis, left knee: Secondary | ICD-10-CM | POA: Diagnosis not present

## 2019-12-11 DIAGNOSIS — T8454XA Infection and inflammatory reaction due to internal left knee prosthesis, initial encounter: Secondary | ICD-10-CM | POA: Diagnosis not present

## 2019-12-12 DIAGNOSIS — T8454XA Infection and inflammatory reaction due to internal left knee prosthesis, initial encounter: Secondary | ICD-10-CM | POA: Diagnosis not present

## 2019-12-12 DIAGNOSIS — M25662 Stiffness of left knee, not elsewhere classified: Secondary | ICD-10-CM | POA: Diagnosis not present

## 2019-12-12 DIAGNOSIS — M1712 Unilateral primary osteoarthritis, left knee: Secondary | ICD-10-CM | POA: Diagnosis not present

## 2019-12-12 DIAGNOSIS — R262 Difficulty in walking, not elsewhere classified: Secondary | ICD-10-CM | POA: Diagnosis not present

## 2019-12-12 DIAGNOSIS — M6281 Muscle weakness (generalized): Secondary | ICD-10-CM | POA: Diagnosis not present

## 2019-12-13 DIAGNOSIS — T8454XA Infection and inflammatory reaction due to internal left knee prosthesis, initial encounter: Secondary | ICD-10-CM | POA: Diagnosis not present

## 2019-12-14 DIAGNOSIS — T8454XA Infection and inflammatory reaction due to internal left knee prosthesis, initial encounter: Secondary | ICD-10-CM | POA: Diagnosis not present

## 2019-12-15 DIAGNOSIS — M6281 Muscle weakness (generalized): Secondary | ICD-10-CM | POA: Diagnosis not present

## 2019-12-15 DIAGNOSIS — T8454XA Infection and inflammatory reaction due to internal left knee prosthesis, initial encounter: Secondary | ICD-10-CM | POA: Diagnosis not present

## 2019-12-15 DIAGNOSIS — M25662 Stiffness of left knee, not elsewhere classified: Secondary | ICD-10-CM | POA: Diagnosis not present

## 2019-12-15 DIAGNOSIS — R262 Difficulty in walking, not elsewhere classified: Secondary | ICD-10-CM | POA: Diagnosis not present

## 2019-12-15 DIAGNOSIS — M1712 Unilateral primary osteoarthritis, left knee: Secondary | ICD-10-CM | POA: Diagnosis not present

## 2019-12-16 DIAGNOSIS — M25562 Pain in left knee: Secondary | ICD-10-CM | POA: Diagnosis not present

## 2019-12-16 DIAGNOSIS — G894 Chronic pain syndrome: Secondary | ICD-10-CM | POA: Diagnosis not present

## 2019-12-16 DIAGNOSIS — T8454XA Infection and inflammatory reaction due to internal left knee prosthesis, initial encounter: Secondary | ICD-10-CM | POA: Diagnosis not present

## 2019-12-16 DIAGNOSIS — G8929 Other chronic pain: Secondary | ICD-10-CM | POA: Diagnosis not present

## 2019-12-17 ENCOUNTER — Telehealth: Payer: Self-pay | Admitting: Allergy

## 2019-12-17 DIAGNOSIS — M1712 Unilateral primary osteoarthritis, left knee: Secondary | ICD-10-CM | POA: Diagnosis not present

## 2019-12-17 DIAGNOSIS — Z0000 Encounter for general adult medical examination without abnormal findings: Secondary | ICD-10-CM | POA: Diagnosis not present

## 2019-12-17 DIAGNOSIS — M25662 Stiffness of left knee, not elsewhere classified: Secondary | ICD-10-CM | POA: Diagnosis not present

## 2019-12-17 DIAGNOSIS — T8454XA Infection and inflammatory reaction due to internal left knee prosthesis, initial encounter: Secondary | ICD-10-CM | POA: Diagnosis not present

## 2019-12-17 DIAGNOSIS — R262 Difficulty in walking, not elsewhere classified: Secondary | ICD-10-CM | POA: Diagnosis not present

## 2019-12-17 DIAGNOSIS — M6281 Muscle weakness (generalized): Secondary | ICD-10-CM | POA: Diagnosis not present

## 2019-12-17 DIAGNOSIS — E7800 Pure hypercholesterolemia, unspecified: Secondary | ICD-10-CM | POA: Diagnosis not present

## 2019-12-17 NOTE — Telephone Encounter (Signed)
Please call patient to see if she is on any new type of antibiotics or awaiting for her symptoms to subside?   I received a request from Dr Corky Sing office regarding guidance on antibiotics.  Has she seen another allergist for this? We may need to schedule an office visit to further discuss options given the events that happened since the last OV.   ------------------------------------------ I received fax from Dr. Corky Sing office and reviewed office notes and the hospitalization notes.   Patient was diagnosed with DRESS syndrome and had elevated LFTS and skin biopsy suggestive of DRESS. She was discharged on prednisone.   Last note I got is from 11/25/2019.

## 2019-12-18 DIAGNOSIS — T8454XA Infection and inflammatory reaction due to internal left knee prosthesis, initial encounter: Secondary | ICD-10-CM | POA: Diagnosis not present

## 2019-12-18 NOTE — Telephone Encounter (Signed)
So she is seeing the allergist in [place] and nothing for [place] to do as of now. Correct?

## 2019-12-18 NOTE — Telephone Encounter (Signed)
Called and spoke with patient and she stated that she is currently on Dattomycin. She stated that she has seen Dr. Brigitte Pulse in [place] recently regarding this and she has testing scheduled with her at the end of May to address this.

## 2019-12-18 NOTE — Telephone Encounter (Signed)
Yes ma'm :)

## 2019-12-19 DIAGNOSIS — M25662 Stiffness of left knee, not elsewhere classified: Secondary | ICD-10-CM | POA: Diagnosis not present

## 2019-12-19 DIAGNOSIS — M1712 Unilateral primary osteoarthritis, left knee: Secondary | ICD-10-CM | POA: Diagnosis not present

## 2019-12-19 DIAGNOSIS — R262 Difficulty in walking, not elsewhere classified: Secondary | ICD-10-CM | POA: Diagnosis not present

## 2019-12-19 DIAGNOSIS — M6281 Muscle weakness (generalized): Secondary | ICD-10-CM | POA: Diagnosis not present

## 2019-12-19 DIAGNOSIS — T8454XA Infection and inflammatory reaction due to internal left knee prosthesis, initial encounter: Secondary | ICD-10-CM | POA: Diagnosis not present

## 2019-12-20 DIAGNOSIS — T8454XA Infection and inflammatory reaction due to internal left knee prosthesis, initial encounter: Secondary | ICD-10-CM | POA: Diagnosis not present

## 2019-12-21 DIAGNOSIS — T8454XA Infection and inflammatory reaction due to internal left knee prosthesis, initial encounter: Secondary | ICD-10-CM | POA: Diagnosis not present

## 2019-12-22 DIAGNOSIS — M6281 Muscle weakness (generalized): Secondary | ICD-10-CM | POA: Diagnosis not present

## 2019-12-22 DIAGNOSIS — Z959 Presence of cardiac and vascular implant and graft, unspecified: Secondary | ICD-10-CM | POA: Diagnosis not present

## 2019-12-22 DIAGNOSIS — T8454XA Infection and inflammatory reaction due to internal left knee prosthesis, initial encounter: Secondary | ICD-10-CM | POA: Diagnosis not present

## 2019-12-22 DIAGNOSIS — Z4789 Encounter for other orthopedic aftercare: Secondary | ICD-10-CM | POA: Diagnosis not present

## 2019-12-22 DIAGNOSIS — Z96653 Presence of artificial knee joint, bilateral: Secondary | ICD-10-CM | POA: Diagnosis not present

## 2019-12-22 DIAGNOSIS — M1712 Unilateral primary osteoarthritis, left knee: Secondary | ICD-10-CM | POA: Diagnosis not present

## 2019-12-22 DIAGNOSIS — R262 Difficulty in walking, not elsewhere classified: Secondary | ICD-10-CM | POA: Diagnosis not present

## 2019-12-22 DIAGNOSIS — Z9181 History of falling: Secondary | ICD-10-CM | POA: Diagnosis not present

## 2019-12-22 DIAGNOSIS — M25662 Stiffness of left knee, not elsewhere classified: Secondary | ICD-10-CM | POA: Diagnosis not present

## 2019-12-23 DIAGNOSIS — T8454XA Infection and inflammatory reaction due to internal left knee prosthesis, initial encounter: Secondary | ICD-10-CM | POA: Diagnosis not present

## 2019-12-24 DIAGNOSIS — T8454XA Infection and inflammatory reaction due to internal left knee prosthesis, initial encounter: Secondary | ICD-10-CM | POA: Diagnosis not present

## 2019-12-24 DIAGNOSIS — M25662 Stiffness of left knee, not elsewhere classified: Secondary | ICD-10-CM | POA: Diagnosis not present

## 2019-12-24 DIAGNOSIS — M1712 Unilateral primary osteoarthritis, left knee: Secondary | ICD-10-CM | POA: Diagnosis not present

## 2019-12-24 DIAGNOSIS — R262 Difficulty in walking, not elsewhere classified: Secondary | ICD-10-CM | POA: Diagnosis not present

## 2019-12-24 DIAGNOSIS — M6281 Muscle weakness (generalized): Secondary | ICD-10-CM | POA: Diagnosis not present

## 2019-12-25 DIAGNOSIS — T8454XA Infection and inflammatory reaction due to internal left knee prosthesis, initial encounter: Secondary | ICD-10-CM | POA: Diagnosis not present

## 2019-12-26 DIAGNOSIS — M25662 Stiffness of left knee, not elsewhere classified: Secondary | ICD-10-CM | POA: Diagnosis not present

## 2019-12-26 DIAGNOSIS — M1712 Unilateral primary osteoarthritis, left knee: Secondary | ICD-10-CM | POA: Diagnosis not present

## 2019-12-26 DIAGNOSIS — R262 Difficulty in walking, not elsewhere classified: Secondary | ICD-10-CM | POA: Diagnosis not present

## 2019-12-26 DIAGNOSIS — M6281 Muscle weakness (generalized): Secondary | ICD-10-CM | POA: Diagnosis not present

## 2019-12-26 DIAGNOSIS — T8454XA Infection and inflammatory reaction due to internal left knee prosthesis, initial encounter: Secondary | ICD-10-CM | POA: Diagnosis not present

## 2019-12-27 DIAGNOSIS — T8454XA Infection and inflammatory reaction due to internal left knee prosthesis, initial encounter: Secondary | ICD-10-CM | POA: Diagnosis not present

## 2019-12-28 DIAGNOSIS — T8454XA Infection and inflammatory reaction due to internal left knee prosthesis, initial encounter: Secondary | ICD-10-CM | POA: Diagnosis not present

## 2019-12-29 DIAGNOSIS — M25662 Stiffness of left knee, not elsewhere classified: Secondary | ICD-10-CM | POA: Diagnosis not present

## 2019-12-29 DIAGNOSIS — M6281 Muscle weakness (generalized): Secondary | ICD-10-CM | POA: Diagnosis not present

## 2019-12-29 DIAGNOSIS — T8454XA Infection and inflammatory reaction due to internal left knee prosthesis, initial encounter: Secondary | ICD-10-CM | POA: Diagnosis not present

## 2019-12-29 DIAGNOSIS — R262 Difficulty in walking, not elsewhere classified: Secondary | ICD-10-CM | POA: Diagnosis not present

## 2019-12-29 DIAGNOSIS — M1712 Unilateral primary osteoarthritis, left knee: Secondary | ICD-10-CM | POA: Diagnosis not present

## 2019-12-30 DIAGNOSIS — T8454XA Infection and inflammatory reaction due to internal left knee prosthesis, initial encounter: Secondary | ICD-10-CM | POA: Diagnosis not present

## 2019-12-31 DIAGNOSIS — M6281 Muscle weakness (generalized): Secondary | ICD-10-CM | POA: Diagnosis not present

## 2019-12-31 DIAGNOSIS — T8454XA Infection and inflammatory reaction due to internal left knee prosthesis, initial encounter: Secondary | ICD-10-CM | POA: Diagnosis not present

## 2019-12-31 DIAGNOSIS — M1712 Unilateral primary osteoarthritis, left knee: Secondary | ICD-10-CM | POA: Diagnosis not present

## 2019-12-31 DIAGNOSIS — T50905A Adverse effect of unspecified drugs, medicaments and biological substances, initial encounter: Secondary | ICD-10-CM | POA: Diagnosis not present

## 2019-12-31 DIAGNOSIS — M25662 Stiffness of left knee, not elsewhere classified: Secondary | ICD-10-CM | POA: Diagnosis not present

## 2019-12-31 DIAGNOSIS — R262 Difficulty in walking, not elsewhere classified: Secondary | ICD-10-CM | POA: Diagnosis not present

## 2020-01-01 DIAGNOSIS — T8454XA Infection and inflammatory reaction due to internal left knee prosthesis, initial encounter: Secondary | ICD-10-CM | POA: Diagnosis not present

## 2020-01-02 DIAGNOSIS — T8454XA Infection and inflammatory reaction due to internal left knee prosthesis, initial encounter: Secondary | ICD-10-CM | POA: Diagnosis not present

## 2020-01-02 DIAGNOSIS — M6281 Muscle weakness (generalized): Secondary | ICD-10-CM | POA: Diagnosis not present

## 2020-01-02 DIAGNOSIS — M25662 Stiffness of left knee, not elsewhere classified: Secondary | ICD-10-CM | POA: Diagnosis not present

## 2020-01-02 DIAGNOSIS — M1712 Unilateral primary osteoarthritis, left knee: Secondary | ICD-10-CM | POA: Diagnosis not present

## 2020-01-02 DIAGNOSIS — R262 Difficulty in walking, not elsewhere classified: Secondary | ICD-10-CM | POA: Diagnosis not present

## 2020-01-03 DIAGNOSIS — T8454XA Infection and inflammatory reaction due to internal left knee prosthesis, initial encounter: Secondary | ICD-10-CM | POA: Diagnosis not present

## 2020-01-04 DIAGNOSIS — T8454XA Infection and inflammatory reaction due to internal left knee prosthesis, initial encounter: Secondary | ICD-10-CM | POA: Diagnosis not present

## 2020-01-05 DIAGNOSIS — M25662 Stiffness of left knee, not elsewhere classified: Secondary | ICD-10-CM | POA: Diagnosis not present

## 2020-01-05 DIAGNOSIS — M6281 Muscle weakness (generalized): Secondary | ICD-10-CM | POA: Diagnosis not present

## 2020-01-05 DIAGNOSIS — R262 Difficulty in walking, not elsewhere classified: Secondary | ICD-10-CM | POA: Diagnosis not present

## 2020-01-05 DIAGNOSIS — M1712 Unilateral primary osteoarthritis, left knee: Secondary | ICD-10-CM | POA: Diagnosis not present

## 2020-01-05 DIAGNOSIS — T8454XA Infection and inflammatory reaction due to internal left knee prosthesis, initial encounter: Secondary | ICD-10-CM | POA: Diagnosis not present

## 2020-01-06 DIAGNOSIS — T8454XA Infection and inflammatory reaction due to internal left knee prosthesis, initial encounter: Secondary | ICD-10-CM | POA: Diagnosis not present

## 2020-01-07 DIAGNOSIS — M25662 Stiffness of left knee, not elsewhere classified: Secondary | ICD-10-CM | POA: Diagnosis not present

## 2020-01-07 DIAGNOSIS — R262 Difficulty in walking, not elsewhere classified: Secondary | ICD-10-CM | POA: Diagnosis not present

## 2020-01-07 DIAGNOSIS — T8454XA Infection and inflammatory reaction due to internal left knee prosthesis, initial encounter: Secondary | ICD-10-CM | POA: Diagnosis not present

## 2020-01-07 DIAGNOSIS — M6281 Muscle weakness (generalized): Secondary | ICD-10-CM | POA: Diagnosis not present

## 2020-01-07 DIAGNOSIS — M1712 Unilateral primary osteoarthritis, left knee: Secondary | ICD-10-CM | POA: Diagnosis not present

## 2020-01-08 DIAGNOSIS — T8454XA Infection and inflammatory reaction due to internal left knee prosthesis, initial encounter: Secondary | ICD-10-CM | POA: Diagnosis not present

## 2020-01-09 DIAGNOSIS — T8454XA Infection and inflammatory reaction due to internal left knee prosthesis, initial encounter: Secondary | ICD-10-CM | POA: Diagnosis not present

## 2020-01-09 DIAGNOSIS — M25662 Stiffness of left knee, not elsewhere classified: Secondary | ICD-10-CM | POA: Diagnosis not present

## 2020-01-09 DIAGNOSIS — M6281 Muscle weakness (generalized): Secondary | ICD-10-CM | POA: Diagnosis not present

## 2020-01-09 DIAGNOSIS — M1712 Unilateral primary osteoarthritis, left knee: Secondary | ICD-10-CM | POA: Diagnosis not present

## 2020-01-09 DIAGNOSIS — R262 Difficulty in walking, not elsewhere classified: Secondary | ICD-10-CM | POA: Diagnosis not present

## 2020-01-10 DIAGNOSIS — T8454XA Infection and inflammatory reaction due to internal left knee prosthesis, initial encounter: Secondary | ICD-10-CM | POA: Diagnosis not present

## 2020-01-11 DIAGNOSIS — T8454XA Infection and inflammatory reaction due to internal left knee prosthesis, initial encounter: Secondary | ICD-10-CM | POA: Diagnosis not present

## 2020-01-12 DIAGNOSIS — R262 Difficulty in walking, not elsewhere classified: Secondary | ICD-10-CM | POA: Diagnosis not present

## 2020-01-12 DIAGNOSIS — T8454XA Infection and inflammatory reaction due to internal left knee prosthesis, initial encounter: Secondary | ICD-10-CM | POA: Diagnosis not present

## 2020-01-12 DIAGNOSIS — M25662 Stiffness of left knee, not elsewhere classified: Secondary | ICD-10-CM | POA: Diagnosis not present

## 2020-01-12 DIAGNOSIS — M6281 Muscle weakness (generalized): Secondary | ICD-10-CM | POA: Diagnosis not present

## 2020-01-12 DIAGNOSIS — M1712 Unilateral primary osteoarthritis, left knee: Secondary | ICD-10-CM | POA: Diagnosis not present

## 2020-01-13 DIAGNOSIS — Z471 Aftercare following joint replacement surgery: Secondary | ICD-10-CM | POA: Diagnosis not present

## 2020-01-13 DIAGNOSIS — T8454XA Infection and inflammatory reaction due to internal left knee prosthesis, initial encounter: Secondary | ICD-10-CM | POA: Diagnosis not present

## 2020-01-13 DIAGNOSIS — T8450XA Infection and inflammatory reaction due to unspecified internal joint prosthesis, initial encounter: Secondary | ICD-10-CM | POA: Diagnosis not present

## 2020-01-13 DIAGNOSIS — T8459XA Infection and inflammatory reaction due to other internal joint prosthesis, initial encounter: Secondary | ICD-10-CM | POA: Diagnosis not present

## 2020-01-13 DIAGNOSIS — R7401 Elevation of levels of liver transaminase levels: Secondary | ICD-10-CM | POA: Diagnosis not present

## 2020-01-14 DIAGNOSIS — Z96652 Presence of left artificial knee joint: Secondary | ICD-10-CM | POA: Diagnosis not present

## 2020-01-14 DIAGNOSIS — Z880 Allergy status to penicillin: Secondary | ICD-10-CM | POA: Diagnosis not present

## 2020-01-14 DIAGNOSIS — T8454XA Infection and inflammatory reaction due to internal left knee prosthesis, initial encounter: Secondary | ICD-10-CM | POA: Diagnosis not present

## 2020-01-14 DIAGNOSIS — L500 Allergic urticaria: Secondary | ICD-10-CM | POA: Diagnosis not present

## 2020-01-14 DIAGNOSIS — M009 Pyogenic arthritis, unspecified: Secondary | ICD-10-CM | POA: Diagnosis not present

## 2020-01-15 DIAGNOSIS — T8454XA Infection and inflammatory reaction due to internal left knee prosthesis, initial encounter: Secondary | ICD-10-CM | POA: Diagnosis not present

## 2020-01-16 DIAGNOSIS — R262 Difficulty in walking, not elsewhere classified: Secondary | ICD-10-CM | POA: Diagnosis not present

## 2020-01-16 DIAGNOSIS — M25662 Stiffness of left knee, not elsewhere classified: Secondary | ICD-10-CM | POA: Diagnosis not present

## 2020-01-16 DIAGNOSIS — M6281 Muscle weakness (generalized): Secondary | ICD-10-CM | POA: Diagnosis not present

## 2020-01-16 DIAGNOSIS — T8454XA Infection and inflammatory reaction due to internal left knee prosthesis, initial encounter: Secondary | ICD-10-CM | POA: Diagnosis not present

## 2020-01-16 DIAGNOSIS — M25562 Pain in left knee: Secondary | ICD-10-CM | POA: Diagnosis not present

## 2020-01-17 DIAGNOSIS — T8454XA Infection and inflammatory reaction due to internal left knee prosthesis, initial encounter: Secondary | ICD-10-CM | POA: Diagnosis not present

## 2020-01-18 DIAGNOSIS — T8454XA Infection and inflammatory reaction due to internal left knee prosthesis, initial encounter: Secondary | ICD-10-CM | POA: Diagnosis not present

## 2020-01-19 DIAGNOSIS — M1712 Unilateral primary osteoarthritis, left knee: Secondary | ICD-10-CM | POA: Diagnosis not present

## 2020-01-19 DIAGNOSIS — M6281 Muscle weakness (generalized): Secondary | ICD-10-CM | POA: Diagnosis not present

## 2020-01-19 DIAGNOSIS — R262 Difficulty in walking, not elsewhere classified: Secondary | ICD-10-CM | POA: Diagnosis not present

## 2020-01-19 DIAGNOSIS — M25662 Stiffness of left knee, not elsewhere classified: Secondary | ICD-10-CM | POA: Diagnosis not present

## 2020-01-19 DIAGNOSIS — T8454XA Infection and inflammatory reaction due to internal left knee prosthesis, initial encounter: Secondary | ICD-10-CM | POA: Diagnosis not present

## 2020-01-20 DIAGNOSIS — T8454XA Infection and inflammatory reaction due to internal left knee prosthesis, initial encounter: Secondary | ICD-10-CM | POA: Diagnosis not present

## 2020-01-20 DIAGNOSIS — M6281 Muscle weakness (generalized): Secondary | ICD-10-CM | POA: Diagnosis not present

## 2020-01-20 DIAGNOSIS — M1712 Unilateral primary osteoarthritis, left knee: Secondary | ICD-10-CM | POA: Diagnosis not present

## 2020-01-20 DIAGNOSIS — R262 Difficulty in walking, not elsewhere classified: Secondary | ICD-10-CM | POA: Diagnosis not present

## 2020-01-20 DIAGNOSIS — M25662 Stiffness of left knee, not elsewhere classified: Secondary | ICD-10-CM | POA: Diagnosis not present

## 2020-01-26 DIAGNOSIS — M25662 Stiffness of left knee, not elsewhere classified: Secondary | ICD-10-CM | POA: Diagnosis not present

## 2020-01-26 DIAGNOSIS — M6281 Muscle weakness (generalized): Secondary | ICD-10-CM | POA: Diagnosis not present

## 2020-01-26 DIAGNOSIS — R262 Difficulty in walking, not elsewhere classified: Secondary | ICD-10-CM | POA: Diagnosis not present

## 2020-01-26 DIAGNOSIS — M25562 Pain in left knee: Secondary | ICD-10-CM | POA: Diagnosis not present

## 2020-01-27 DIAGNOSIS — Z792 Long term (current) use of antibiotics: Secondary | ICD-10-CM | POA: Diagnosis not present

## 2020-01-27 DIAGNOSIS — Z471 Aftercare following joint replacement surgery: Secondary | ICD-10-CM | POA: Diagnosis not present

## 2020-01-30 DIAGNOSIS — M25562 Pain in left knee: Secondary | ICD-10-CM | POA: Diagnosis not present

## 2020-01-30 DIAGNOSIS — M6281 Muscle weakness (generalized): Secondary | ICD-10-CM | POA: Diagnosis not present

## 2020-01-30 DIAGNOSIS — R262 Difficulty in walking, not elsewhere classified: Secondary | ICD-10-CM | POA: Diagnosis not present

## 2020-01-30 DIAGNOSIS — M25662 Stiffness of left knee, not elsewhere classified: Secondary | ICD-10-CM | POA: Diagnosis not present

## 2020-02-04 DIAGNOSIS — M6281 Muscle weakness (generalized): Secondary | ICD-10-CM | POA: Diagnosis not present

## 2020-02-04 DIAGNOSIS — M25662 Stiffness of left knee, not elsewhere classified: Secondary | ICD-10-CM | POA: Diagnosis not present

## 2020-02-04 DIAGNOSIS — R262 Difficulty in walking, not elsewhere classified: Secondary | ICD-10-CM | POA: Diagnosis not present

## 2020-02-04 DIAGNOSIS — M1712 Unilateral primary osteoarthritis, left knee: Secondary | ICD-10-CM | POA: Diagnosis not present

## 2020-02-11 DIAGNOSIS — M25662 Stiffness of left knee, not elsewhere classified: Secondary | ICD-10-CM | POA: Diagnosis not present

## 2020-02-11 DIAGNOSIS — T50905A Adverse effect of unspecified drugs, medicaments and biological substances, initial encounter: Secondary | ICD-10-CM | POA: Diagnosis not present

## 2020-02-11 DIAGNOSIS — R262 Difficulty in walking, not elsewhere classified: Secondary | ICD-10-CM | POA: Diagnosis not present

## 2020-02-11 DIAGNOSIS — M25562 Pain in left knee: Secondary | ICD-10-CM | POA: Diagnosis not present

## 2020-02-11 DIAGNOSIS — M6281 Muscle weakness (generalized): Secondary | ICD-10-CM | POA: Diagnosis not present

## 2020-02-20 DIAGNOSIS — M25662 Stiffness of left knee, not elsewhere classified: Secondary | ICD-10-CM | POA: Diagnosis not present

## 2020-02-20 DIAGNOSIS — M1712 Unilateral primary osteoarthritis, left knee: Secondary | ICD-10-CM | POA: Diagnosis not present

## 2020-02-20 DIAGNOSIS — M25562 Pain in left knee: Secondary | ICD-10-CM | POA: Diagnosis not present

## 2020-02-20 DIAGNOSIS — M6281 Muscle weakness (generalized): Secondary | ICD-10-CM | POA: Diagnosis not present

## 2020-02-20 DIAGNOSIS — R262 Difficulty in walking, not elsewhere classified: Secondary | ICD-10-CM | POA: Diagnosis not present

## 2020-02-24 DIAGNOSIS — R262 Difficulty in walking, not elsewhere classified: Secondary | ICD-10-CM | POA: Diagnosis not present

## 2020-02-24 DIAGNOSIS — M25562 Pain in left knee: Secondary | ICD-10-CM | POA: Diagnosis not present

## 2020-02-24 DIAGNOSIS — M6281 Muscle weakness (generalized): Secondary | ICD-10-CM | POA: Diagnosis not present

## 2020-02-24 DIAGNOSIS — M25662 Stiffness of left knee, not elsewhere classified: Secondary | ICD-10-CM | POA: Diagnosis not present

## 2020-02-24 DIAGNOSIS — M1712 Unilateral primary osteoarthritis, left knee: Secondary | ICD-10-CM | POA: Diagnosis not present

## 2020-02-26 DIAGNOSIS — M6281 Muscle weakness (generalized): Secondary | ICD-10-CM | POA: Diagnosis not present

## 2020-02-26 DIAGNOSIS — R262 Difficulty in walking, not elsewhere classified: Secondary | ICD-10-CM | POA: Diagnosis not present

## 2020-02-26 DIAGNOSIS — M25562 Pain in left knee: Secondary | ICD-10-CM | POA: Diagnosis not present

## 2020-02-26 DIAGNOSIS — M25662 Stiffness of left knee, not elsewhere classified: Secondary | ICD-10-CM | POA: Diagnosis not present

## 2020-03-09 DIAGNOSIS — G5780 Other specified mononeuropathies of unspecified lower limb: Secondary | ICD-10-CM | POA: Diagnosis not present

## 2020-03-09 DIAGNOSIS — M25662 Stiffness of left knee, not elsewhere classified: Secondary | ICD-10-CM | POA: Diagnosis not present

## 2020-03-09 DIAGNOSIS — M6281 Muscle weakness (generalized): Secondary | ICD-10-CM | POA: Diagnosis not present

## 2020-03-09 DIAGNOSIS — M792 Neuralgia and neuritis, unspecified: Secondary | ICD-10-CM | POA: Diagnosis not present

## 2020-03-09 DIAGNOSIS — M25562 Pain in left knee: Secondary | ICD-10-CM | POA: Diagnosis not present

## 2020-03-09 DIAGNOSIS — R262 Difficulty in walking, not elsewhere classified: Secondary | ICD-10-CM | POA: Diagnosis not present

## 2020-03-09 DIAGNOSIS — G894 Chronic pain syndrome: Secondary | ICD-10-CM | POA: Diagnosis not present

## 2020-03-11 DIAGNOSIS — B958 Unspecified staphylococcus as the cause of diseases classified elsewhere: Secondary | ICD-10-CM | POA: Diagnosis not present

## 2020-03-11 DIAGNOSIS — M6281 Muscle weakness (generalized): Secondary | ICD-10-CM | POA: Diagnosis not present

## 2020-03-11 DIAGNOSIS — M25662 Stiffness of left knee, not elsewhere classified: Secondary | ICD-10-CM | POA: Diagnosis not present

## 2020-03-11 DIAGNOSIS — R262 Difficulty in walking, not elsewhere classified: Secondary | ICD-10-CM | POA: Diagnosis not present

## 2020-03-11 DIAGNOSIS — M25562 Pain in left knee: Secondary | ICD-10-CM | POA: Diagnosis not present

## 2020-03-11 DIAGNOSIS — M009 Pyogenic arthritis, unspecified: Secondary | ICD-10-CM | POA: Diagnosis not present

## 2020-03-15 DIAGNOSIS — M25562 Pain in left knee: Secondary | ICD-10-CM | POA: Diagnosis not present

## 2020-03-15 DIAGNOSIS — M6281 Muscle weakness (generalized): Secondary | ICD-10-CM | POA: Diagnosis not present

## 2020-03-15 DIAGNOSIS — M25662 Stiffness of left knee, not elsewhere classified: Secondary | ICD-10-CM | POA: Diagnosis not present

## 2020-03-15 DIAGNOSIS — R262 Difficulty in walking, not elsewhere classified: Secondary | ICD-10-CM | POA: Diagnosis not present

## 2020-04-01 DIAGNOSIS — T8454XD Infection and inflammatory reaction due to internal left knee prosthesis, subsequent encounter: Secondary | ICD-10-CM | POA: Diagnosis not present

## 2020-04-29 DIAGNOSIS — T8450XD Infection and inflammatory reaction due to unspecified internal joint prosthesis, subsequent encounter: Secondary | ICD-10-CM | POA: Diagnosis not present

## 2020-05-06 DIAGNOSIS — M25512 Pain in left shoulder: Secondary | ICD-10-CM | POA: Diagnosis not present

## 2020-05-06 DIAGNOSIS — M7542 Impingement syndrome of left shoulder: Secondary | ICD-10-CM | POA: Diagnosis not present

## 2020-05-06 DIAGNOSIS — R5383 Other fatigue: Secondary | ICD-10-CM | POA: Diagnosis not present

## 2020-05-06 DIAGNOSIS — M009 Pyogenic arthritis, unspecified: Secondary | ICD-10-CM | POA: Diagnosis not present

## 2020-05-06 DIAGNOSIS — Z792 Long term (current) use of antibiotics: Secondary | ICD-10-CM | POA: Diagnosis not present

## 2020-06-21 DIAGNOSIS — M7542 Impingement syndrome of left shoulder: Secondary | ICD-10-CM | POA: Diagnosis not present

## 2020-06-22 DIAGNOSIS — Z5181 Encounter for therapeutic drug level monitoring: Secondary | ICD-10-CM | POA: Diagnosis not present

## 2020-06-22 DIAGNOSIS — M792 Neuralgia and neuritis, unspecified: Secondary | ICD-10-CM | POA: Diagnosis not present

## 2020-06-22 DIAGNOSIS — Z96659 Presence of unspecified artificial knee joint: Secondary | ICD-10-CM | POA: Diagnosis not present

## 2020-06-22 DIAGNOSIS — T84018S Broken internal joint prosthesis, other site, sequela: Secondary | ICD-10-CM | POA: Diagnosis not present

## 2020-06-22 DIAGNOSIS — G8929 Other chronic pain: Secondary | ICD-10-CM | POA: Diagnosis not present

## 2020-06-22 DIAGNOSIS — M25562 Pain in left knee: Secondary | ICD-10-CM | POA: Diagnosis not present

## 2020-06-22 DIAGNOSIS — G5780 Other specified mononeuropathies of unspecified lower limb: Secondary | ICD-10-CM | POA: Diagnosis not present

## 2020-06-22 DIAGNOSIS — M1712 Unilateral primary osteoarthritis, left knee: Secondary | ICD-10-CM | POA: Diagnosis not present

## 2020-06-22 DIAGNOSIS — Z79899 Other long term (current) drug therapy: Secondary | ICD-10-CM | POA: Diagnosis not present

## 2020-06-22 DIAGNOSIS — G894 Chronic pain syndrome: Secondary | ICD-10-CM | POA: Diagnosis not present

## 2020-06-25 ENCOUNTER — Other Ambulatory Visit: Payer: Self-pay | Admitting: Cardiovascular Disease

## 2020-07-01 DIAGNOSIS — Z23 Encounter for immunization: Secondary | ICD-10-CM | POA: Diagnosis not present

## 2020-07-01 DIAGNOSIS — T8459XA Infection and inflammatory reaction due to other internal joint prosthesis, initial encounter: Secondary | ICD-10-CM | POA: Diagnosis not present

## 2020-07-09 DIAGNOSIS — M25512 Pain in left shoulder: Secondary | ICD-10-CM | POA: Diagnosis not present

## 2020-07-14 DIAGNOSIS — M7542 Impingement syndrome of left shoulder: Secondary | ICD-10-CM | POA: Diagnosis not present

## 2020-08-03 DIAGNOSIS — T8450XD Infection and inflammatory reaction due to unspecified internal joint prosthesis, subsequent encounter: Secondary | ICD-10-CM | POA: Diagnosis not present

## 2020-08-10 DIAGNOSIS — M25512 Pain in left shoulder: Secondary | ICD-10-CM | POA: Diagnosis not present

## 2020-08-10 DIAGNOSIS — I890 Lymphedema, not elsewhere classified: Secondary | ICD-10-CM | POA: Diagnosis not present

## 2020-08-10 DIAGNOSIS — M7542 Impingement syndrome of left shoulder: Secondary | ICD-10-CM | POA: Diagnosis not present

## 2020-08-10 DIAGNOSIS — Y999 Unspecified external cause status: Secondary | ICD-10-CM | POA: Diagnosis not present

## 2020-08-10 DIAGNOSIS — M94212 Chondromalacia, left shoulder: Secondary | ICD-10-CM | POA: Diagnosis not present

## 2020-08-10 DIAGNOSIS — G8918 Other acute postprocedural pain: Secondary | ICD-10-CM | POA: Diagnosis not present

## 2020-08-10 DIAGNOSIS — R600 Localized edema: Secondary | ICD-10-CM | POA: Diagnosis not present

## 2020-08-10 DIAGNOSIS — M19012 Primary osteoarthritis, left shoulder: Secondary | ICD-10-CM | POA: Diagnosis not present

## 2020-08-10 DIAGNOSIS — S43432A Superior glenoid labrum lesion of left shoulder, initial encounter: Secondary | ICD-10-CM | POA: Diagnosis not present

## 2020-08-10 DIAGNOSIS — M659 Synovitis and tenosynovitis, unspecified: Secondary | ICD-10-CM | POA: Diagnosis not present

## 2020-08-10 DIAGNOSIS — Z4889 Encounter for other specified surgical aftercare: Secondary | ICD-10-CM | POA: Diagnosis not present

## 2020-08-18 DIAGNOSIS — M25612 Stiffness of left shoulder, not elsewhere classified: Secondary | ICD-10-CM | POA: Diagnosis not present

## 2020-08-18 DIAGNOSIS — M25512 Pain in left shoulder: Secondary | ICD-10-CM | POA: Diagnosis not present

## 2020-08-25 DIAGNOSIS — H40013 Open angle with borderline findings, low risk, bilateral: Secondary | ICD-10-CM | POA: Diagnosis not present

## 2020-08-25 DIAGNOSIS — H2513 Age-related nuclear cataract, bilateral: Secondary | ICD-10-CM | POA: Diagnosis not present

## 2020-08-25 DIAGNOSIS — M25512 Pain in left shoulder: Secondary | ICD-10-CM | POA: Diagnosis not present

## 2020-08-25 DIAGNOSIS — M25612 Stiffness of left shoulder, not elsewhere classified: Secondary | ICD-10-CM | POA: Diagnosis not present

## 2020-09-02 DIAGNOSIS — M25512 Pain in left shoulder: Secondary | ICD-10-CM | POA: Diagnosis not present

## 2020-09-02 DIAGNOSIS — M25612 Stiffness of left shoulder, not elsewhere classified: Secondary | ICD-10-CM | POA: Diagnosis not present

## 2020-09-04 DIAGNOSIS — M25512 Pain in left shoulder: Secondary | ICD-10-CM | POA: Diagnosis not present

## 2020-09-04 DIAGNOSIS — I890 Lymphedema, not elsewhere classified: Secondary | ICD-10-CM | POA: Diagnosis not present

## 2020-09-04 DIAGNOSIS — Z4889 Encounter for other specified surgical aftercare: Secondary | ICD-10-CM | POA: Diagnosis not present

## 2020-09-04 DIAGNOSIS — R600 Localized edema: Secondary | ICD-10-CM | POA: Diagnosis not present

## 2020-09-06 DIAGNOSIS — Z4889 Encounter for other specified surgical aftercare: Secondary | ICD-10-CM | POA: Diagnosis not present

## 2020-09-06 DIAGNOSIS — M25512 Pain in left shoulder: Secondary | ICD-10-CM | POA: Diagnosis not present

## 2020-09-06 DIAGNOSIS — R600 Localized edema: Secondary | ICD-10-CM | POA: Diagnosis not present

## 2020-09-06 DIAGNOSIS — I890 Lymphedema, not elsewhere classified: Secondary | ICD-10-CM | POA: Diagnosis not present

## 2020-09-07 DIAGNOSIS — M25612 Stiffness of left shoulder, not elsewhere classified: Secondary | ICD-10-CM | POA: Diagnosis not present

## 2020-09-07 DIAGNOSIS — M25512 Pain in left shoulder: Secondary | ICD-10-CM | POA: Diagnosis not present

## 2020-09-09 DIAGNOSIS — M25512 Pain in left shoulder: Secondary | ICD-10-CM | POA: Diagnosis not present

## 2020-09-09 DIAGNOSIS — M25612 Stiffness of left shoulder, not elsewhere classified: Secondary | ICD-10-CM | POA: Diagnosis not present

## 2020-09-14 DIAGNOSIS — M25612 Stiffness of left shoulder, not elsewhere classified: Secondary | ICD-10-CM | POA: Diagnosis not present

## 2020-09-14 DIAGNOSIS — G8929 Other chronic pain: Secondary | ICD-10-CM | POA: Diagnosis not present

## 2020-09-14 DIAGNOSIS — M792 Neuralgia and neuritis, unspecified: Secondary | ICD-10-CM | POA: Diagnosis not present

## 2020-09-14 DIAGNOSIS — G894 Chronic pain syndrome: Secondary | ICD-10-CM | POA: Diagnosis not present

## 2020-09-14 DIAGNOSIS — M25562 Pain in left knee: Secondary | ICD-10-CM | POA: Diagnosis not present

## 2020-09-14 DIAGNOSIS — G5780 Other specified mononeuropathies of unspecified lower limb: Secondary | ICD-10-CM | POA: Diagnosis not present

## 2020-09-14 DIAGNOSIS — M25512 Pain in left shoulder: Secondary | ICD-10-CM | POA: Diagnosis not present

## 2020-09-16 DIAGNOSIS — M25612 Stiffness of left shoulder, not elsewhere classified: Secondary | ICD-10-CM | POA: Diagnosis not present

## 2020-09-16 DIAGNOSIS — M25512 Pain in left shoulder: Secondary | ICD-10-CM | POA: Diagnosis not present

## 2020-09-22 DIAGNOSIS — M25612 Stiffness of left shoulder, not elsewhere classified: Secondary | ICD-10-CM | POA: Diagnosis not present

## 2020-09-22 DIAGNOSIS — M25512 Pain in left shoulder: Secondary | ICD-10-CM | POA: Diagnosis not present

## 2020-09-23 ENCOUNTER — Other Ambulatory Visit: Payer: Self-pay | Admitting: Cardiovascular Disease

## 2020-10-05 DIAGNOSIS — M25512 Pain in left shoulder: Secondary | ICD-10-CM | POA: Diagnosis not present

## 2020-10-05 DIAGNOSIS — M25612 Stiffness of left shoulder, not elsewhere classified: Secondary | ICD-10-CM | POA: Diagnosis not present

## 2020-10-07 DIAGNOSIS — M25612 Stiffness of left shoulder, not elsewhere classified: Secondary | ICD-10-CM | POA: Diagnosis not present

## 2020-10-07 DIAGNOSIS — M25512 Pain in left shoulder: Secondary | ICD-10-CM | POA: Diagnosis not present

## 2020-10-07 DIAGNOSIS — M009 Pyogenic arthritis, unspecified: Secondary | ICD-10-CM | POA: Diagnosis not present

## 2020-10-21 DIAGNOSIS — Z4789 Encounter for other orthopedic aftercare: Secondary | ICD-10-CM | POA: Diagnosis not present

## 2020-10-22 DIAGNOSIS — M25612 Stiffness of left shoulder, not elsewhere classified: Secondary | ICD-10-CM | POA: Diagnosis not present

## 2020-10-22 DIAGNOSIS — M25512 Pain in left shoulder: Secondary | ICD-10-CM | POA: Diagnosis not present

## 2020-10-26 DIAGNOSIS — G894 Chronic pain syndrome: Secondary | ICD-10-CM | POA: Diagnosis not present

## 2020-10-26 DIAGNOSIS — M792 Neuralgia and neuritis, unspecified: Secondary | ICD-10-CM | POA: Diagnosis not present

## 2020-10-26 DIAGNOSIS — G8929 Other chronic pain: Secondary | ICD-10-CM | POA: Diagnosis not present

## 2020-10-26 DIAGNOSIS — M25562 Pain in left knee: Secondary | ICD-10-CM | POA: Diagnosis not present

## 2020-10-27 DIAGNOSIS — M25512 Pain in left shoulder: Secondary | ICD-10-CM | POA: Diagnosis not present

## 2020-10-27 DIAGNOSIS — M25612 Stiffness of left shoulder, not elsewhere classified: Secondary | ICD-10-CM | POA: Diagnosis not present

## 2020-11-29 DIAGNOSIS — G894 Chronic pain syndrome: Secondary | ICD-10-CM | POA: Diagnosis not present

## 2020-11-29 DIAGNOSIS — M792 Neuralgia and neuritis, unspecified: Secondary | ICD-10-CM | POA: Diagnosis not present

## 2020-11-29 DIAGNOSIS — M1712 Unilateral primary osteoarthritis, left knee: Secondary | ICD-10-CM | POA: Diagnosis not present

## 2020-11-29 DIAGNOSIS — M25562 Pain in left knee: Secondary | ICD-10-CM | POA: Diagnosis not present

## 2020-11-29 DIAGNOSIS — G8929 Other chronic pain: Secondary | ICD-10-CM | POA: Diagnosis not present

## 2020-12-30 DIAGNOSIS — K219 Gastro-esophageal reflux disease without esophagitis: Secondary | ICD-10-CM | POA: Diagnosis not present

## 2020-12-30 DIAGNOSIS — R1310 Dysphagia, unspecified: Secondary | ICD-10-CM | POA: Diagnosis not present

## 2020-12-30 DIAGNOSIS — Z86010 Personal history of colonic polyps: Secondary | ICD-10-CM | POA: Diagnosis not present

## 2021-01-06 DIAGNOSIS — B958 Unspecified staphylococcus as the cause of diseases classified elsewhere: Secondary | ICD-10-CM | POA: Diagnosis not present

## 2021-01-06 DIAGNOSIS — T8454XD Infection and inflammatory reaction due to internal left knee prosthesis, subsequent encounter: Secondary | ICD-10-CM | POA: Diagnosis not present

## 2021-02-08 DIAGNOSIS — E559 Vitamin D deficiency, unspecified: Secondary | ICD-10-CM | POA: Diagnosis not present

## 2021-02-08 DIAGNOSIS — R739 Hyperglycemia, unspecified: Secondary | ICD-10-CM | POA: Diagnosis not present

## 2021-02-08 DIAGNOSIS — E7800 Pure hypercholesterolemia, unspecified: Secondary | ICD-10-CM | POA: Diagnosis not present

## 2021-02-08 DIAGNOSIS — Z0000 Encounter for general adult medical examination without abnormal findings: Secondary | ICD-10-CM | POA: Diagnosis not present

## 2021-02-15 DIAGNOSIS — E7800 Pure hypercholesterolemia, unspecified: Secondary | ICD-10-CM | POA: Diagnosis not present

## 2021-02-15 DIAGNOSIS — I10 Essential (primary) hypertension: Secondary | ICD-10-CM | POA: Diagnosis not present

## 2021-02-15 DIAGNOSIS — M1990 Unspecified osteoarthritis, unspecified site: Secondary | ICD-10-CM | POA: Diagnosis not present

## 2021-02-15 DIAGNOSIS — H6120 Impacted cerumen, unspecified ear: Secondary | ICD-10-CM | POA: Diagnosis not present

## 2021-02-15 DIAGNOSIS — K219 Gastro-esophageal reflux disease without esophagitis: Secondary | ICD-10-CM | POA: Diagnosis not present

## 2021-02-15 DIAGNOSIS — R7303 Prediabetes: Secondary | ICD-10-CM | POA: Diagnosis not present

## 2021-02-15 DIAGNOSIS — R7989 Other specified abnormal findings of blood chemistry: Secondary | ICD-10-CM | POA: Diagnosis not present

## 2021-02-15 DIAGNOSIS — Z0000 Encounter for general adult medical examination without abnormal findings: Secondary | ICD-10-CM | POA: Diagnosis not present

## 2021-03-11 DIAGNOSIS — K3189 Other diseases of stomach and duodenum: Secondary | ICD-10-CM | POA: Diagnosis not present

## 2021-03-11 DIAGNOSIS — Z86010 Personal history of colonic polyps: Secondary | ICD-10-CM | POA: Diagnosis not present

## 2021-03-11 DIAGNOSIS — K2900 Acute gastritis without bleeding: Secondary | ICD-10-CM | POA: Diagnosis not present

## 2021-03-11 DIAGNOSIS — K640 First degree hemorrhoids: Secondary | ICD-10-CM | POA: Diagnosis not present

## 2021-03-11 DIAGNOSIS — K219 Gastro-esophageal reflux disease without esophagitis: Secondary | ICD-10-CM | POA: Diagnosis not present

## 2021-03-11 DIAGNOSIS — K2289 Other specified disease of esophagus: Secondary | ICD-10-CM | POA: Diagnosis not present

## 2021-03-11 DIAGNOSIS — K319 Disease of stomach and duodenum, unspecified: Secondary | ICD-10-CM | POA: Diagnosis not present

## 2021-03-11 DIAGNOSIS — D122 Benign neoplasm of ascending colon: Secondary | ICD-10-CM | POA: Diagnosis not present

## 2021-03-11 DIAGNOSIS — R12 Heartburn: Secondary | ICD-10-CM | POA: Diagnosis not present

## 2021-03-11 DIAGNOSIS — R1310 Dysphagia, unspecified: Secondary | ICD-10-CM | POA: Diagnosis not present

## 2021-03-16 DIAGNOSIS — K219 Gastro-esophageal reflux disease without esophagitis: Secondary | ICD-10-CM | POA: Diagnosis not present

## 2021-03-16 DIAGNOSIS — D122 Benign neoplasm of ascending colon: Secondary | ICD-10-CM | POA: Diagnosis not present

## 2021-03-16 DIAGNOSIS — K319 Disease of stomach and duodenum, unspecified: Secondary | ICD-10-CM | POA: Diagnosis not present

## 2021-06-13 DIAGNOSIS — R059 Cough, unspecified: Secondary | ICD-10-CM | POA: Diagnosis not present

## 2021-06-13 DIAGNOSIS — J029 Acute pharyngitis, unspecified: Secondary | ICD-10-CM | POA: Diagnosis not present

## 2021-06-13 DIAGNOSIS — R0982 Postnasal drip: Secondary | ICD-10-CM | POA: Diagnosis not present

## 2021-06-28 DIAGNOSIS — M792 Neuralgia and neuritis, unspecified: Secondary | ICD-10-CM | POA: Diagnosis not present

## 2021-06-28 DIAGNOSIS — G894 Chronic pain syndrome: Secondary | ICD-10-CM | POA: Diagnosis not present

## 2021-06-28 DIAGNOSIS — M25562 Pain in left knee: Secondary | ICD-10-CM | POA: Diagnosis not present

## 2021-06-28 DIAGNOSIS — G8929 Other chronic pain: Secondary | ICD-10-CM | POA: Diagnosis not present

## 2021-06-28 DIAGNOSIS — Z79899 Other long term (current) drug therapy: Secondary | ICD-10-CM | POA: Diagnosis not present

## 2021-06-28 DIAGNOSIS — Z5181 Encounter for therapeutic drug level monitoring: Secondary | ICD-10-CM | POA: Diagnosis not present

## 2021-07-04 DIAGNOSIS — K219 Gastro-esophageal reflux disease without esophagitis: Secondary | ICD-10-CM | POA: Diagnosis not present

## 2021-07-04 DIAGNOSIS — M1990 Unspecified osteoarthritis, unspecified site: Secondary | ICD-10-CM | POA: Diagnosis not present

## 2021-07-04 DIAGNOSIS — E7800 Pure hypercholesterolemia, unspecified: Secondary | ICD-10-CM | POA: Diagnosis not present

## 2021-09-13 DIAGNOSIS — G8929 Other chronic pain: Secondary | ICD-10-CM | POA: Diagnosis not present

## 2021-09-13 DIAGNOSIS — M1712 Unilateral primary osteoarthritis, left knee: Secondary | ICD-10-CM | POA: Diagnosis not present

## 2021-09-13 DIAGNOSIS — M792 Neuralgia and neuritis, unspecified: Secondary | ICD-10-CM | POA: Diagnosis not present

## 2021-09-13 DIAGNOSIS — G5780 Other specified mononeuropathies of unspecified lower limb: Secondary | ICD-10-CM | POA: Diagnosis not present

## 2021-09-13 DIAGNOSIS — M25562 Pain in left knee: Secondary | ICD-10-CM | POA: Diagnosis not present

## 2021-09-22 DIAGNOSIS — Z0100 Encounter for examination of eyes and vision without abnormal findings: Secondary | ICD-10-CM | POA: Diagnosis not present

## 2021-09-22 DIAGNOSIS — H2513 Age-related nuclear cataract, bilateral: Secondary | ICD-10-CM | POA: Diagnosis not present

## 2021-09-22 DIAGNOSIS — H40013 Open angle with borderline findings, low risk, bilateral: Secondary | ICD-10-CM | POA: Diagnosis not present

## 2021-10-04 DIAGNOSIS — K219 Gastro-esophageal reflux disease without esophagitis: Secondary | ICD-10-CM | POA: Diagnosis not present

## 2021-10-04 DIAGNOSIS — M1990 Unspecified osteoarthritis, unspecified site: Secondary | ICD-10-CM | POA: Diagnosis not present

## 2021-10-04 DIAGNOSIS — E7800 Pure hypercholesterolemia, unspecified: Secondary | ICD-10-CM | POA: Diagnosis not present

## 2021-11-11 DIAGNOSIS — Z1231 Encounter for screening mammogram for malignant neoplasm of breast: Secondary | ICD-10-CM | POA: Diagnosis not present

## 2021-12-13 DIAGNOSIS — G894 Chronic pain syndrome: Secondary | ICD-10-CM | POA: Diagnosis not present

## 2021-12-13 DIAGNOSIS — Z5181 Encounter for therapeutic drug level monitoring: Secondary | ICD-10-CM | POA: Diagnosis not present

## 2021-12-13 DIAGNOSIS — M25562 Pain in left knee: Secondary | ICD-10-CM | POA: Diagnosis not present

## 2021-12-13 DIAGNOSIS — M1712 Unilateral primary osteoarthritis, left knee: Secondary | ICD-10-CM | POA: Diagnosis not present

## 2021-12-13 DIAGNOSIS — Z96659 Presence of unspecified artificial knee joint: Secondary | ICD-10-CM | POA: Diagnosis not present

## 2021-12-13 DIAGNOSIS — Z79899 Other long term (current) drug therapy: Secondary | ICD-10-CM | POA: Diagnosis not present

## 2022-02-14 DIAGNOSIS — R7989 Other specified abnormal findings of blood chemistry: Secondary | ICD-10-CM | POA: Diagnosis not present

## 2022-02-14 DIAGNOSIS — E7800 Pure hypercholesterolemia, unspecified: Secondary | ICD-10-CM | POA: Diagnosis not present

## 2022-02-14 DIAGNOSIS — I10 Essential (primary) hypertension: Secondary | ICD-10-CM | POA: Diagnosis not present

## 2022-02-14 DIAGNOSIS — Z0000 Encounter for general adult medical examination without abnormal findings: Secondary | ICD-10-CM | POA: Diagnosis not present

## 2022-02-14 DIAGNOSIS — R7303 Prediabetes: Secondary | ICD-10-CM | POA: Diagnosis not present

## 2022-02-21 DIAGNOSIS — Z0000 Encounter for general adult medical examination without abnormal findings: Secondary | ICD-10-CM | POA: Diagnosis not present

## 2022-02-21 DIAGNOSIS — K219 Gastro-esophageal reflux disease without esophagitis: Secondary | ICD-10-CM | POA: Diagnosis not present

## 2022-02-21 DIAGNOSIS — H6120 Impacted cerumen, unspecified ear: Secondary | ICD-10-CM | POA: Diagnosis not present

## 2022-02-21 DIAGNOSIS — R7303 Prediabetes: Secondary | ICD-10-CM | POA: Diagnosis not present

## 2022-03-08 DIAGNOSIS — M792 Neuralgia and neuritis, unspecified: Secondary | ICD-10-CM | POA: Diagnosis not present

## 2022-03-08 DIAGNOSIS — G8929 Other chronic pain: Secondary | ICD-10-CM | POA: Diagnosis not present

## 2022-03-08 DIAGNOSIS — M25562 Pain in left knee: Secondary | ICD-10-CM | POA: Diagnosis not present

## 2022-04-26 DIAGNOSIS — R0781 Pleurodynia: Secondary | ICD-10-CM | POA: Diagnosis not present

## 2022-05-31 DIAGNOSIS — M1712 Unilateral primary osteoarthritis, left knee: Secondary | ICD-10-CM | POA: Diagnosis not present

## 2022-05-31 DIAGNOSIS — G8929 Other chronic pain: Secondary | ICD-10-CM | POA: Diagnosis not present

## 2022-05-31 DIAGNOSIS — M25562 Pain in left knee: Secondary | ICD-10-CM | POA: Diagnosis not present

## 2022-05-31 DIAGNOSIS — M792 Neuralgia and neuritis, unspecified: Secondary | ICD-10-CM | POA: Diagnosis not present

## 2022-06-02 DIAGNOSIS — R590 Localized enlarged lymph nodes: Secondary | ICD-10-CM | POA: Diagnosis not present

## 2022-06-05 ENCOUNTER — Other Ambulatory Visit: Payer: Self-pay | Admitting: Family Medicine

## 2022-06-05 DIAGNOSIS — G8918 Other acute postprocedural pain: Secondary | ICD-10-CM | POA: Diagnosis not present

## 2022-06-05 DIAGNOSIS — M792 Neuralgia and neuritis, unspecified: Secondary | ICD-10-CM | POA: Diagnosis not present

## 2022-06-05 DIAGNOSIS — G894 Chronic pain syndrome: Secondary | ICD-10-CM | POA: Diagnosis not present

## 2022-06-05 DIAGNOSIS — R590 Localized enlarged lymph nodes: Secondary | ICD-10-CM

## 2022-06-05 DIAGNOSIS — M25562 Pain in left knee: Secondary | ICD-10-CM | POA: Diagnosis not present

## 2022-06-06 ENCOUNTER — Ambulatory Visit
Admission: RE | Admit: 2022-06-06 | Discharge: 2022-06-06 | Disposition: A | Discharge status: Home / Self Care | Payer: Medicare HMO | Source: Ambulatory Visit | Attending: Family Medicine | Admitting: Family Medicine

## 2022-06-06 DIAGNOSIS — R590 Localized enlarged lymph nodes: Secondary | ICD-10-CM

## 2022-07-11 DIAGNOSIS — M792 Neuralgia and neuritis, unspecified: Secondary | ICD-10-CM | POA: Diagnosis not present

## 2022-07-11 DIAGNOSIS — G894 Chronic pain syndrome: Secondary | ICD-10-CM | POA: Diagnosis not present

## 2022-08-16 DIAGNOSIS — M792 Neuralgia and neuritis, unspecified: Secondary | ICD-10-CM | POA: Diagnosis not present

## 2022-08-16 DIAGNOSIS — G5792 Unspecified mononeuropathy of left lower limb: Secondary | ICD-10-CM | POA: Diagnosis not present

## 2022-08-16 DIAGNOSIS — G8929 Other chronic pain: Secondary | ICD-10-CM | POA: Diagnosis not present

## 2022-08-16 DIAGNOSIS — G8918 Other acute postprocedural pain: Secondary | ICD-10-CM | POA: Diagnosis not present

## 2022-10-02 ENCOUNTER — Ambulatory Visit (INDEPENDENT_AMBULATORY_CARE_PROVIDER_SITE_OTHER): Payer: Medicare HMO

## 2022-10-02 ENCOUNTER — Encounter: Payer: Self-pay | Admitting: Podiatry

## 2022-10-02 ENCOUNTER — Ambulatory Visit: Payer: Medicare HMO | Admitting: Podiatry

## 2022-10-02 DIAGNOSIS — D3610 Benign neoplasm of peripheral nerves and autonomic nervous system, unspecified: Secondary | ICD-10-CM

## 2022-10-02 DIAGNOSIS — M778 Other enthesopathies, not elsewhere classified: Secondary | ICD-10-CM | POA: Diagnosis not present

## 2022-10-02 MED ORDER — TRIAMCINOLONE ACETONIDE 10 MG/ML IJ SUSP
10.0000 mg | Freq: Once | INTRAMUSCULAR | Status: AC
  Administered 2022-10-02: 10 mg

## 2022-10-02 NOTE — Progress Notes (Signed)
Subjective:   Patient ID: [name], female   DOB: 72 y.o.   MRN: [id number]   HPI Patient states she has had a lot of pain in her right forefoot for around 8 months and she is very active and likes to do different sports mostly golf tennis and has had 3 knee replacements and numerous knee surgeries left over right.  Patient does not smoke extremely active   Review of Systems  All other systems reviewed and are negative.       Objective:  Physical Exam Vitals and nursing note reviewed.  Constitutional:      Appearance: She is well-developed.  Pulmonary:     Effort: Pulmonary effort is normal.  Musculoskeletal:        General: Normal range of motion.  Skin:    General: Skin is warm.  Neurological:     Mental Status: She is alert.     Neurovascular status intact muscle strength adequate range of motion within normal limits with exquisite discomfort fourth metatarsal phalangeal joint right fluid buildup mild discomfort third interspace with positive Mulder sign noted third interspace right.  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

## 2022-10-03 DIAGNOSIS — L7634 Postprocedural seroma of skin and subcutaneous tissue following other procedure: Secondary | ICD-10-CM | POA: Diagnosis not present

## 2022-10-03 DIAGNOSIS — G894 Chronic pain syndrome: Secondary | ICD-10-CM | POA: Diagnosis not present

## 2022-10-03 DIAGNOSIS — M792 Neuralgia and neuritis, unspecified: Secondary | ICD-10-CM | POA: Diagnosis not present

## 2022-10-11 DIAGNOSIS — H40013 Open angle with borderline findings, low risk, bilateral: Secondary | ICD-10-CM | POA: Diagnosis not present

## 2022-10-11 DIAGNOSIS — H2513 Age-related nuclear cataract, bilateral: Secondary | ICD-10-CM | POA: Diagnosis not present

## 2022-10-13 DIAGNOSIS — M62830 Muscle spasm of back: Secondary | ICD-10-CM | POA: Diagnosis not present

## 2022-10-16 ENCOUNTER — Ambulatory Visit: Payer: Medicare HMO | Admitting: Podiatry

## 2022-10-16 ENCOUNTER — Encounter: Payer: Self-pay | Admitting: Podiatry

## 2022-10-16 DIAGNOSIS — D3610 Benign neoplasm of peripheral nerves and autonomic nervous system, unspecified: Secondary | ICD-10-CM

## 2022-10-16 DIAGNOSIS — M778 Other enthesopathies, not elsewhere classified: Secondary | ICD-10-CM | POA: Diagnosis not present

## 2022-10-16 MED ORDER — MELOXICAM 15 MG PO TABS: 15.0000 mg | ORAL_TABLET | Freq: Every day | ORAL | 2 refills | Status: AC

## 2022-10-16 NOTE — Progress Notes (Signed)
Subjective:   Patient ID: [name], female   DOB: 72 y.o.   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  We did discuss her activity levels her knee issues and change in gait we discussed possible orthotics and shoe gear modifications padding therapy and I do not recommend current injection treatment.  Patient will be seen back we will see how the symptoms respond to conservative plans

## 2022-11-06 DIAGNOSIS — M792 Neuralgia and neuritis, unspecified: Secondary | ICD-10-CM | POA: Diagnosis not present

## 2022-11-06 DIAGNOSIS — M25562 Pain in left knee: Secondary | ICD-10-CM | POA: Diagnosis not present

## 2022-11-06 DIAGNOSIS — Z79899 Other long term (current) drug therapy: Secondary | ICD-10-CM | POA: Diagnosis not present

## 2022-11-06 DIAGNOSIS — G8929 Other chronic pain: Secondary | ICD-10-CM | POA: Diagnosis not present

## 2022-11-06 DIAGNOSIS — Z5181 Encounter for therapeutic drug level monitoring: Secondary | ICD-10-CM | POA: Diagnosis not present

## 2022-11-24 DIAGNOSIS — Z1231 Encounter for screening mammogram for malignant neoplasm of breast: Secondary | ICD-10-CM | POA: Diagnosis not present

## 2022-12-26 DIAGNOSIS — M25562 Pain in left knee: Secondary | ICD-10-CM | POA: Diagnosis not present

## 2022-12-26 DIAGNOSIS — G894 Chronic pain syndrome: Secondary | ICD-10-CM | POA: Diagnosis not present

## 2022-12-26 DIAGNOSIS — M792 Neuralgia and neuritis, unspecified: Secondary | ICD-10-CM | POA: Diagnosis not present

## 2023-01-15 DIAGNOSIS — M7918 Myalgia, other site: Secondary | ICD-10-CM | POA: Diagnosis not present

## 2023-02-05 DIAGNOSIS — G5761 Lesion of plantar nerve, right lower limb: Secondary | ICD-10-CM | POA: Diagnosis not present

## 2023-02-05 DIAGNOSIS — M792 Neuralgia and neuritis, unspecified: Secondary | ICD-10-CM | POA: Diagnosis not present

## 2023-02-19 DIAGNOSIS — R69 Illness, unspecified: Secondary | ICD-10-CM | POA: Diagnosis not present

## 2023-02-26 DIAGNOSIS — E559 Vitamin D deficiency, unspecified: Secondary | ICD-10-CM | POA: Diagnosis not present

## 2023-02-26 DIAGNOSIS — Z13220 Encounter for screening for lipoid disorders: Secondary | ICD-10-CM | POA: Diagnosis not present

## 2023-02-26 DIAGNOSIS — E7800 Pure hypercholesterolemia, unspecified: Secondary | ICD-10-CM | POA: Diagnosis not present

## 2023-02-26 DIAGNOSIS — Z79899 Other long term (current) drug therapy: Secondary | ICD-10-CM | POA: Diagnosis not present

## 2023-03-19 ENCOUNTER — Other Ambulatory Visit (HOSPITAL_COMMUNITY): Payer: Self-pay | Admitting: Family Medicine

## 2023-03-19 DIAGNOSIS — Z79899 Other long term (current) drug therapy: Secondary | ICD-10-CM | POA: Diagnosis not present

## 2023-03-19 DIAGNOSIS — R7989 Other specified abnormal findings of blood chemistry: Secondary | ICD-10-CM | POA: Diagnosis not present

## 2023-03-19 DIAGNOSIS — H532 Diplopia: Secondary | ICD-10-CM | POA: Diagnosis not present

## 2023-03-19 DIAGNOSIS — H2513 Age-related nuclear cataract, bilateral: Secondary | ICD-10-CM | POA: Diagnosis not present

## 2023-03-19 DIAGNOSIS — G894 Chronic pain syndrome: Secondary | ICD-10-CM | POA: Diagnosis not present

## 2023-03-19 DIAGNOSIS — Z0000 Encounter for general adult medical examination without abnormal findings: Secondary | ICD-10-CM | POA: Diagnosis not present

## 2023-03-19 DIAGNOSIS — M7918 Myalgia, other site: Secondary | ICD-10-CM | POA: Diagnosis not present

## 2023-03-19 DIAGNOSIS — R7303 Prediabetes: Secondary | ICD-10-CM | POA: Diagnosis not present

## 2023-03-19 DIAGNOSIS — K219 Gastro-esophageal reflux disease without esophagitis: Secondary | ICD-10-CM | POA: Diagnosis not present

## 2023-03-19 DIAGNOSIS — H25041 Posterior subcapsular polar age-related cataract, right eye: Secondary | ICD-10-CM | POA: Diagnosis not present

## 2023-03-19 DIAGNOSIS — H5319 Other subjective visual disturbances: Secondary | ICD-10-CM | POA: Diagnosis not present

## 2023-03-19 DIAGNOSIS — E7800 Pure hypercholesterolemia, unspecified: Secondary | ICD-10-CM | POA: Diagnosis not present

## 2023-03-19 DIAGNOSIS — E559 Vitamin D deficiency, unspecified: Secondary | ICD-10-CM | POA: Diagnosis not present

## 2023-03-19 DIAGNOSIS — H25013 Cortical age-related cataract, bilateral: Secondary | ICD-10-CM | POA: Diagnosis not present

## 2023-03-19 DIAGNOSIS — G8929 Other chronic pain: Secondary | ICD-10-CM | POA: Diagnosis not present

## 2023-03-21 DIAGNOSIS — G5761 Lesion of plantar nerve, right lower limb: Secondary | ICD-10-CM | POA: Diagnosis not present

## 2023-03-21 DIAGNOSIS — M792 Neuralgia and neuritis, unspecified: Secondary | ICD-10-CM | POA: Diagnosis not present

## 2023-03-29 ENCOUNTER — Ambulatory Visit (HOSPITAL_COMMUNITY)
Admission: RE | Admit: 2023-03-29 | Discharge: 2023-03-29 | Disposition: A | Discharge status: Home / Self Care | Payer: Medicare HMO | Source: Ambulatory Visit | Attending: Family Medicine | Admitting: Family Medicine

## 2023-03-29 DIAGNOSIS — E7800 Pure hypercholesterolemia, unspecified: Secondary | ICD-10-CM | POA: Insufficient documentation

## 2023-04-23 DIAGNOSIS — H2512 Age-related nuclear cataract, left eye: Secondary | ICD-10-CM | POA: Diagnosis not present

## 2023-04-23 DIAGNOSIS — H25811 Combined forms of age-related cataract, right eye: Secondary | ICD-10-CM | POA: Diagnosis not present

## 2023-05-08 DIAGNOSIS — Z01818 Encounter for other preprocedural examination: Secondary | ICD-10-CM | POA: Diagnosis not present

## 2023-05-08 DIAGNOSIS — H25811 Combined forms of age-related cataract, right eye: Secondary | ICD-10-CM | POA: Diagnosis not present

## 2023-05-30 DIAGNOSIS — H25811 Combined forms of age-related cataract, right eye: Secondary | ICD-10-CM | POA: Diagnosis not present

## 2023-06-12 DIAGNOSIS — Z79899 Other long term (current) drug therapy: Secondary | ICD-10-CM | POA: Diagnosis not present

## 2023-06-12 DIAGNOSIS — E7800 Pure hypercholesterolemia, unspecified: Secondary | ICD-10-CM | POA: Diagnosis not present

## 2023-06-13 DIAGNOSIS — H2512 Age-related nuclear cataract, left eye: Secondary | ICD-10-CM | POA: Diagnosis not present

## 2023-07-09 DIAGNOSIS — R109 Unspecified abdominal pain: Secondary | ICD-10-CM | POA: Diagnosis not present

## 2023-07-09 DIAGNOSIS — E7800 Pure hypercholesterolemia, unspecified: Secondary | ICD-10-CM | POA: Diagnosis not present

## 2023-07-18 DIAGNOSIS — H524 Presbyopia: Secondary | ICD-10-CM | POA: Diagnosis not present

## 2023-07-24 DIAGNOSIS — M792 Neuralgia and neuritis, unspecified: Secondary | ICD-10-CM | POA: Diagnosis not present

## 2023-07-24 DIAGNOSIS — G894 Chronic pain syndrome: Secondary | ICD-10-CM | POA: Diagnosis not present

## 2023-08-21 DIAGNOSIS — E7800 Pure hypercholesterolemia, unspecified: Secondary | ICD-10-CM | POA: Diagnosis not present

## 2023-10-30 DIAGNOSIS — K080 Exfoliation of teeth due to systemic causes: Secondary | ICD-10-CM | POA: Diagnosis not present

## 2023-11-07 DIAGNOSIS — M792 Neuralgia and neuritis, unspecified: Secondary | ICD-10-CM | POA: Diagnosis not present

## 2023-11-30 DIAGNOSIS — Z1231 Encounter for screening mammogram for malignant neoplasm of breast: Secondary | ICD-10-CM | POA: Diagnosis not present

## 2024-01-29 DIAGNOSIS — R0781 Pleurodynia: Secondary | ICD-10-CM | POA: Diagnosis not present

## 2024-02-06 DIAGNOSIS — H40013 Open angle with borderline findings, low risk, bilateral: Secondary | ICD-10-CM | POA: Diagnosis not present

## 2024-02-06 DIAGNOSIS — H16221 Keratoconjunctivitis sicca, not specified as Sjogren's, right eye: Secondary | ICD-10-CM | POA: Diagnosis not present

## 2024-02-06 DIAGNOSIS — H5211 Myopia, right eye: Secondary | ICD-10-CM | POA: Diagnosis not present

## 2024-03-13 DIAGNOSIS — E559 Vitamin D deficiency, unspecified: Secondary | ICD-10-CM | POA: Diagnosis not present

## 2024-03-13 DIAGNOSIS — R7989 Other specified abnormal findings of blood chemistry: Secondary | ICD-10-CM | POA: Diagnosis not present

## 2024-03-13 DIAGNOSIS — R7303 Prediabetes: Secondary | ICD-10-CM | POA: Diagnosis not present

## 2024-03-13 DIAGNOSIS — Z79899 Other long term (current) drug therapy: Secondary | ICD-10-CM | POA: Diagnosis not present

## 2024-03-13 DIAGNOSIS — K219 Gastro-esophageal reflux disease without esophagitis: Secondary | ICD-10-CM | POA: Diagnosis not present

## 2024-03-13 DIAGNOSIS — E7800 Pure hypercholesterolemia, unspecified: Secondary | ICD-10-CM | POA: Diagnosis not present

## 2024-03-18 DIAGNOSIS — G894 Chronic pain syndrome: Secondary | ICD-10-CM | POA: Diagnosis not present

## 2024-03-18 DIAGNOSIS — M792 Neuralgia and neuritis, unspecified: Secondary | ICD-10-CM | POA: Diagnosis not present

## 2024-03-18 DIAGNOSIS — G588 Other specified mononeuropathies: Secondary | ICD-10-CM | POA: Diagnosis not present

## 2024-05-22 ENCOUNTER — Other Ambulatory Visit (HOSPITAL_BASED_OUTPATIENT_CLINIC_OR_DEPARTMENT_OTHER): Payer: Self-pay | Admitting: Family Medicine

## 2024-05-22 DIAGNOSIS — E7800 Pure hypercholesterolemia, unspecified: Secondary | ICD-10-CM

## 2024-05-22 DIAGNOSIS — Z0000 Encounter for general adult medical examination without abnormal findings: Secondary | ICD-10-CM | POA: Diagnosis not present

## 2024-05-22 DIAGNOSIS — K219 Gastro-esophageal reflux disease without esophagitis: Secondary | ICD-10-CM | POA: Diagnosis not present

## 2024-05-22 DIAGNOSIS — G8929 Other chronic pain: Secondary | ICD-10-CM | POA: Diagnosis not present

## 2024-05-22 DIAGNOSIS — R7303 Prediabetes: Secondary | ICD-10-CM | POA: Diagnosis not present

## 2024-06-10 DIAGNOSIS — M792 Neuralgia and neuritis, unspecified: Secondary | ICD-10-CM | POA: Diagnosis not present

## 2024-06-10 DIAGNOSIS — G894 Chronic pain syndrome: Secondary | ICD-10-CM | POA: Diagnosis not present

## 2024-06-10 DIAGNOSIS — M7918 Myalgia, other site: Secondary | ICD-10-CM | POA: Diagnosis not present

## 2024-06-10 DIAGNOSIS — Z5181 Encounter for therapeutic drug level monitoring: Secondary | ICD-10-CM | POA: Diagnosis not present

## 2024-06-10 DIAGNOSIS — M25562 Pain in left knee: Secondary | ICD-10-CM | POA: Diagnosis not present

## 2024-06-10 DIAGNOSIS — Z79899 Other long term (current) drug therapy: Secondary | ICD-10-CM | POA: Diagnosis not present
